# Patient Record
Sex: Male | Born: 1960 | Race: Black or African American | Hispanic: No | Marital: Single | Smoking: Never smoker
Health system: Southern US, Community
[De-identification: ages and names within clinical notes are randomized; demographics above are authoritative.]

## PROBLEM LIST (undated history)

## (undated) DIAGNOSIS — I10 Essential (primary) hypertension: Secondary | ICD-10-CM

## (undated) DIAGNOSIS — I2510 Atherosclerotic heart disease of native coronary artery without angina pectoris: Secondary | ICD-10-CM

## (undated) DIAGNOSIS — S83289A Other tear of lateral meniscus, current injury, unspecified knee, initial encounter: Secondary | ICD-10-CM

## (undated) DIAGNOSIS — M722 Plantar fascial fibromatosis: Secondary | ICD-10-CM

## (undated) DIAGNOSIS — E119 Type 2 diabetes mellitus without complications: Secondary | ICD-10-CM

## (undated) DIAGNOSIS — C801 Malignant (primary) neoplasm, unspecified: Secondary | ICD-10-CM

## (undated) DIAGNOSIS — I509 Heart failure, unspecified: Secondary | ICD-10-CM

## (undated) DIAGNOSIS — E785 Hyperlipidemia, unspecified: Secondary | ICD-10-CM

## (undated) HISTORY — PX: SHOULDER ARTHROSCOPY: SHX128

## (undated) HISTORY — PX: TESTICLE SURGERY: SHX794

## (undated) HISTORY — PX: ANGIOPLASTY: SHX39

## (undated) HISTORY — DX: Essential (primary) hypertension: I10

## (undated) HISTORY — DX: Hyperlipidemia, unspecified: E78.5

## (undated) HISTORY — DX: Heart failure, unspecified: I50.9

---

## 1998-09-16 ENCOUNTER — Emergency Department (HOSPITAL_COMMUNITY): Admission: EM | Admit: 1998-09-16 | Discharge: 1998-09-16 | Payer: Self-pay | Admitting: Emergency Medicine

## 1998-09-30 ENCOUNTER — Encounter: Payer: Self-pay | Admitting: Cardiology

## 1998-09-30 ENCOUNTER — Ambulatory Visit (HOSPITAL_COMMUNITY): Admission: RE | Admit: 1998-09-30 | Discharge: 1998-09-30 | Payer: Self-pay | Admitting: Cardiology

## 1998-10-06 ENCOUNTER — Ambulatory Visit (HOSPITAL_COMMUNITY): Admission: RE | Admit: 1998-10-06 | Discharge: 1998-10-06 | Payer: Self-pay | Admitting: Cardiology

## 1998-12-30 ENCOUNTER — Observation Stay (HOSPITAL_COMMUNITY): Admission: RE | Admit: 1998-12-30 | Discharge: 1998-12-31 | Payer: Self-pay | Admitting: Otolaryngology

## 1999-01-24 ENCOUNTER — Encounter: Payer: Self-pay | Admitting: Emergency Medicine

## 1999-01-24 ENCOUNTER — Emergency Department (HOSPITAL_COMMUNITY): Admission: EM | Admit: 1999-01-24 | Discharge: 1999-01-24 | Payer: Self-pay | Admitting: Emergency Medicine

## 1999-04-23 ENCOUNTER — Emergency Department (HOSPITAL_COMMUNITY): Admission: EM | Admit: 1999-04-23 | Discharge: 1999-04-23 | Payer: Self-pay | Admitting: Emergency Medicine

## 1999-04-23 ENCOUNTER — Encounter: Payer: Self-pay | Admitting: Emergency Medicine

## 2002-03-03 ENCOUNTER — Encounter: Payer: Self-pay | Admitting: Emergency Medicine

## 2002-03-03 ENCOUNTER — Emergency Department (HOSPITAL_COMMUNITY): Admission: EM | Admit: 2002-03-03 | Discharge: 2002-03-03 | Payer: Self-pay | Admitting: Emergency Medicine

## 2002-03-06 ENCOUNTER — Emergency Department (HOSPITAL_COMMUNITY): Admission: EM | Admit: 2002-03-06 | Discharge: 2002-03-06 | Payer: Self-pay | Admitting: Emergency Medicine

## 2002-03-30 ENCOUNTER — Ambulatory Visit (HOSPITAL_COMMUNITY): Admission: RE | Admit: 2002-03-30 | Discharge: 2002-03-30 | Payer: Self-pay | Admitting: Orthopedic Surgery

## 2002-03-30 ENCOUNTER — Encounter: Payer: Self-pay | Admitting: Orthopedic Surgery

## 2002-04-09 ENCOUNTER — Encounter: Payer: Self-pay | Admitting: Orthopedic Surgery

## 2002-04-11 ENCOUNTER — Inpatient Hospital Stay (HOSPITAL_COMMUNITY): Admission: AD | Admit: 2002-04-11 | Discharge: 2002-04-12 | Payer: Self-pay | Admitting: Orthopedic Surgery

## 2002-04-22 ENCOUNTER — Emergency Department (HOSPITAL_COMMUNITY): Admission: EM | Admit: 2002-04-22 | Discharge: 2002-04-22 | Payer: Self-pay | Admitting: Emergency Medicine

## 2002-06-04 ENCOUNTER — Encounter: Admission: RE | Admit: 2002-06-04 | Discharge: 2002-09-02 | Payer: Self-pay | Admitting: Orthopedic Surgery

## 2014-05-25 ENCOUNTER — Encounter (HOSPITAL_COMMUNITY): Payer: Self-pay | Admitting: Emergency Medicine

## 2014-05-25 ENCOUNTER — Emergency Department (INDEPENDENT_AMBULATORY_CARE_PROVIDER_SITE_OTHER)
Admission: EM | Admit: 2014-05-25 | Discharge: 2014-05-25 | Disposition: A | Discharge status: Home / Self Care | Payer: BC Managed Care – PPO | Source: Home / Self Care | Attending: Emergency Medicine | Admitting: Emergency Medicine

## 2014-05-25 DIAGNOSIS — K047 Periapical abscess without sinus: Secondary | ICD-10-CM

## 2014-05-25 DIAGNOSIS — K044 Acute apical periodontitis of pulpal origin: Secondary | ICD-10-CM

## 2014-05-25 DIAGNOSIS — K0889 Other specified disorders of teeth and supporting structures: Secondary | ICD-10-CM

## 2014-05-25 DIAGNOSIS — K089 Disorder of teeth and supporting structures, unspecified: Secondary | ICD-10-CM

## 2014-05-25 MED ORDER — CLINDAMYCIN HCL 300 MG PO CAPS: 300.0000 mg | ORAL_CAPSULE | Freq: Three times a day (TID) | ORAL | Status: DC

## 2014-05-25 MED ORDER — HYDROCODONE-ACETAMINOPHEN 5-325 MG PO TABS: 1.0000 | ORAL_TABLET | Freq: Four times a day (QID) | ORAL | Status: DC | PRN

## 2014-05-25 MED ORDER — IBUPROFEN 800 MG PO TABS: 800.0000 mg | ORAL_TABLET | Freq: Three times a day (TID) | ORAL | Status: DC

## 2014-05-25 NOTE — ED Notes (Signed)
C/o dental pain States right top tooth filling did fall out States tooth is sensitive to hot/cold  No appt with dentist States there is pressure  Motrin and numbing meds used as tx

## 2014-05-25 NOTE — ED Provider Notes (Signed)
CSN: [id number]     Arrival date & time 05/25/14  1001 History   None    Chief Complaint  Patient presents with  . Dental Pain   (Consider location/radiation/quality/duration/timing/severity/associated sxs/prior Treatment) HPI Comments: 53 year old male presents for evaluation of dental pain. A couple months ago he had a filling fall out of his tooth. 5 days ago he started to have pain around this tooth. In the last 5 days, the pain has gotten progressively worse and now radiates up to his ear and along his jaw. The pain is severe, 8-9/10 he has some swelling in his gums and cheek as well. He has not had any purulent drainage into his mouth. He does not have a dentist. No fever. He is able to open his mouth fully  Patient is a 53 y.o. male presenting with tooth pain.  Dental Pain Associated symptoms: facial swelling     History reviewed. No pertinent past medical history. No past surgical history on file. No family history on file. History  Substance Use Topics  . Smoking status: Not on file  . Smokeless tobacco: Not on file  . Alcohol Use: Not on file    Review of Systems  HENT: Positive for dental problem and facial swelling.   All other systems reviewed and are negative.   Allergies  Review of patient's allergies indicates no known allergies.  Home Medications   Prior to Admission medications   Medication Sig Start Date End Date Taking? Authorizing Provider  clindamycin (CLEOCIN) 300 MG capsule Take 1 capsule (300 mg total) by mouth 3 (three) times daily. 05/25/14   Liam Graham, PA-C  HYDROcodone-acetaminophen (NORCO) 5-325 MG per tablet Take 1 tablet by mouth every 6 (six) hours as needed for moderate pain. 05/25/14   Liam Graham, PA-C  ibuprofen (ADVIL,MOTRIN) 800 MG tablet Take 1 tablet (800 mg total) by mouth 3 (three) times daily. 05/25/14   Freeman Caldron Colie Josten, PA-C   BP 137/88  Pulse 87  Temp(Src) 98.5 F (36.9 C) (Oral)  Resp 18  SpO2 96% Physical Exam   Nursing note and vitals reviewed. Constitutional: He is oriented to person, place, and time. He appears well-developed and well-nourished. No distress.  HENT:  Head: Normocephalic.  Mouth/Throat: Uvula is midline, oropharynx is clear and moist and mucous membranes are normal. No trismus in the jaw. Dental caries (right upper jaw, multiple teeth, the gums are erythematous and swollen) present. No dental abscesses.  No sublingual edema  Pulmonary/Chest: Effort normal. No respiratory distress.  Neurological: He is alert and oriented to person, place, and time. Coordination normal.  Skin: Skin is warm and dry. No rash noted. He is not diaphoretic.  Psychiatric: He has a normal mood and affect. Judgment normal.    ED Course  Procedures (including critical care time) Labs Review Labs Reviewed - No data to display  Imaging Review No results found.   MDM   1. Toothache   2. Dental infection    We'll treat with clindamycin, ibuprofen, Norco. Followup with dentist   Meds ordered this encounter  Medications  . clindamycin (CLEOCIN) 300 MG capsule    Sig: Take 1 capsule (300 mg total) by mouth 3 (three) times daily.    Dispense:  42 capsule    Refill:  0    Order Specific Question:  Supervising Provider    Answer:  Jake Michaelis, DAVID C [id number]  . ibuprofen (ADVIL,MOTRIN) 800 MG tablet    Sig: Take 1 tablet (800 mg total)  by mouth 3 (three) times daily.    Dispense:  60 tablet    Refill:  0    Order Specific Question:  Supervising Provider    Answer:  Jake Michaelis, DAVID C [id number]  . HYDROcodone-acetaminophen (NORCO) 5-325 MG per tablet    Sig: Take 1 tablet by mouth every 6 (six) hours as needed for moderate pain.    Dispense:  15 tablet    Refill:  0    Order Specific Question:  Supervising Provider    Answer:  Jake Michaelis, DAVID C [[id number]]       Liam Graham, PA-C 05/25/14 1053

## 2014-05-25 NOTE — ED Provider Notes (Signed)
Medical screening examination/treatment/procedure(s) were performed by non-physician practitioner and as supervising physician I was immediately available for consultation/collaboration.  Philipp Deputy, M.D.  Harden Mo, MD 05/25/14 [phone]

## 2014-05-25 NOTE — Discharge Instructions (Signed)
Periodontal Disease Periodontal disease, or gum disease, is a type of oral disease that affects the surrounding and supporting tissues of the teeth. These include the gums (gingivae), ligaments, and tooth socket (alveolar bone). Periodontal disease can affect one tooth or many teeth. If left untreated, it may lead to tooth loss.  CAUSES The main cause of periodontal disease is dental plaque, which contains harmful bacteria. These bacteria can cause the gums to become inflamed and infected. Further progression of the disease can damage the other supporting tissues.  RISK FACTORS  Diabetes.   Smoking and tobacco use.   Genetics.   Hormonal changes of puberty, menopause, and pregnancy.   Stress.   Clenching or grinding your teeth.   Substance abuse.  Poor nutrition.   Diseases that interfere with the body's immune system.   Certain medicines. SIGNS AND SYMPTOMS  Red or swollen gums.  Bad breath that does not go away.  Gums that have pulled away from the teeth.  Gums that bleed easily.  Permanent teeth that are loose or separating.  Pain when chewing.  Changes in the way your teeth fit together.  Sensitive teeth. DIAGNOSIS  A thorough examination of the periodontal tissues will be done by your dentist. X-rays may be needed. Evaluation of your medical history will be needed to see if there are other factors or underlying conditions that may contribute to the disease. TREATMENT The number and types of treatment will vary depending on the extent of the disease. Treatment may include brushing and flossing only. Further disease progression may necessitate scaling and root planing or even surgery. The main goal is to control the infection. Good oral hygiene at home is necessary for the success of all types of treatment. HOME CARE INSTRUCTIONS   Practice good oral hygiene. This includes flossing and brushing your teeth every day.   See your dentist regularly, at least  2 times per year.   Stop smoking if you smoke.  Eat a well-balanced diet. SEEK IMMEDIATE DENTAL CARE IF:   You have any signs or symptoms of periodontal disease along with:  Swelling of your face, neck, or jaw.  Inability to open your mouth.  Severe pain uncontrolled by pain medicine.  You have a fever or persistent symptoms for more than 2 3 days.  You have a fever and your symptoms suddenly get worse. Document Released: 12/01/2003 Document Revised: 07/31/2013 Document Reviewed: 05/07/2013 [facility] Patient Information 2014 [place].  Dental Pain A tooth ache may be caused by cavities (tooth decay). Cavities expose the nerve of the tooth to air and hot or cold temperatures. It may come from an infection or abscess (also called a boil or furuncle) around your tooth. It is also often caused by dental caries (tooth decay). This causes the pain you are having. DIAGNOSIS  Your caregiver can diagnose this problem by exam. TREATMENT   If caused by an infection, it may be treated with medications which kill germs (antibiotics) and pain medications as prescribed by your caregiver. Take medications as directed.  Only take over-the-counter or prescription medicines for pain, discomfort, or fever as directed by your caregiver.  Whether the tooth ache today is caused by infection or dental disease, you should see your dentist as soon as possible for further care. SEEK MEDICAL CARE IF: The exam and treatment you received today has been provided on an emergency basis only. This is not a substitute for complete medical or dental care. If your problem worsens or new problems (  symptoms) appear, and you are unable to meet with your dentist, call or return to this location. SEEK IMMEDIATE MEDICAL CARE IF:   You have a fever.  You develop redness and swelling of your face, jaw, or neck.  You are unable to open your mouth.  You have severe pain uncontrolled by pain medicine. MAKE SURE  YOU:   Understand these instructions.  Will watch your condition.  Will get help right away if you are not doing well or get worse. Document Released: 11/28/2005 Document Revised: 02/20/2012 Document Reviewed: 07/16/2008 [facility] Patient Information 2014 [place].

## 2016-01-30 ENCOUNTER — Emergency Department (HOSPITAL_COMMUNITY): Payer: Self-pay

## 2016-01-30 ENCOUNTER — Encounter (HOSPITAL_COMMUNITY): Payer: Self-pay

## 2016-01-30 ENCOUNTER — Emergency Department (HOSPITAL_COMMUNITY)
Admission: EM | Admit: 2016-01-30 | Discharge: 2016-01-30 | Disposition: A | Discharge status: Home / Self Care | Payer: Self-pay | Attending: Emergency Medicine | Admitting: Emergency Medicine

## 2016-01-30 DIAGNOSIS — S46222A Laceration of muscle, fascia and tendon of other parts of biceps, left arm, initial encounter: Secondary | ICD-10-CM | POA: Insufficient documentation

## 2016-01-30 DIAGNOSIS — Z792 Long term (current) use of antibiotics: Secondary | ICD-10-CM | POA: Insufficient documentation

## 2016-01-30 DIAGNOSIS — Z9861 Coronary angioplasty status: Secondary | ICD-10-CM | POA: Insufficient documentation

## 2016-01-30 DIAGNOSIS — Z8571 Personal history of Hodgkin lymphoma: Secondary | ICD-10-CM | POA: Insufficient documentation

## 2016-01-30 DIAGNOSIS — Z23 Encounter for immunization: Secondary | ICD-10-CM | POA: Insufficient documentation

## 2016-01-30 DIAGNOSIS — I2510 Atherosclerotic heart disease of native coronary artery without angina pectoris: Secondary | ICD-10-CM | POA: Insufficient documentation

## 2016-01-30 DIAGNOSIS — Y9289 Other specified places as the place of occurrence of the external cause: Secondary | ICD-10-CM | POA: Insufficient documentation

## 2016-01-30 DIAGNOSIS — Z791 Long term (current) use of non-steroidal anti-inflammatories (NSAID): Secondary | ICD-10-CM | POA: Insufficient documentation

## 2016-01-30 DIAGNOSIS — W268XXA Contact with other sharp object(s), not elsewhere classified, initial encounter: Secondary | ICD-10-CM | POA: Insufficient documentation

## 2016-01-30 DIAGNOSIS — Y9389 Activity, other specified: Secondary | ICD-10-CM | POA: Insufficient documentation

## 2016-01-30 DIAGNOSIS — IMO0002 Reserved for concepts with insufficient information to code with codable children: Secondary | ICD-10-CM

## 2016-01-30 DIAGNOSIS — Y998 Other external cause status: Secondary | ICD-10-CM | POA: Insufficient documentation

## 2016-01-30 DIAGNOSIS — Z8739 Personal history of other diseases of the musculoskeletal system and connective tissue: Secondary | ICD-10-CM | POA: Insufficient documentation

## 2016-01-30 DIAGNOSIS — Z859 Personal history of malignant neoplasm, unspecified: Secondary | ICD-10-CM | POA: Insufficient documentation

## 2016-01-30 HISTORY — DX: Plantar fascial fibromatosis: M72.2

## 2016-01-30 HISTORY — DX: Atherosclerotic heart disease of native coronary artery without angina pectoris: I25.10

## 2016-01-30 HISTORY — DX: Malignant (primary) neoplasm, unspecified: C80.1

## 2016-01-30 MED ORDER — TETANUS-DIPHTH-ACELL PERTUSSIS 5-2.5-18.5 LF-MCG/0.5 IM SUSP
0.5000 mL | Freq: Once | INTRAMUSCULAR | Status: AC
  Administered 2016-01-30: 0.5 mL via INTRAMUSCULAR
  Filled 2016-01-30: qty 0.5

## 2016-01-30 MED ORDER — LIDOCAINE-EPINEPHRINE (PF) 2 %-1:200000 IJ SOLN: 10.0000 mL | Freq: Once | INTRAMUSCULAR | Status: DC

## 2016-01-30 MED ORDER — LIDOCAINE-EPINEPHRINE 2 %-1:100000 IJ SOLN
20.0000 mL | Freq: Once | INTRAMUSCULAR | Status: AC
  Administered 2016-01-30: 20 mL
  Filled 2016-01-30: qty 20

## 2016-01-30 NOTE — Discharge Instructions (Signed)
Your sutures will dissolve on their own over time. Return to ED for any new or concerning symptoms such as increased redness, tenderness, swelling or cloudy drainage.

## 2016-01-30 NOTE — ED Notes (Signed)
Declined W/C at D/C and was escorted to lobby by RN. 

## 2016-01-30 NOTE — ED Provider Notes (Signed)
CSN: [id number]     Arrival date & time 01/30/16  1010 History  By signing my name below, I, Andrew Brock, attest that this documentation has been prepared under the direction and in the presence of [employer], PA-C. Electronically Signed: Rayna Brock, ED Scribe. 01/30/2016. 11:42 AM.    Chief Complaint  Patient presents with  . Laceration   The history is provided by the patient. No language interpreter was used.   HPI Comments: Andrew Brock is a 55 y.o. male who presents to the Emergency Department complaining of a moderate laceration with controlled bleeding to the medial aspect of the left bicep which occurred this morning. Pt was sitting in a lawn chair which broke resulting in the laceration but is unsure if the metal or plastic portion of the chair caused the laceration. He confirms a PMHx of Hodgkin's Lymphoma in 2005. His TDAP is not UTD. No fevers, chills, numbness or weakness or decreased range of motion. He denies any other associated symptoms at this time.    Past Medical History  Diagnosis Date  . Cancer ([place])   . Plantar fasciitis   . Coronary artery disease    Past Surgical History  Procedure Laterality Date  . Angioplasty     No family history on file. Social History  Substance Use Topics  . Smoking status: Never Smoker   . Smokeless tobacco: None  . Alcohol Use: No    Review of Systems A complete 10 system review of systems was obtained and all systems are negative except as noted in the HPI and PMH.   Allergies  Review of patient's allergies indicates no known allergies.  Home Medications   Prior to Admission medications   Medication Sig Start Date End Date Taking? Authorizing Provider  clindamycin (CLEOCIN) 300 MG capsule Take 1 capsule (300 mg total) by mouth 3 (three) times daily. 05/25/14   Liam Graham, PA-C  HYDROcodone-acetaminophen (NORCO) 5-325 MG per tablet Take 1 tablet by mouth every 6 (six) hours as needed for moderate pain.  05/25/14   Liam Graham, PA-C  ibuprofen (ADVIL,MOTRIN) 800 MG tablet Take 1 tablet (800 mg total) by mouth 3 (three) times daily. 05/25/14   Freeman Caldron Baker, PA-C   BP 135/92 mmHg  Pulse 62  Temp(Src) 98 F (36.7 C) (Oral)  Resp 18  Wt 138.801 kg  SpO2 98%    Physical Exam  Constitutional: He is oriented to person, place, and time. He appears well-developed and well-nourished.  HENT:  Head: Normocephalic and atraumatic.  Eyes: EOM are normal.  Neck: Normal range of motion.  Cardiovascular: Normal rate.   Pulmonary/Chest: Effort normal. No respiratory distress.  Abdominal: Soft.  Musculoskeletal: Normal range of motion.  Neurological: He is alert and oriented to person, place, and time.  Skin: Skin is warm and dry. Laceration noted.  Medial aspect of LUE in the axillary fold: transverse laceration; approximately 2 inches in length with exposed adipose tissue but no other bony, tendonous or vascular involvement   Psychiatric: He has a normal mood and affect.  Nursing note and vitals reviewed.   ED Course  Procedures  DIAGNOSTIC STUDIES: Oxygen Saturation is 98% on RA, normal by my interpretation.    COORDINATION OF CARE: 10:45 AM Discussed next steps with pt. He verbalized understanding and is agreeable with the plan.  LACERATION REPAIR PROCEDURE NOTE The patient's identification was confirmed and consent was obtained. This procedure was performed by Comer Locket, PA-C at 12:15 PM. Site:  medial aspect of LUE in the axillary fold Sterile procedures observed Anesthetic used (type and amt): 2% lidocaine w/epinephrine; 8 mL Suture type/size: 4-0 Vicryl Length: 2 inches # of Sutures: 6 Technique: simple interrupted  Complexity: complex Antibx ointment applied Tetanus ordered Site anesthetized, irrigated with NS, explored without evidence of foreign body, wound well approximated, site covered with dry, sterile dressing.  Patient tolerated procedure well without  complications. Instructions for care discussed verbally and patient provided with additional written instructions for homecare and f/u.   Labs Review Labs Reviewed - No data to display  Imaging Review Dg Humerus Left  01/30/2016  CLINICAL DATA:  Patient was sitting on plastic lawn chair when it broke and the arm part of the chair caused a deep laceration in the middle part of his upper arm EXAM: LEFT HUMERUS - 2+ VIEW COMPARISON:  None. FINDINGS: No fracture.  No bone lesion. Shoulder and elbow joints are normally aligned. There has been a previous rotator cuff repair. No radiopaque foreign body. IMPRESSION: No fracture or bone lesion. No dislocation. No radiopaque foreign body. Electronically Signed   By: Lajean Manes M.D.   On: 01/30/2016 11:26   I have personally reviewed and evaluated these images as part of my medical decision-making.   EKG Interpretation None      MDM  Tdap booster given.Pressure irrigation performed. Laceration occurred < 8 hours prior to repair which was well tolerated. Pt has no co morbidities to effect normal wound healing. Discussed suture home care w pt and answered questions. Pt to f-u for wound check in 7 days. Pt is hemodynamically stable w no complaints prior to dc.    Final diagnoses:  Laceration    I personally performed the services described in this documentation, which was scribed in my presence. The recorded information has been reviewed and is accurate.    Comer Locket, PA-C 01/30/16 1314  Gareth Morgan, MD 02/01/16 2256

## 2016-01-30 NOTE — ED Notes (Signed)
Pt. Was siting on a lawn chair and it bokre and the plastic arm rest cut the pt. In his r lt. Upper anteror arm laceration noted, bleeding controlled

## 2017-02-07 ENCOUNTER — Emergency Department (HOSPITAL_COMMUNITY)
Admission: EM | Admit: 2017-02-07 | Discharge: 2017-02-07 | Disposition: A | Discharge status: Home / Self Care | Payer: Self-pay | Attending: Emergency Medicine | Admitting: Emergency Medicine

## 2017-02-07 DIAGNOSIS — S39012A Strain of muscle, fascia and tendon of lower back, initial encounter: Secondary | ICD-10-CM

## 2017-02-07 DIAGNOSIS — Y990 Civilian activity done for income or pay: Secondary | ICD-10-CM | POA: Insufficient documentation

## 2017-02-07 DIAGNOSIS — X500XXA Overexertion from strenuous movement or load, initial encounter: Secondary | ICD-10-CM | POA: Insufficient documentation

## 2017-02-07 DIAGNOSIS — Y929 Unspecified place or not applicable: Secondary | ICD-10-CM | POA: Insufficient documentation

## 2017-02-07 DIAGNOSIS — S29012A Strain of muscle and tendon of back wall of thorax, initial encounter: Secondary | ICD-10-CM | POA: Insufficient documentation

## 2017-02-07 DIAGNOSIS — Z859 Personal history of malignant neoplasm, unspecified: Secondary | ICD-10-CM | POA: Insufficient documentation

## 2017-02-07 DIAGNOSIS — I2510 Atherosclerotic heart disease of native coronary artery without angina pectoris: Secondary | ICD-10-CM | POA: Insufficient documentation

## 2017-02-07 DIAGNOSIS — Y9389 Activity, other specified: Secondary | ICD-10-CM | POA: Insufficient documentation

## 2017-02-07 MED ORDER — METHOCARBAMOL 500 MG PO TABS: 1000.0000 mg | ORAL_TABLET | Freq: Four times a day (QID) | ORAL | 0 refills | Status: DC

## 2017-02-07 MED ORDER — KETOROLAC TROMETHAMINE 60 MG/2ML IM SOLN
60.0000 mg | Freq: Once | INTRAMUSCULAR | Status: AC
  Administered 2017-02-07: 60 mg via INTRAMUSCULAR
  Filled 2017-02-07: qty 2

## 2017-02-07 MED ORDER — METHOCARBAMOL 500 MG PO TABS
500.0000 mg | ORAL_TABLET | Freq: Once | ORAL | Status: AC
  Administered 2017-02-07: 500 mg via ORAL
  Filled 2017-02-07: qty 1

## 2017-02-07 NOTE — ED Notes (Signed)
Bed: [id number] Expected date:  Expected time:  Means of arrival:  Comments: EMS- 56yo M, back pain

## 2017-02-07 NOTE — ED Notes (Signed)
Pt ambulatory to bathroom

## 2017-02-07 NOTE — Discharge Instructions (Signed)
Please read and follow all provided instructions.  Your diagnoses today include:  1. Back strain, initial encounter     Tests performed today include:  Vital signs - see below for your results today  Medications prescribed:   Robaxin (methocarbamol) - muscle relaxer medication  DO NOT drive or perform any activities that require you to be awake and alert because this medicine can make you drowsy.   Take any prescribed medications only as directed.  Home care instructions:   Follow any educational materials contained in this packet  Please rest, use ice or heat on your back for the next several days  Do not lift, push, pull anything more than 10 pounds for the next week  Follow-up instructions: Please follow-up with your primary care provider in the next 1 week for further evaluation of your symptoms.   Return instructions:  SEEK IMMEDIATE MEDICAL ATTENTION IF YOU HAVE:  New numbness, tingling, weakness, or problem with the use of your arms or legs  Severe back pain not relieved with medications  Loss control of your bowels or bladder  Increasing pain in any areas of the body (such as chest or abdominal pain)  Shortness of breath, dizziness, or fainting.   Worsening nausea (feeling sick to your stomach), vomiting, fever, or sweats  Any other emergent concerns regarding your health   Additional Information:  Your vital signs today were: BP 137/93 (BP Location: Left Arm)    Pulse 73    Temp 97.8 F (36.6 C) (Oral)    Resp 17    Ht 5\' 11"  (1.803 m)    Wt (!) 139.7 kg    SpO2 94%    BMI 42.96 kg/m  If your blood pressure (BP) was elevated above 135/85 this visit, please have this repeated by your doctor within one month. --------------

## 2017-02-07 NOTE — ED Triage Notes (Signed)
BIB EMS from Work, reporting 10/10 left lower back pain that started while walking. Pt denies recent injury. Pt denies performing strenuous activity during this incident. Pt denies hx of back surgeries. Pt able to stand and pivot from EMS gurney to ED stretcher. Arrives A+OX4, speaking in complete sentences.   EMS Vitals BP 132/94 P 76 RR 20 SPO2 99%

## 2017-02-07 NOTE — ED Provider Notes (Signed)
[facility] DEPT Provider Note   CSN: [id number] Arrival date & time: 02/07/17  0845     History   Chief Complaint Chief Complaint  Patient presents with  . Back Pain    HPI Andrew Brock is a 56 y.o. male.  Patient presents from work with reported severe middle back pain that started acutely while walking. Patient denies history of back surgeries or other problems but states that he had a back strain in his 42s. He coaches boxing at night. He does a lot of lifting at work but nothing over 22 pounds at a time. He also drives a forklift. Pain does not radiate. It is in his left middle back wrapping around to his rib cage. No difficulty breathing or shortness of breath. No cough or fever. Patient denies warning symptoms of back pain including: fecal incontinence, urinary retention or overflow incontinence, night sweats, waking from sleep with back pain, unexplained fevers or weight loss, h/o cancer, IVDU, recent trauma. Transported to the emergency department by EMS. No treatments prior to arrival.      Past Medical History:  Diagnosis Date  . Cancer ([place])   . Coronary artery disease   . Plantar fasciitis     There are no active problems to display for this patient.   Past Surgical History:  Procedure Laterality Date  . ANGIOPLASTY         Home Medications    Prior to Admission medications   Medication Sig Start Date End Date Taking? Authorizing Provider  clindamycin (CLEOCIN) 300 MG capsule Take 1 capsule (300 mg total) by mouth 3 (three) times daily. 05/25/14   Liam Graham, PA-C  HYDROcodone-acetaminophen (NORCO) 5-325 MG per tablet Take 1 tablet by mouth every 6 (six) hours as needed for moderate pain. 05/25/14   Liam Graham, PA-C  ibuprofen (ADVIL,MOTRIN) 800 MG tablet Take 1 tablet (800 mg total) by mouth 3 (three) times daily. 05/25/14   Liam Graham, PA-C    Family History No family history on file.  Social History Social History  Substance  Use Topics  . Smoking status: Never Smoker  . Smokeless tobacco: Not on file  . Alcohol use No     Allergies   Patient has no known allergies.   Review of Systems Review of Systems  Constitutional: Negative for fever and unexpected weight change.  Gastrointestinal: Negative for constipation.       Neg for fecal incontinence  Genitourinary: Negative for difficulty urinating, flank pain and hematuria.       Negative for urinary incontinence or retention  Musculoskeletal: Positive for back pain.  Neurological: Negative for weakness and numbness.       Negative for saddle paresthesias      Physical Exam Updated Vital Signs BP 137/93 (BP Location: Left Arm)   Pulse 73   Temp 97.8 F (36.6 C) (Oral)   Resp 17   Ht 5\' 11"  (1.803 m)   Wt (!) 139.7 kg   SpO2 94%   BMI 42.96 kg/m   Physical Exam  Constitutional: He appears well-developed and well-nourished.  HENT:  Head: Normocephalic and atraumatic.  Eyes: Conjunctivae are normal.  Neck: Normal range of motion.  Abdominal: Soft. There is no tenderness. There is no CVA tenderness.  Musculoskeletal:       Cervical back: He exhibits normal range of motion, no tenderness and no bony tenderness.       Thoracic back: He exhibits decreased range of motion, tenderness, pain and  spasm. He exhibits no bony tenderness.       Lumbar back: He exhibits tenderness. He exhibits normal range of motion and no bony tenderness.       Back:  No step-off noted with palpation of spine.   Neurological: He is alert. He has normal reflexes. No sensory deficit. He exhibits normal muscle tone.  5/5 strength in entire lower extremities bilaterally. No sensation deficit.   Skin: Skin is warm and dry.  Psychiatric: He has a normal mood and affect.  Nursing note and vitals reviewed.    ED Treatments / Results   Procedures Procedures (including critical care time)  Medications Ordered in ED Medications  ketorolac (TORADOL) injection 60 mg (60  mg Intramuscular Given 02/07/17 0912)  methocarbamol (ROBAXIN) tablet 500 mg (500 mg Oral Given 02/07/17 0912)     Initial Impression / Assessment and Plan / ED Course  I have reviewed the triage vital signs and the nursing notes.  Pertinent labs & imaging results that were available during my care of the patient were reviewed by me and considered in my medical decision making (see chart for details).     9:09 AM Patient seen and examined. Work-up initiated. Medications ordered.   Vital signs reviewed and are as follows: Vitals:   02/07/17 0840  BP: 137/93  Pulse: 73  Resp: 17  Temp: 97.8 F (36.6 C)    10:32 AM Patient Doing well after medications  No red flag s/s of low back pain. Patient was counseled on back pain precautions and told to do activity as tolerated but do not lift, push, or pull heavy objects more than 10 pounds for the next week.  Patient counseled to use ice or heat on back for no longer than 15 minutes every hour.   Patient counseled on proper use of muscle relaxant medication. They were told not to drink alcohol, drive any vehicle, or do any dangerous activities while taking this medication. Patient verbalized understanding.  Patient urged to follow-up with PCP if pain does not improve with treatment and rest or if pain becomes recurrent. Urged to return with worsening severe pain, loss of bowel or bladder control, trouble walking.   The patient verbalizes understanding and agrees with the plan.    Final Clinical Impressions(s) / ED Diagnoses   Final diagnoses:  Back strain, initial encounter   Patient with mid-back pain without radicular features. No neurological deficits. Patient is ambulatory. No warning symptoms of back pain including: fecal incontinence, urinary retention or overflow incontinence, night sweats, waking from sleep with back pain, unexplained fevers or weight loss, h/o cancer, IVDU, recent trauma. No concern for cauda equina, epidural  abscess, or other serious cause of back pain. Conservative measures such as rest, ice/heat and pain medicine indicated with PCP follow-up if no improvement with conservative management.    New Prescriptions Discharge Medication List as of 02/07/2017 10:16 AM    START taking these medications   Details  methocarbamol (ROBAXIN) 500 MG tablet Take 2 tablets (1,000 mg total) by mouth 4 (four) times daily., Starting Tue 02/07/2017, Print         Carlisle Cater, PA-C 02/07/17 [place], MD 02/07/17 [phone]

## 2017-02-15 ENCOUNTER — Encounter (HOSPITAL_COMMUNITY): Payer: Self-pay

## 2017-02-15 ENCOUNTER — Emergency Department (HOSPITAL_COMMUNITY)
Admission: EM | Admit: 2017-02-15 | Discharge: 2017-02-15 | Disposition: A | Discharge status: Home / Self Care | Payer: BLUE CROSS/BLUE SHIELD | Attending: Emergency Medicine | Admitting: Emergency Medicine

## 2017-02-15 DIAGNOSIS — I2510 Atherosclerotic heart disease of native coronary artery without angina pectoris: Secondary | ICD-10-CM | POA: Diagnosis not present

## 2017-02-15 DIAGNOSIS — X500XXD Overexertion from strenuous movement or load, subsequent encounter: Secondary | ICD-10-CM | POA: Insufficient documentation

## 2017-02-15 DIAGNOSIS — Z79899 Other long term (current) drug therapy: Secondary | ICD-10-CM | POA: Diagnosis not present

## 2017-02-15 DIAGNOSIS — S39012D Strain of muscle, fascia and tendon of lower back, subsequent encounter: Secondary | ICD-10-CM | POA: Insufficient documentation

## 2017-02-15 DIAGNOSIS — S3992XD Unspecified injury of lower back, subsequent encounter: Secondary | ICD-10-CM | POA: Diagnosis present

## 2017-02-15 DIAGNOSIS — Z859 Personal history of malignant neoplasm, unspecified: Secondary | ICD-10-CM | POA: Insufficient documentation

## 2017-02-15 NOTE — ED Triage Notes (Addendum)
Pt seen on 2/27 for back pain.  Pulled it at work.  Given muscle relaxers.  Pain has continued but minimal.  Job is requiring paperwork to go back to work tomorrow.

## 2017-02-15 NOTE — ED Provider Notes (Signed)
[facility] DEPT Provider Note   CSN: [id number] Arrival date & time: 02/15/17  1120  By signing my name below, I, Ethelle Lyon Long, attest that this documentation has been prepared under the direction and in the presence of Rupinder Livingston B. [place], PA-C. Electronically Signed: Ethelle Lyon Long, Scribe. 02/15/2017. 12:12 PM. . History   Chief Complaint Chief Complaint  Patient presents with  . Back Pain   The history is provided by the patient. No language interpreter was used.    HPI Comments:  AVROM ROBARTS is an obese 56 y.o. male with a PMHx of CA, Plantar Fascitis, and CAD, who presents to the Emergency Department complaining of constant, 1- 2/10, right-sided back pain onset eight days ago s/p pulling it at work. He states the original injury occurred spontaneously while walking around at work with the pain radiating into his right leg. Pt reports the muscle relaxants and pain medication helped and is mostly resolved, only minimal pain currently. He is seen at the ED today requesting examination and paper work so that he may return to work tomorrow. No acute injuries or trauma noted.  He tried ibuprofen at home with mild relief of his pain. Pt denies fever, arthralgias, rash, abdominal pain, nausea, vomiting, constipation, urgency, frequency, hematuria, dysuria, difficulty urinating, incontinence of bladder/bowel, and any other complaints at this time.   Past Medical History:  Diagnosis Date  . Cancer ([place])   . Coronary artery disease   . Plantar fasciitis     There are no active problems to display for this patient.   Past Surgical History:  Procedure Laterality Date  . ANGIOPLASTY      Home Medications    Prior to Admission medications   Medication Sig Start Date End Date Taking? Authorizing Provider  clindamycin (CLEOCIN) 300 MG capsule Take 1 capsule (300 mg total) by mouth 3 (three) times daily. 05/25/14   Liam Graham, PA-C  HYDROcodone-acetaminophen (NORCO) 5-325 MG per  tablet Take 1 tablet by mouth every 6 (six) hours as needed for moderate pain. 05/25/14   Liam Graham, PA-C  ibuprofen (ADVIL,MOTRIN) 800 MG tablet Take 1 tablet (800 mg total) by mouth 3 (three) times daily. 05/25/14   Liam Graham, PA-C  methocarbamol (ROBAXIN) 500 MG tablet Take 2 tablets (1,000 mg total) by mouth 4 (four) times daily. 02/07/17   Carlisle Cater, PA-C    Family History History reviewed. No pertinent family history.  Social History Social History  Substance Use Topics  . Smoking status: Never Smoker  . Smokeless tobacco: Never Used  . Alcohol use No     Allergies   Patient has no known allergies.   Review of Systems Review of Systems  Constitutional: Negative for fever.  Gastrointestinal: Negative for abdominal pain, constipation, diarrhea, nausea and vomiting.  Genitourinary: Negative for difficulty urinating, dysuria, frequency, hematuria and urgency.       Negative incontinence of bladder/bowel  Musculoskeletal: Positive for back pain. Negative for arthralgias.  Skin: Negative for rash.  Neurological: Positive for numbness (mild).     Physical Exam Updated Vital Signs BP 157/99 (BP Location: Right Arm)   Pulse 71   Temp 97.3 F (36.3 C) (Oral)   Resp 18   SpO2 99%   Physical Exam  Constitutional: He appears well-developed and well-nourished. No distress.  HENT:  Head: Normocephalic and atraumatic.  Neck: Neck supple.  Pulmonary/Chest: Effort normal.  Abdominal: Soft. He exhibits no distension. There is no tenderness. There is no rebound  and no guarding.  Musculoskeletal:  No rash noted. No significant tenderness of left thoracic or left lumbar back.  Spine nontender, no crepitus, or stepoffs. Lower extremities:  Strength 5/5, sensation intact, distal pulses intact.    Neurological: He is alert.  Skin: No rash noted. He is not diaphoretic.  Nursing note and vitals reviewed.    ED Treatments / Results  DIAGNOSTIC STUDIES:  Oxygen  Saturation is 99% on RA, normal by my interpretation.    COORDINATION OF CARE:  12:10 PM Discussed treatment plan with pt at bedside including filling out his work note and pt agreed to plan.  Labs (all labs ordered are listed, but only abnormal results are displayed) Labs Reviewed - No data to display  EKG  EKG Interpretation None       Radiology No results found.  Procedures Procedures (including critical care time)  Medications Ordered in ED Medications - No data to display   Initial Impression / Assessment and Plan / ED Course  I have reviewed the triage vital signs and the nursing notes.  Pertinent labs & imaging results that were available during my care of the patient were reviewed by me and considered in my medical decision making (see chart for details).     Afebrile, nontoxic patient with resolving muscular strain in his left thoracic back.  He is back to full activity in his regular life and needs a note reflecting this for work.  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  He feels capable of performing this now.  He has a normal workup, neurovascularly intact, gait is normal.  D/C home.  PCP follow up PRN.  Discussed result, findings, treatment, and follow up  with patient.  Pt given return precautions.  Pt verbalizes understanding and agrees with plan.      Final Clinical Impressions(s) / ED Diagnoses   Final diagnoses:  Back strain, subsequent encounter    New Prescriptions Discharge Medication List as of 02/15/2017 12:18 PM     I personally performed the services described in this documentation, which was scribed in my presence. The recorded information has been reviewed and is accurate.     Clayton Bibles, PA-C 02/15/17 1633    Carmin Muskrat, MD 02/16/17 2111

## 2017-02-15 NOTE — Discharge Instructions (Signed)
Read the information below.  You may return to the Emergency Department at any time for worsening condition or any new symptoms that concern you.     If you develop fevers, worsening back pain, loss of control of bowel or bladder, weakness or numbness in your legs, or are unable to walk, return to the ER for a recheck.

## 2018-07-17 ENCOUNTER — Other Ambulatory Visit: Payer: Self-pay

## 2018-07-17 ENCOUNTER — Encounter (HOSPITAL_BASED_OUTPATIENT_CLINIC_OR_DEPARTMENT_OTHER): Payer: Self-pay | Admitting: *Deleted

## 2018-07-18 NOTE — H&P (Signed)
[facility] [address] [place], [place] [address] [phone]  A Division of [facility]  RE: [place], [place]                                  [id number]         DOB: January 23, 1961 INITIAL EVALUATION 07/13/2018  Reason for visit:  Right knee injury.   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  Since then he has had pain with weightbearing, difficulty flexing his knee, causing a sharp pain medially.  He has not noted any instability.    OBJECTIVE: The patient is a well appearing male, in no apparent distress.  The right lower extremity has a firm end point on Lachman testing.  Effusion and tenderness at his posteromedial knee.    IMAGES: I reviewed his MRI, which does demonstrate either a partial sprain or old sprain to the ACL and MCL, but primarily posteromedial and posterolateral meniscus tears.  Lateral is actually mid body.  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  ACL sprain, but intact.  We discussed our options at this time.  I recommend arthroscopic partial medial and lateral meniscectomy, chondroplasty.  We will assess his ACL.  He has not noted any instability, so even if partially torn I would hold off on reconstructing it until he actually has symptomatic instability or loss of an end point.      Ernesta Amble.  Percell Miller, M.D.  Electronically verified by Ernesta Amble. Percell Miller, M.D. TDM:pmw D 07/13/18 T 07/17/18

## 2018-07-20 ENCOUNTER — Ambulatory Visit (HOSPITAL_BASED_OUTPATIENT_CLINIC_OR_DEPARTMENT_OTHER): Payer: Commercial Managed Care - PPO | Admitting: Anesthesiology

## 2018-07-20 ENCOUNTER — Other Ambulatory Visit: Payer: Self-pay

## 2018-07-20 ENCOUNTER — Ambulatory Visit (HOSPITAL_BASED_OUTPATIENT_CLINIC_OR_DEPARTMENT_OTHER)
Admission: RE | Admit: 2018-07-20 | Discharge: 2018-07-20 | Disposition: A | Discharge status: Home / Self Care | Payer: Commercial Managed Care - PPO | Source: Ambulatory Visit | Attending: Orthopedic Surgery | Admitting: Orthopedic Surgery

## 2018-07-20 ENCOUNTER — Encounter (HOSPITAL_BASED_OUTPATIENT_CLINIC_OR_DEPARTMENT_OTHER)
Admission: RE | Disposition: A | Discharge status: Home / Self Care | Payer: Self-pay | Source: Ambulatory Visit | Attending: Orthopedic Surgery

## 2018-07-20 ENCOUNTER — Encounter (HOSPITAL_BASED_OUTPATIENT_CLINIC_OR_DEPARTMENT_OTHER): Payer: Self-pay | Admitting: Certified Registered"

## 2018-07-20 DIAGNOSIS — S83281A Other tear of lateral meniscus, current injury, right knee, initial encounter: Secondary | ICD-10-CM | POA: Insufficient documentation

## 2018-07-20 DIAGNOSIS — S83241A Other tear of medial meniscus, current injury, right knee, initial encounter: Secondary | ICD-10-CM | POA: Insufficient documentation

## 2018-07-20 DIAGNOSIS — Y9371 Activity, boxing: Secondary | ICD-10-CM | POA: Insufficient documentation

## 2018-07-20 DIAGNOSIS — X501XXA Overexertion from prolonged static or awkward postures, initial encounter: Secondary | ICD-10-CM | POA: Diagnosis not present

## 2018-07-20 DIAGNOSIS — Z6841 Body Mass Index (BMI) 40.0 and over, adult: Secondary | ICD-10-CM | POA: Insufficient documentation

## 2018-07-20 DIAGNOSIS — M94261 Chondromalacia, right knee: Secondary | ICD-10-CM | POA: Diagnosis not present

## 2018-07-20 HISTORY — DX: Other tear of lateral meniscus, current injury, unspecified knee, initial encounter: S83.289A

## 2018-07-20 HISTORY — PX: KNEE ARTHROSCOPY WITH MEDIAL MENISECTOMY: SHX5651

## 2018-07-20 SURGERY — ARTHROSCOPY, KNEE, WITH MEDIAL MENISCECTOMY
Anesthesia: General | Site: Knee | Laterality: Right

## 2018-07-20 MED ORDER — ACETAMINOPHEN 500 MG PO TABS
1000.0000 mg | ORAL_TABLET | Freq: Once | ORAL | Status: AC
  Administered 2018-07-20: 1000 mg via ORAL

## 2018-07-20 MED ORDER — LIDOCAINE 2% (20 MG/ML) 5 ML SYRINGE
INTRAMUSCULAR | Status: DC | PRN
  Administered 2018-07-20: 100 mg via INTRAVENOUS

## 2018-07-20 MED ORDER — HYDROCODONE-ACETAMINOPHEN 5-325 MG PO TABS: 1.0000 | ORAL_TABLET | Freq: Four times a day (QID) | ORAL | 0 refills | Status: DC | PRN

## 2018-07-20 MED ORDER — MIDAZOLAM HCL 2 MG/2ML IJ SOLN
INTRAMUSCULAR | Status: AC
  Filled 2018-07-20: qty 2

## 2018-07-20 MED ORDER — CEFAZOLIN SODIUM-DEXTROSE 2-4 GM/100ML-% IV SOLN: 2.0000 g | INTRAVENOUS | Status: DC

## 2018-07-20 MED ORDER — ASPIRIN 81 MG PO TABS: 81.0000 mg | ORAL_TABLET | Freq: Every day | ORAL | 0 refills | Status: DC

## 2018-07-20 MED ORDER — LACTATED RINGERS IV SOLN
INTRAVENOUS | Status: DC
  Administered 2018-07-20: 11:00:00 via INTRAVENOUS

## 2018-07-20 MED ORDER — GABAPENTIN 300 MG PO CAPS
ORAL_CAPSULE | ORAL | Status: AC
  Filled 2018-07-20: qty 1

## 2018-07-20 MED ORDER — FENTANYL CITRATE (PF) 100 MCG/2ML IJ SOLN
INTRAMUSCULAR | Status: AC
  Filled 2018-07-20: qty 2

## 2018-07-20 MED ORDER — KETOROLAC TROMETHAMINE 30 MG/ML IJ SOLN
INTRAMUSCULAR | Status: DC | PRN
  Administered 2018-07-20: 30 mg via INTRAVENOUS

## 2018-07-20 MED ORDER — ONDANSETRON HCL 4 MG PO TABS: 4.0000 mg | ORAL_TABLET | Freq: Three times a day (TID) | ORAL | 0 refills | Status: DC | PRN

## 2018-07-20 MED ORDER — DEXTROSE 5 % IV SOLN
INTRAVENOUS | Status: DC | PRN
  Administered 2018-07-20: 3 g via INTRAVENOUS

## 2018-07-20 MED ORDER — CEFAZOLIN SODIUM-DEXTROSE 1-4 GM/50ML-% IV SOLN
INTRAVENOUS | Status: AC
  Filled 2018-07-20: qty 50

## 2018-07-20 MED ORDER — SCOPOLAMINE 1 MG/3DAYS TD PT72: 1.0000 | MEDICATED_PATCH | Freq: Once | TRANSDERMAL | Status: DC | PRN

## 2018-07-20 MED ORDER — CEFAZOLIN SODIUM-DEXTROSE 2-4 GM/100ML-% IV SOLN
INTRAVENOUS | Status: AC
  Filled 2018-07-20: qty 100

## 2018-07-20 MED ORDER — DEXAMETHASONE SODIUM PHOSPHATE 4 MG/ML IJ SOLN
INTRAMUSCULAR | Status: DC | PRN
  Administered 2018-07-20: 10 mg via INTRAVENOUS

## 2018-07-20 MED ORDER — METHYLPREDNISOLONE ACETATE 80 MG/ML IJ SUSP
INTRAMUSCULAR | Status: AC
  Filled 2018-07-20: qty 1

## 2018-07-20 MED ORDER — FENTANYL CITRATE (PF) 100 MCG/2ML IJ SOLN
25.0000 ug | INTRAMUSCULAR | Status: DC | PRN
  Administered 2018-07-20 (×2): 50 ug via INTRAVENOUS

## 2018-07-20 MED ORDER — LACTATED RINGERS IV SOLN
INTRAVENOUS | Status: DC
  Administered 2018-07-20: 14:00:00 via INTRAVENOUS

## 2018-07-20 MED ORDER — ONDANSETRON HCL 4 MG/2ML IJ SOLN
INTRAMUSCULAR | Status: DC | PRN
  Administered 2018-07-20: 4 mg via INTRAVENOUS

## 2018-07-20 MED ORDER — CHLORHEXIDINE GLUCONATE 4 % EX LIQD: 60.0000 mL | Freq: Once | CUTANEOUS | Status: DC

## 2018-07-20 MED ORDER — FENTANYL CITRATE (PF) 100 MCG/2ML IJ SOLN
50.0000 ug | INTRAMUSCULAR | Status: DC | PRN
  Administered 2018-07-20: 100 ug via INTRAVENOUS
  Administered 2018-07-20: 50 ug via INTRAVENOUS

## 2018-07-20 MED ORDER — ACETAMINOPHEN 500 MG PO TABS
ORAL_TABLET | ORAL | Status: AC
  Filled 2018-07-20: qty 2

## 2018-07-20 MED ORDER — OXYCODONE HCL 5 MG/5ML PO SOLN: 5.0000 mg | Freq: Once | ORAL | Status: DC | PRN

## 2018-07-20 MED ORDER — OXYCODONE HCL 5 MG PO TABS: 5.0000 mg | ORAL_TABLET | Freq: Once | ORAL | Status: DC | PRN

## 2018-07-20 MED ORDER — MIDAZOLAM HCL 2 MG/2ML IJ SOLN
1.0000 mg | INTRAMUSCULAR | Status: DC | PRN
  Administered 2018-07-20: 2 mg via INTRAVENOUS

## 2018-07-20 MED ORDER — GABAPENTIN 300 MG PO CAPS
300.0000 mg | ORAL_CAPSULE | Freq: Once | ORAL | Status: AC
  Administered 2018-07-20: 300 mg via ORAL

## 2018-07-20 MED ORDER — PROPOFOL 10 MG/ML IV BOLUS
INTRAVENOUS | Status: DC | PRN
  Administered 2018-07-20: 200 mg via INTRAVENOUS

## 2018-07-20 SURGICAL SUPPLY — 41 items
3.8 excalibur ×3 IMPLANT
BANDAGE ACE 6X5 VEL STRL LF (GAUZE/BANDAGES/DRESSINGS) ×3 IMPLANT
BLADE CUDA 5.5 (BLADE) IMPLANT
BLADE CUDA GRT WHITE 3.5 (BLADE) IMPLANT
BLADE CUTTER GATOR 3.5 (BLADE) IMPLANT
BLADE CUTTER MENIS 5.5 (BLADE) IMPLANT
CHLORAPREP W/TINT 26ML (MISCELLANEOUS) ×3 IMPLANT
CUFF TOURNIQUET SINGLE 34IN LL (TOURNIQUET CUFF) ×3 IMPLANT
CUTTER MENISCUS  4.2MM (BLADE)
CUTTER MENISCUS 4.2MM (BLADE) IMPLANT
DISSECTOR  3.8MM X 13CM (MISCELLANEOUS) ×2
DISSECTOR 3.8MM X 13CM (MISCELLANEOUS) ×1 IMPLANT
DRAPE ARTHROSCOPY W/POUCH 90 (DRAPES) ×3 IMPLANT
DRAPE IMP U-DRAPE 54X76 (DRAPES) ×3 IMPLANT
DRAPE U-SHAPE 47X51 STRL (DRAPES) ×3 IMPLANT
DRSG EMULSION OIL 3X3 NADH (GAUZE/BANDAGES/DRESSINGS) ×3 IMPLANT
GAUZE SPONGE 4X4 12PLY STRL (GAUZE/BANDAGES/DRESSINGS) ×3 IMPLANT
GLOVE BIO SURGEON STRL SZ 6.5 (GLOVE) ×2 IMPLANT
GLOVE BIO SURGEON STRL SZ7.5 (GLOVE) ×3 IMPLANT
GLOVE BIO SURGEONS STRL SZ 6.5 (GLOVE) ×1
GLOVE BIOGEL PI IND STRL 7.0 (GLOVE) ×2 IMPLANT
GLOVE BIOGEL PI IND STRL 8 (GLOVE) ×1 IMPLANT
GLOVE BIOGEL PI IND STRL 8.5 (GLOVE) IMPLANT
GLOVE BIOGEL PI INDICATOR 7.0 (GLOVE) ×4
GLOVE BIOGEL PI INDICATOR 8 (GLOVE) ×2
GLOVE BIOGEL PI INDICATOR 8.5 (GLOVE)
GOWN STRL REUS W/ TWL LRG LVL3 (GOWN DISPOSABLE) ×2 IMPLANT
GOWN STRL REUS W/ TWL XL LVL3 (GOWN DISPOSABLE) ×1 IMPLANT
GOWN STRL REUS W/TWL LRG LVL3 (GOWN DISPOSABLE) ×4
GOWN STRL REUS W/TWL XL LVL3 (GOWN DISPOSABLE) ×2
KNEE WRAP E Z 3 GEL PACK (MISCELLANEOUS) ×3 IMPLANT
MANIFOLD NEPTUNE II (INSTRUMENTS) ×3 IMPLANT
PACK ARTHROSCOPY DSU (CUSTOM PROCEDURE TRAY) ×3 IMPLANT
PACK BASIN DAY SURGERY FS (CUSTOM PROCEDURE TRAY) ×3 IMPLANT
PROBE BIPOLAR ATHRO 135MM 90D (MISCELLANEOUS) IMPLANT
SUT ETHILON 3 0 PS 1 (SUTURE) ×3 IMPLANT
TAPE CLOTH 3X10 TAN LF (GAUZE/BANDAGES/DRESSINGS) IMPLANT
TOWEL GREEN STERILE FF (TOWEL DISPOSABLE) ×3 IMPLANT
TOWEL OR NON WOVEN STRL DISP B (DISPOSABLE) ×3 IMPLANT
TUBING ARTHRO INFLOW-ONLY STRL (TUBING) IMPLANT
WATER STERILE IRR 1000ML POUR (IV SOLUTION) ×3 IMPLANT

## 2018-07-20 NOTE — Anesthesia Procedure Notes (Signed)
Procedure Name: LMA Insertion Date/Time: 07/20/2018 12:46 PM Performed by: Maryella Shivers, CRNA Pre-anesthesia Checklist: Patient identified, Emergency Drugs available, Suction available and Patient being monitored Patient Re-evaluated:Patient Re-evaluated prior to induction Oxygen Delivery Method: Circle system utilized Preoxygenation: Pre-oxygenation with 100% oxygen Induction Type: IV induction Ventilation: Mask ventilation without difficulty LMA: LMA inserted LMA Size: 5.0 Number of attempts: 1 Airway Equipment and Method: Bite block Placement Confirmation: positive ETCO2 Tube secured with: Tape Dental Injury: Teeth and Oropharynx as per pre-operative assessment

## 2018-07-20 NOTE — Discharge Instructions (Signed)
WBAT Remove dressings on Monday and ok to shower then. Place bandaids over incisions    Post Anesthesia Home Care Instructions  Activity: Get plenty of rest for the remainder of the day. A responsible individual must stay with you for 24 hours following the procedure.  For the next 24 hours, DO NOT: -Drive a car -Paediatric nurse -Drink alcoholic beverages -Take any medication unless instructed by your physician -Make any legal decisions or sign important papers.  Meals: Start with liquid foods such as gelatin or soup. Progress to regular foods as tolerated. Avoid greasy, spicy, heavy foods. If nausea and/or vomiting occur, drink only clear liquids until the nausea and/or vomiting subsides. Call your physician if vomiting continues.  Special Instructions/Symptoms: Your throat may feel dry or sore from the anesthesia or the breathing tube placed in your throat during surgery. If this causes discomfort, gargle with warm salt water. The discomfort should disappear within 24 hours.  If you had a scopolamine patch placed behind your ear for the management of post- operative nausea and/or vomiting:  1. The medication in the patch is effective for 72 hours, after which it should be removed.  Wrap patch in a tissue and discard in the trash. Wash hands thoroughly with soap and water. 2. You may remove the patch earlier than 72 hours if you experience unpleasant side effects which may include dry mouth, dizziness or visual disturbances. 3. Avoid touching the patch. Wash your hands with soap and water after contact with the patch.

## 2018-07-20 NOTE — Op Note (Signed)
07/20/2018  12:47 PM  PATIENT:  Andrew Brock    PRE-OPERATIVE DIAGNOSIS:  RIGHT KNEE  TEAR OF LATERAL MENISCUS  POST-OPERATIVE DIAGNOSIS:  Same  PROCEDURE:  RIGHT KNEE ARTHROSCOPY  CHONDROPLASTY, MEDIAL AND LATERAL MENISECTOMIES  SURGEON:  MUR[facility], TIMOTHY D, MD  ASSISTANT: none  ANESTHESIA:   General  BLOOD LOSS: min  COMPLICATIONS: None   PREOPERATIVE INDICATIONS:  Andrew Brock is a  57 y.o. male with a diagnosis of RIGHT KNEE  TEAR OF LATERAL MENISCUS who failed conservative measures and elected for surgical management.    The risks benefits and alternatives were discussed with the patient preoperatively including but not limited to the risks of infection, bleeding, nerve injury, cardiopulmonary complications, the need for revision surgery, among others, and the patient was willing to proceed.  OPERATIVE IMPLANTS: none  OPERATIVE FINDINGS: Examination under anesthesia: stable Diagnostic Arthroscopy:  articular cartilage:PF chondromalacia and medial FC  Medial meniscus:post tear Lateral meniscus:poste tear Anterior cruciate ligament/PCL: stable Loose bodies: none    OPERATIVE PROCEDURE:  Patient was identified in the preoperative holding area and site was marked by me male was transported to the operating theater and placed on the table in supine position taking care to pad all bony prominences. After a preincinduction time out anesthesia was induced.  male received ancef for preoperative antibiotics. The Right lower extremity was prepped and draped in normal sterile fashion and a pre-incision timeout was performed.   A small stab incision was made in the anterolateral portal position. The arthroscope was introduced in the joint. A medial portal was then established under direct visualization just above the anterior horn of the medial meniscus. Diagnostic arthroscopy was then carried out with findings as described above.  I performed a patial medial and lateral  meniscectomy with a biter and shaver. I left stable mensiscus.  I performed a chondroplasty of the Patellafemoral surface and MFC using a shaver.   The arthroscopic equipment was removed from the joint and the portals were closed with 3-0 nylon in an interrupted fashion. The knee was infiltrated with depomedrol.  Sterile dressings were then applied including Xeroform 4 x 4's ABDs an ACE bandage.  The patient was then allowed to awaken from general anesthesia, transferred to the stretcher and taken to the recovery room in stable condition.  POSTOPERATIVE PLAN: The patient will be discharged home today and will followup in one week for suture removal and wound check.  VTE prophylaxis: ASA and mobilization

## 2018-07-20 NOTE — Anesthesia Preprocedure Evaluation (Signed)
Anesthesia Evaluation  Patient identified by MRN, date of birth, ID band Patient awake    Reviewed: Allergy & Precautions, NPO status , Patient's Chart, lab work & pertinent test results  History of Anesthesia Complications Negative for: history of anesthetic complications  Airway Mallampati: II  TM Distance: >3 FB Neck ROM: Full    Dental  (+) Teeth Intact, Missing,    Pulmonary neg pulmonary ROS,    breath sounds clear to auscultation       Cardiovascular negative cardio ROS   Rhythm:Regular     Neuro/Psych negative neurological ROS  negative psych ROS   GI/Hepatic negative GI ROS, Neg liver ROS,   Endo/Other  Morbid obesity  Renal/GU negative Renal ROS     Musculoskeletal   Abdominal   Peds  Hematology negative hematology ROS (+)   Anesthesia Other Findings   Reproductive/Obstetrics                             Anesthesia Physical Anesthesia Plan  ASA: II  Anesthesia Plan: General   Post-op Pain Management:    Induction: Intravenous  PONV Risk Score and Plan: 2 and Ondansetron and Dexamethasone  Airway Management Planned: LMA and Oral ETT  Additional Equipment: None  Intra-op Plan:   Post-operative Plan: Extubation in OR  Informed Consent: I have reviewed the patients History and Physical, chart, labs and discussed the procedure including the risks, benefits and alternatives for the proposed anesthesia with the patient or authorized representative who has indicated his/her understanding and acceptance.   Dental advisory given  Plan Discussed with: CRNA and Surgeon  Anesthesia Plan Comments:         Anesthesia Quick Evaluation

## 2018-07-20 NOTE — Anesthesia Postprocedure Evaluation (Signed)
Anesthesia Post Note  Patient: Andrew Brock  Procedure(s) Performed: RIGHT KNEE ARTHROSCOPY  CHONDROPLASTY, PARTIAL LATERAL MENISECTOMY (Right Knee)     Patient location during evaluation: PACU Anesthesia Type: General Level of consciousness: awake and alert Pain management: pain level controlled Vital Signs Assessment: post-procedure vital signs reviewed and stable Respiratory status: spontaneous breathing, nonlabored ventilation, respiratory function stable and patient connected to nasal cannula oxygen Cardiovascular status: blood pressure returned to baseline and stable Postop Assessment: no apparent nausea or vomiting Anesthetic complications: no    Last Vitals:  Vitals:   07/20/18 1440 07/20/18 1515  BP: (!) 141/94   Pulse: 78 80  Resp: 20 20  Temp: 36.5 C   SpO2: 96% 98%    Last Pain:  Vitals:   07/20/18 1515  TempSrc:   PainSc: 4                  Sherrell Farish DAVID

## 2018-07-20 NOTE — Transfer of Care (Signed)
Immediate Anesthesia Transfer of Care Note  Patient: Andrew Brock  Procedure(s) Performed: RIGHT KNEE ARTHROSCOPY  CHONDROPLASTY, MEDIAL AND LATERAL MENISECTOMIES (Right Knee)  Patient Location: PACU  Anesthesia Type:General  Level of Consciousness: sedated  Airway & Oxygen Therapy: Patient Spontanous Breathing and Patient connected to face mask oxygen  Post-op Assessment: Report given to RN and Post -op Vital signs reviewed and stable  Post vital signs: Reviewed and stable  Last Vitals:  Vitals Value Taken Time  BP    Temp    Pulse 62 07/20/2018  1:35 PM  Resp    SpO2 97 % 07/20/2018  1:35 PM  Vitals shown include unvalidated device data.  Last Pain:  Vitals:   07/20/18 1057  TempSrc: Oral  PainSc: 7          Complications: No apparent anesthesia complications

## 2018-07-20 NOTE — Interval H&P Note (Signed)
History and Physical Interval Note:  07/20/2018 12:34 PM  Andrew Brock  has presented today for surgery, with the diagnosis of [employer]  The various methods of treatment have been discussed with the patient and family. After consideration of risks, benefits and other options for treatment, the patient has consented to  Procedure(s): RIGHT KNEE ARTHROSCOPY  CHONDROPLASTY, MEDIAL AND LATERAL MENISECTOMIES (Right) as a surgical intervention .  The patient's history has been reviewed, patient examined, no change in status, stable for surgery.  I have reviewed the patient's chart and labs.  Questions were answered to the patient's satisfaction.     Andrew Brock D

## 2018-07-23 ENCOUNTER — Encounter (HOSPITAL_BASED_OUTPATIENT_CLINIC_OR_DEPARTMENT_OTHER): Payer: Self-pay | Admitting: Orthopedic Surgery

## 2021-02-05 ENCOUNTER — Other Ambulatory Visit: Payer: Self-pay | Admitting: Urology

## 2021-02-05 ENCOUNTER — Other Ambulatory Visit (HOSPITAL_COMMUNITY): Payer: Self-pay | Admitting: Urology

## 2021-02-05 DIAGNOSIS — D4101 Neoplasm of uncertain behavior of right kidney: Secondary | ICD-10-CM

## 2021-02-15 ENCOUNTER — Ambulatory Visit (HOSPITAL_COMMUNITY)
Admission: RE | Admit: 2021-02-15 | Discharge: 2021-02-15 | Disposition: A | Discharge status: Home / Self Care | Payer: No Typology Code available for payment source | Source: Ambulatory Visit | Attending: Urology | Admitting: Urology

## 2021-02-15 ENCOUNTER — Other Ambulatory Visit: Payer: Self-pay

## 2021-02-15 DIAGNOSIS — D4101 Neoplasm of uncertain behavior of right kidney: Secondary | ICD-10-CM

## 2021-02-15 MED ORDER — GADOBUTROL 1 MMOL/ML IV SOLN
10.0000 mL | Freq: Once | INTRAVENOUS | Status: AC | PRN
  Administered 2021-02-15: 10 mL via INTRAVENOUS

## 2021-04-12 ENCOUNTER — Other Ambulatory Visit (HOSPITAL_COMMUNITY): Payer: Self-pay | Admitting: Urology

## 2021-04-12 DIAGNOSIS — N2889 Other specified disorders of kidney and ureter: Secondary | ICD-10-CM

## 2021-04-13 ENCOUNTER — Encounter (HOSPITAL_COMMUNITY): Payer: Self-pay | Admitting: Radiology

## 2021-04-13 NOTE — Progress Notes (Signed)
Andrew Brock Male, 60 y.o., 31-May-1961  MRN:  [id number] Phone:  [phone] Andrew Brock)       PCP:  [facility]:  [facility]            RE: US Renal Mass Biopsy Received: Today Andrew Cleveland, MD  Jillyn Hidden  Ok   CT core RLP renal mass  May need contrast  Not confident [place] would allow adequate guidance   DDH        Previous Messages   ----- Message -----  From: Andrew Brock  Sent: 04/12/2021  3:09 PM EDT  To: Ir Procedure Requests  Subject: US Renal Mass Biopsy               Procedure: US Renal Mass Biopsy   Reason: right renal mass   History: MRI in computer   Provider: Ceasar Brock   Provider Contact: [phone]

## 2021-04-19 ENCOUNTER — Other Ambulatory Visit: Payer: Self-pay | Admitting: Student

## 2021-04-20 ENCOUNTER — Other Ambulatory Visit: Payer: Self-pay

## 2021-04-20 ENCOUNTER — Encounter (HOSPITAL_COMMUNITY): Payer: Self-pay

## 2021-04-20 ENCOUNTER — Ambulatory Visit (HOSPITAL_COMMUNITY)
Admission: RE | Admit: 2021-04-20 | Discharge: 2021-04-20 | Disposition: A | Discharge status: Home / Self Care | Payer: Commercial Managed Care - PPO | Source: Ambulatory Visit | Attending: Urology | Admitting: Urology

## 2021-04-20 DIAGNOSIS — Z8571 Personal history of Hodgkin lymphoma: Secondary | ICD-10-CM | POA: Diagnosis not present

## 2021-04-20 DIAGNOSIS — N2889 Other specified disorders of kidney and ureter: Secondary | ICD-10-CM | POA: Diagnosis not present

## 2021-04-20 DIAGNOSIS — D49511 Neoplasm of unspecified behavior of right kidney: Secondary | ICD-10-CM | POA: Diagnosis present

## 2021-04-20 LAB — CBC
HCT: 44.8 % (ref 39.0–52.0)
Hemoglobin: 15 g/dL (ref 13.0–17.0)
MCH: 30.4 pg (ref 26.0–34.0)
MCHC: 33.5 g/dL (ref 30.0–36.0)
MCV: 90.7 fL (ref 80.0–100.0)
Platelets: 262 10*3/uL (ref 150–400)
RBC: 4.94 MIL/uL (ref 4.22–5.81)
RDW: 13.5 % (ref 11.5–15.5)
WBC: 5 10*3/uL (ref 4.0–10.5)
nRBC: 0 % (ref 0.0–0.2)

## 2021-04-20 LAB — PROTIME-INR
INR: 1 (ref 0.8–1.2)
Prothrombin Time: 13 seconds (ref 11.4–15.2)

## 2021-04-20 LAB — APTT: aPTT: 27 seconds (ref 24–36)

## 2021-04-20 MED ORDER — GELATIN ABSORBABLE 12-7 MM EX MISC
CUTANEOUS | Status: AC
  Filled 2021-04-20: qty 1

## 2021-04-20 MED ORDER — SODIUM CHLORIDE 0.9 % IV SOLN: INTRAVENOUS | Status: DC

## 2021-04-20 MED ORDER — MIDAZOLAM HCL 2 MG/2ML IJ SOLN
INTRAMUSCULAR | Status: AC
  Filled 2021-04-20: qty 4

## 2021-04-20 MED ORDER — LIDOCAINE HCL 1 % IJ SOLN
INTRAMUSCULAR | Status: AC
  Filled 2021-04-20: qty 20

## 2021-04-20 MED ORDER — MIDAZOLAM HCL 2 MG/2ML IJ SOLN
INTRAMUSCULAR | Status: DC | PRN
  Administered 2021-04-20 (×2): 1 mg via INTRAVENOUS

## 2021-04-20 MED ORDER — FENTANYL CITRATE (PF) 100 MCG/2ML IJ SOLN
INTRAMUSCULAR | Status: AC
  Filled 2021-04-20: qty 4

## 2021-04-20 MED ORDER — FENTANYL CITRATE (PF) 100 MCG/2ML IJ SOLN
INTRAMUSCULAR | Status: DC | PRN
  Administered 2021-04-20 (×2): 50 ug via INTRAVENOUS

## 2021-04-20 NOTE — Progress Notes (Addendum)
Discharge instructions reviewed with pt  Voices understanding. attempted to call pt brother message left for him to return call.

## 2021-04-20 NOTE — Procedures (Signed)
Pre procedural Dx: Right renal lesion worrisome for [facility] Post procedural Dx: Same  Technically successful CT guided biopsy of indeterminate right renal lesion.   EBL: Trace Complications: None immediate.   Ronny Bacon, MD Pager #: [phone]

## 2021-04-20 NOTE — Discharge Instructions (Signed)
Percutaneous Kidney Biopsy, Care After This sheet gives you information about how to care for yourself after your procedure. Your health care provider may also give you more specific instructions. If you have problems or questions, contact your health care provider. What can I expect after the procedure? After the procedure, it is common to have:  Pain or soreness near the biopsy site.  Pink or cloudy urine for 24 hours after the procedure. This is normal. Follow these instructions at home: Activity  Return to your normal activities as told by your health care provider. Ask your health care provider what activities are safe for you.  If you were given a sedative during the procedure, it can affect you for several hours. Do not drive or operate machinery until your health care provider says that it is safe.  Do not lift anything that is heavier than 10 lb (4.5 kg), or the limit that you are told, until your health care provider says that it is safe.  Avoid activities that take a lot of effort until your health care provider approves. Most people will have to wait 2 weeks before returning to activities such as exercise or sex. General instructions  Take over-the-counter and prescription medicines only as told by your health care provider.  Follow instructions from your health care provider about eating or drinking restrictions.  Check your biopsy site every day for signs of infection. Check for: ? More redness, swelling, or pain. ? Fluid or blood. ? Warmth. ? Pus or a bad smell.  Keep all follow-up visits as told by your health care provider. This is important.   Contact a health care provider if:  You have more redness, swelling, or pain around your biopsy site.  You have fluid or blood coming from your biopsy site.  Your biopsy site feels warm to the touch.  You have pus or a bad smell coming from your biopsy site.  You have blood in your urine more than 24 hours after your  procedure. Get help right away if:  Your urine is dark red or brown.  You have a fever.  You are not able to urinate.  You feel burning when you urinate.  You feel dizzy or light-headed.  You have severe pain in your abdomen or side. Summary  After the procedure, it is common to have pain or soreness at the biopsy site and pink or cloudy urine for the first 24 hours.  Check your biopsy site each day for signs of infection, such as more redness, swelling, or pain; fluid, blood, pus or a bad smell coming from the biopsy site; or the biopsy site feeling warm to the touch.  Return to your normal activities as told by your health care provider. This information is not intended to replace advice given to you by your health care provider. Make sure you discuss any questions you have with your health care provider. Document Revised: 02/21/2020 Document Reviewed: 02/21/2020 Elsevier Patient Education  2021 [employer]  
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
## 2021-04-20 NOTE — Progress Notes (Signed)
Reviewed d/c instructions via telephone with pt brother Chriss Czar, Voices understanding.

## 2021-04-22 LAB — SURGICAL PATHOLOGY

## 2021-06-19 ENCOUNTER — Encounter (HOSPITAL_COMMUNITY): Payer: Self-pay | Admitting: Emergency Medicine

## 2021-06-19 ENCOUNTER — Other Ambulatory Visit: Payer: Self-pay

## 2021-06-19 ENCOUNTER — Emergency Department (HOSPITAL_COMMUNITY)
Admission: EM | Admit: 2021-06-19 | Discharge: 2021-06-19 | Disposition: A | Discharge status: Home / Self Care | Payer: No Typology Code available for payment source | Attending: Emergency Medicine | Admitting: Emergency Medicine

## 2021-06-19 ENCOUNTER — Emergency Department (HOSPITAL_COMMUNITY): Payer: No Typology Code available for payment source

## 2021-06-19 DIAGNOSIS — M79604 Pain in right leg: Secondary | ICD-10-CM | POA: Insufficient documentation

## 2021-06-19 DIAGNOSIS — Z8571 Personal history of Hodgkin lymphoma: Secondary | ICD-10-CM | POA: Diagnosis not present

## 2021-06-19 DIAGNOSIS — M545 Low back pain, unspecified: Secondary | ICD-10-CM

## 2021-06-19 MED ORDER — NAPROXEN 500 MG PO TABS: 500.0000 mg | ORAL_TABLET | Freq: Two times a day (BID) | ORAL | 0 refills | Status: DC

## 2021-06-19 MED ORDER — HYDROCODONE-ACETAMINOPHEN 5-325 MG PO TABS: 1.0000 | ORAL_TABLET | ORAL | 0 refills | Status: DC | PRN

## 2021-06-19 MED ORDER — CYCLOBENZAPRINE HCL 10 MG PO TABS: 10.0000 mg | ORAL_TABLET | Freq: Two times a day (BID) | ORAL | 0 refills | Status: DC | PRN

## 2021-06-19 MED ORDER — ACETAMINOPHEN 325 MG PO TABS
650.0000 mg | ORAL_TABLET | Freq: Once | ORAL | Status: AC | PRN
  Administered 2021-06-19: 650 mg via ORAL

## 2021-06-19 NOTE — Discharge Instructions (Addendum)
You were evaluated in the Emergency Department and after careful evaluation, we did not find any emergent condition requiring admission or further testing in the hospital.  Your exam/testing today is overall reassuring.  Your pain may be due to a herniated disc in your back.  This is likely heal on its own.  You may have lingering back pain for a few weeks.  Use the Naprosyn anti-inflammatory medication twice daily for pain.  For more significant pain, you can use the Flexeril muscle relaxer or the Norco pain medicine.  Please return to the Emergency Department if you experience any worsening of your condition.   Thank you for allowing [place] to be a part of your care.

## 2021-06-19 NOTE — ED Notes (Signed)
Pt ambulated with a steady gait out of Hospital ER.

## 2021-06-19 NOTE — ED Triage Notes (Signed)
Pt states getting out of car yesterday afternoon and having onset of right hip pain. Pain worsening when getting up. Pain radiates to right back. Sensation intact.
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
## 2022-09-23 IMAGING — MR MR ABDOMEN WO/W CM
19 series · 48 of 48 positions shown · IV contrast (10 GADAVIST)
Comparison: Upside CT abdomen pelvis January 01, 2021

CLINICAL DATA: Further characterization of renal lesion seen on
prior imaging.

EXAM:
MRI ABDOMEN WITHOUT AND WITH CONTRAST
TECHNIQUE: Multiplanar multisequence MR imaging of the abdomen was performed
both before and after the administration of intravenous contrast.
CONTRAST:  10mL GADAVIST GADOBUTROL 1 MMOL/ML IV SOLN

[Series 2: T2 · coronal · 6.0mm · 1.56mm/px · 2 of 41 slices shown (1 of 2)]
[im 1/41]
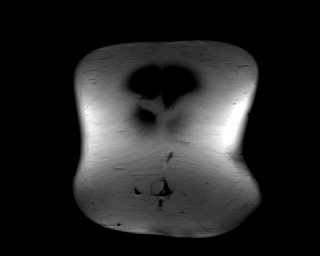
[im 41/41]
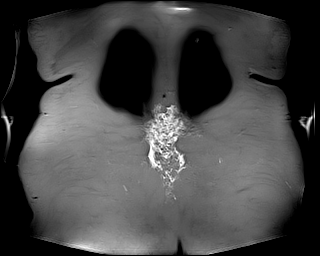

[Series 4: T2 fat-sat · axial · 6.0mm · 1.31mm/px · z∈[-67,+185]mm · 2 of 36 slices shown]
[im 1/36]
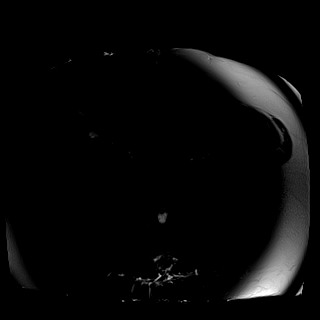
[im 36/36]
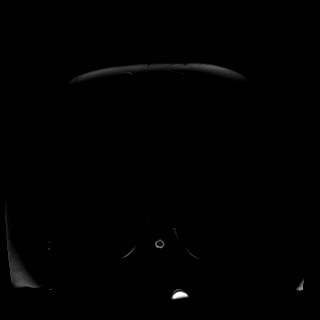

[Series 5: DWI · axial · 6.0mm · 1.57mm/px · z∈[-58,+194]mm · 3 of 72 slices shown (1 of 2)]
[im 1/72]
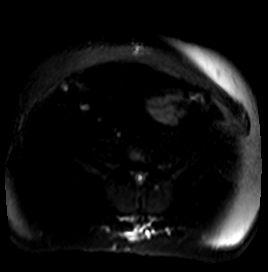
[im 36/72]
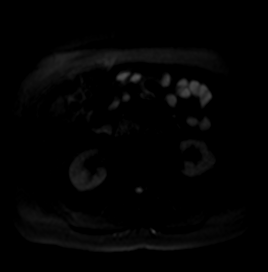
[im 72/72]
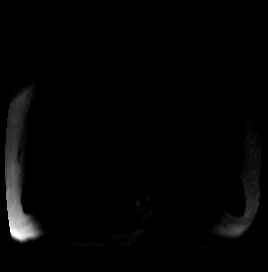

[Series 6: DWI · axial · 6.0mm · 1.57mm/px · 1 of 36 slices shown (2 of 2)]
[im 1/36]
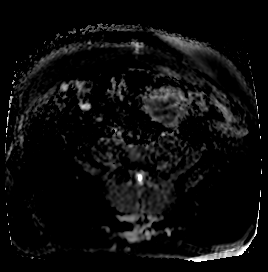

[Series 7: bSSFP · axial · 5.0mm · 0.84mm/px · z∈[-95,+202]mm · 2 of 55 slices shown]
[im 1/55]
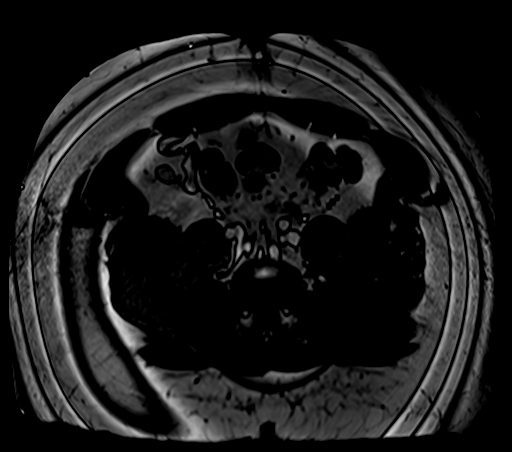
[im 55/55]
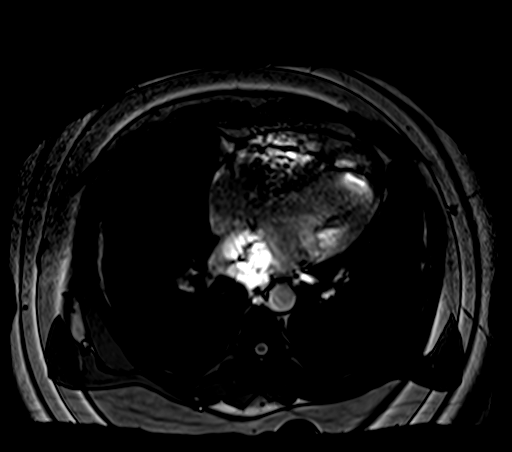

[Series 8: T1 · axial · 3.0mm · 1.31mm/px · z∈[-65,+220]mm · 3 of 96 slices shown (1 of 2)]
[im 1/96]
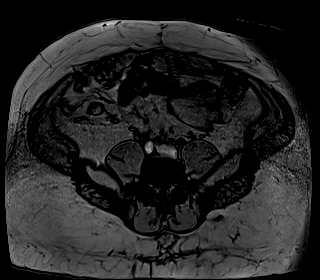
[im 48/96]
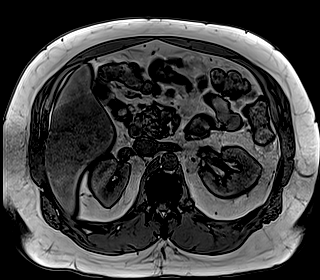
[im 96/96]
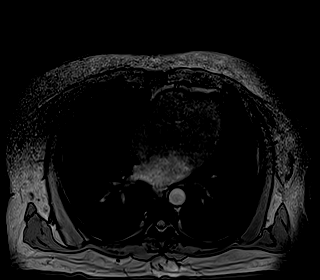

[Series 9: T1 · axial · 3.0mm · 1.31mm/px · z∈[-65,+220]mm · 3 of 96 slices shown (2 of 2)]
[im 1/96]
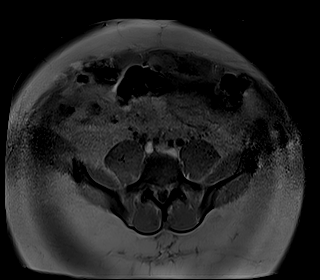
[im 48/96]
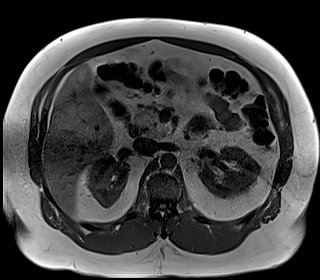
[im 96/96]
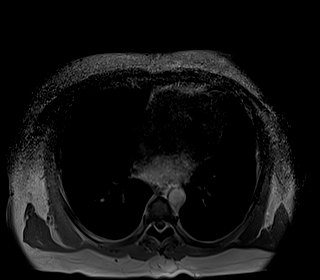

[Series 11: T1 dynamic · axial · 3.0mm · 1.31mm/px · z∈[-54,+207]mm · 3 of 88 slices shown (1 of 11)]
[im 1/88]
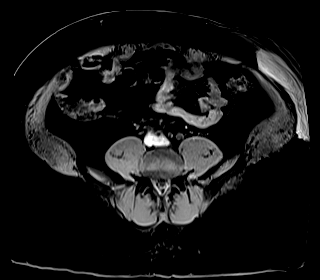
[im 44/88]
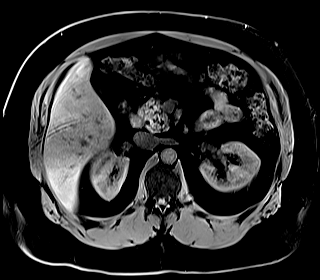
[im 88/88]
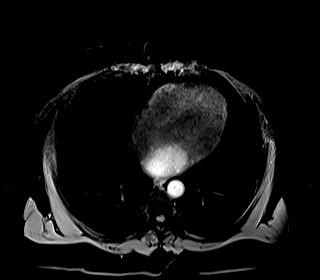

[Series 15: T1 dynamic · axial · 3.0mm · 1.31mm/px · z∈[-54,+207]mm · 3 of 88 slices shown (2 of 11)]
[im 1/88]
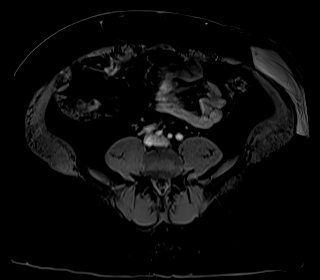
[im 44/88]
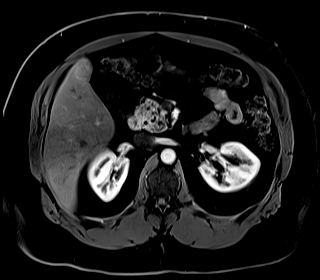
[im 88/88]
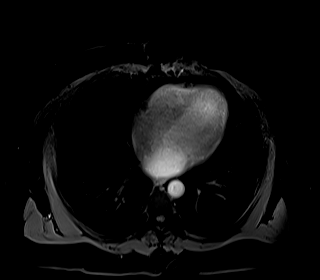

[Series 16: T1 dynamic · axial · 3.0mm · 1.31mm/px · z∈[-54,+207]mm · 3 of 88 slices shown (3 of 11)]
[im 1/88]
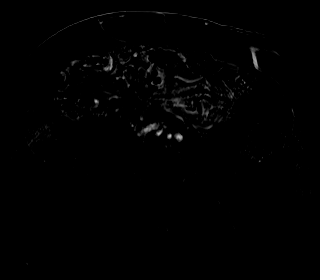
[im 44/88]
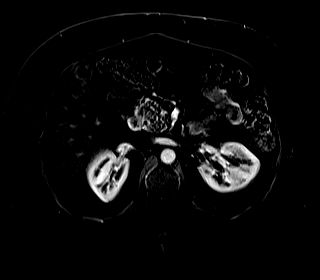
[im 88/88]
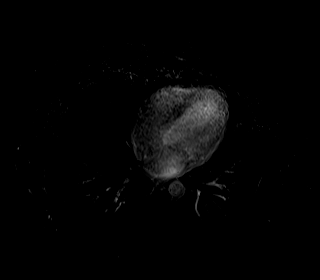

[Series 19: T1 dynamic · axial · 3.0mm · 1.31mm/px · z∈[-54,+207]mm · 3 of 88 slices shown (4 of 11)]
[im 1/88]
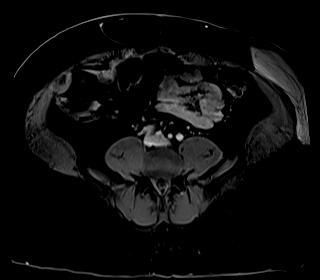
[im 44/88]
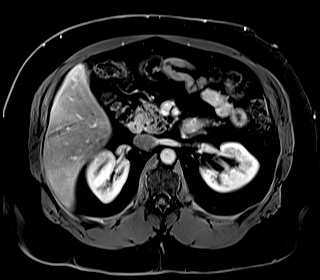
[im 88/88]
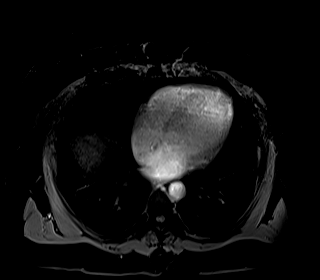

[Series 20: T1 dynamic · axial · 3.0mm · 1.31mm/px · z∈[-54,+207]mm · 3 of 88 slices shown (5 of 11)]
[im 1/88]
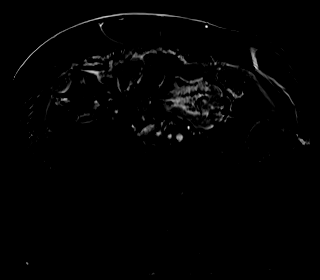
[im 44/88]
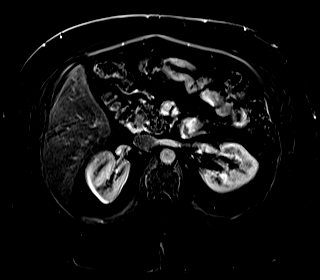
[im 88/88]
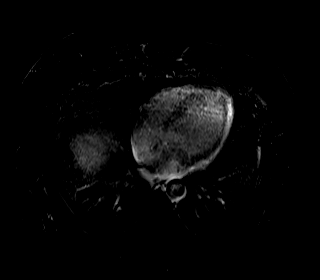

[Series 23: T1 dynamic · axial · 3.0mm · 1.31mm/px · z∈[-54,+207]mm · 3 of 88 slices shown (6 of 11)]
[im 1/88]
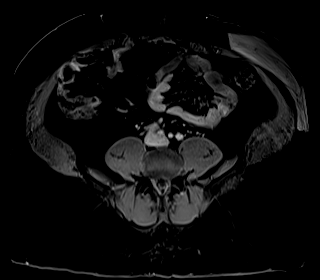
[im 44/88]
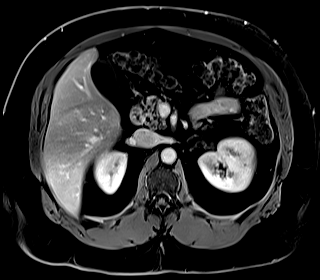
[im 88/88]
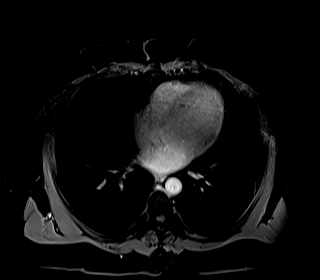

[Series 24: T1 dynamic · axial · 3.0mm · 1.31mm/px · z∈[-54,+207]mm · 3 of 88 slices shown (7 of 11)]
[im 1/88]
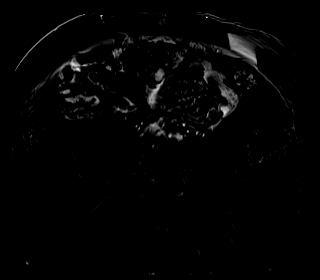
[im 44/88]
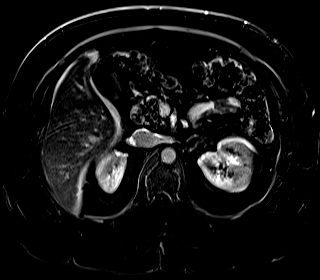
[im 88/88]
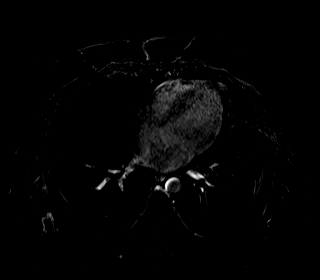

[Series 26: T1 dynamic · coronal · 5.0mm · 1.41mm/px · 2 of 64 slices shown (8 of 11)]
[im 1/64]
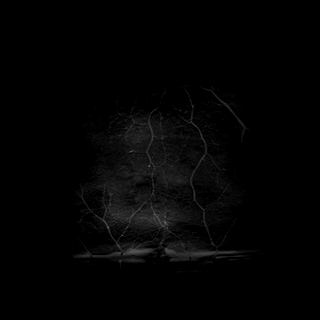
[im 64/64]
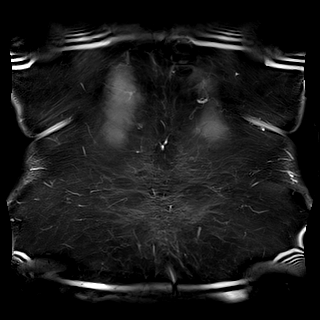

[Series 27: T2 · axial · 6.0mm · 1.64mm/px · 1 of 35 slices shown (2 of 2)]
[im 1/35]
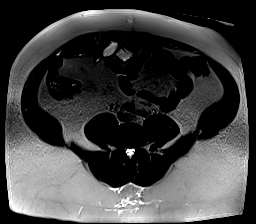

[Series 30: T1 dynamic · axial · 3.0mm · 1.31mm/px · z∈[-54,+207]mm · 3 of 88 slices shown (9 of 11)]
[im 1/88]
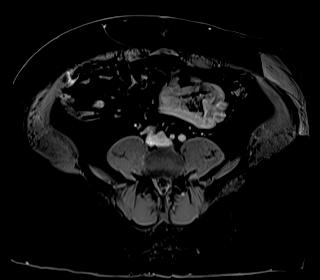
[im 44/88]
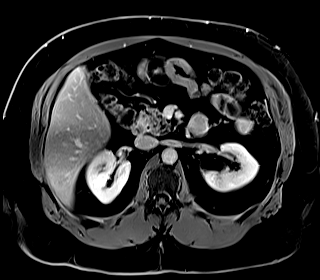
[im 88/88]
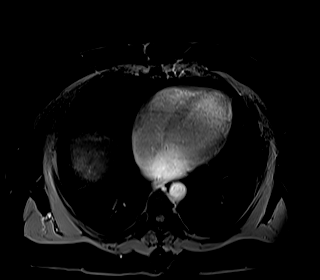

[Series 31: T1 dynamic · axial · 3.0mm · 1.31mm/px · z∈[-54,+207]mm · 3 of 88 slices shown (10 of 11)]
[im 1/88]
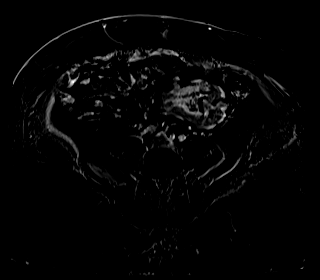
[im 44/88]
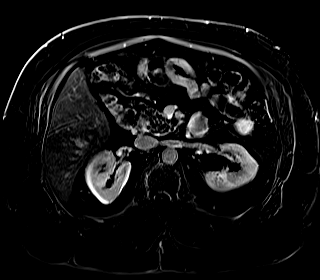
[im 88/88]
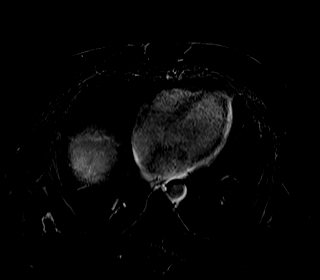

[Series 33: T1 dynamic · coronal · 5.0mm · 1.41mm/px · 2 of 64 slices shown (11 of 11)]
[im 1/64]
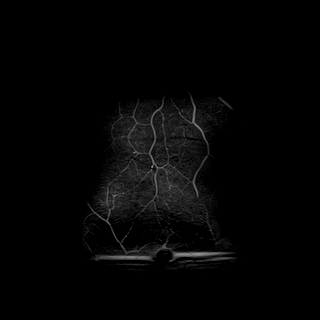
[im 64/64]
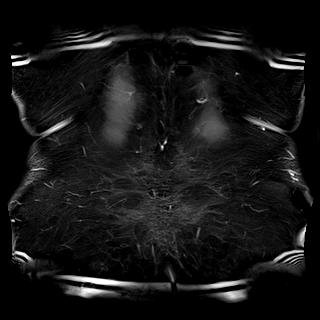

[48 of 48 positions shown; findings below may reference images not displayed]

FINDINGS: Lower chest: No acute abnormality.

Hepatobiliary: No suspicious hepatic lesion. Gallbladder is
unremarkable. No biliary ductal dilation.

Pancreas:  Unremarkable

Spleen:  Unremarkable

Adrenals/Urinary Tract: Bilateral adrenal glands are unremarkable.
No hydronephrosis.

There is a solid enhancing 2.8 x 2.6 cm intracortical RIGHT
interpolar renal lesion on image 59/16, which is stable in size
dating back to March 08, 2019.

Cystic 1 cm LEFT lower pole renal lesion which demonstrates some
internal foci of intrinsic T1 shortening without postcontrast
enhancement, consistent with a renal cyst. RIGHT upper pole renal
cyst.

Stomach/Bowel: Visualized portions within the abdomen are
unremarkable.

Vascular/Lymphatic: No pathologically enlarged lymph nodes
identified. No abdominal aortic aneurysm demonstrated.

Other:  No abdominopelvic ascites.

Musculoskeletal: No suspicious bone lesions identified.
IMPRESSION: 1. Solid enhancing 2 point cm RIGHT interpolar renal lesion, which
is stable in size dating back to March 08, 2019. This is consistent
with a solid enhancing renal neoplasm, likely renal cell carcinoma.
2. No evidence of metastatic disease in the abdomen.
3. Bilateral renal cysts.

## 2023-04-28 ENCOUNTER — Other Ambulatory Visit (HOSPITAL_COMMUNITY): Payer: Self-pay | Admitting: Urology

## 2023-04-28 DIAGNOSIS — D49511 Neoplasm of unspecified behavior of right kidney: Secondary | ICD-10-CM

## 2023-05-17 ENCOUNTER — Ambulatory Visit (HOSPITAL_COMMUNITY)
Admission: RE | Admit: 2023-05-17 | Discharge: 2023-05-17 | Disposition: A | Discharge status: Home / Self Care | Payer: No Typology Code available for payment source | Source: Ambulatory Visit | Attending: Urology | Admitting: Urology

## 2023-05-17 DIAGNOSIS — D49511 Neoplasm of unspecified behavior of right kidney: Secondary | ICD-10-CM

## 2023-05-17 MED ORDER — GADOBUTROL 1 MMOL/ML IV SOLN
10.0000 mL | Freq: Once | INTRAVENOUS | Status: AC | PRN
  Administered 2023-05-17: 10 mL via INTRAVENOUS

## 2023-11-10 ENCOUNTER — Emergency Department (HOSPITAL_COMMUNITY): Payer: No Typology Code available for payment source

## 2023-11-10 ENCOUNTER — Emergency Department (HOSPITAL_COMMUNITY)
Admission: EM | Admit: 2023-11-10 | Discharge: 2023-11-10 | Disposition: A | Discharge status: Home / Self Care | Payer: Worker's Compensation | Attending: Emergency Medicine | Admitting: Emergency Medicine

## 2023-11-10 ENCOUNTER — Encounter (HOSPITAL_COMMUNITY): Payer: Self-pay

## 2023-11-10 ENCOUNTER — Other Ambulatory Visit: Payer: Self-pay

## 2023-11-10 DIAGNOSIS — Z7982 Long term (current) use of aspirin: Secondary | ICD-10-CM | POA: Insufficient documentation

## 2023-11-10 DIAGNOSIS — S20229A Contusion of unspecified back wall of thorax, initial encounter: Secondary | ICD-10-CM | POA: Insufficient documentation

## 2023-11-10 DIAGNOSIS — S8991XA Unspecified injury of right lower leg, initial encounter: Secondary | ICD-10-CM | POA: Diagnosis present

## 2023-11-10 DIAGNOSIS — S8011XA Contusion of right lower leg, initial encounter: Secondary | ICD-10-CM | POA: Insufficient documentation

## 2023-11-10 MED ORDER — CYCLOBENZAPRINE HCL 10 MG PO TABS: 10.0000 mg | ORAL_TABLET | Freq: Two times a day (BID) | ORAL | 0 refills | Status: DC | PRN

## 2023-11-10 MED ORDER — HYDROCODONE-ACETAMINOPHEN 5-325 MG PO TABS
1.0000 | ORAL_TABLET | Freq: Once | ORAL | Status: AC
  Administered 2023-11-10: 1 via ORAL
  Filled 2023-11-10: qty 1
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
## 2023-11-10 NOTE — Discharge Instructions (Addendum)
You have been seen today for your complaint of back pain, right leg pain. Your imaging was reassuring and showed no acute abnormality. Your discharge medications include Flexeril. This is a muscle relaxer. It may cause drowsiness. Do not drive, operate heavy machinery or make important decisions when taking this medication. Only take it at night until you know how it affects you. Only take it as needed and take other medications such as ibuprofen or tylenol prior to trying this medication. Follow up with: Your primary care provider in 7 days as scheduled Please seek immediate medical care if you develop any of the following symptoms: You have numbness or a prickling and tingling sensation in your feet or legs. You are unable to walk because of pain. At this time there does not appear to be the presence of an emergent medical condition, however there is always the potential for conditions to change. Please read and follow the below instructions.  Do not take your medicine if  develop an itchy rash, swelling in your mouth or lips, or difficulty breathing; call 911 and seek immediate emergency medical attention if this occurs.  You may review your lab tests and imaging results in their entirety on your MyChart account.  Please discuss all results of fully with your primary care provider and other specialist at your follow-up visit.  Note: Portions of this text may have been transcribed using voice recognition software. Every effort was made to ensure accuracy; however, inadvertent computerized transcription errors may still be present.

## 2024-04-15 ENCOUNTER — Other Ambulatory Visit (HOSPITAL_COMMUNITY): Payer: Self-pay | Admitting: Urology

## 2024-04-15 DIAGNOSIS — D49511 Neoplasm of unspecified behavior of right kidney: Secondary | ICD-10-CM

## 2024-06-21 ENCOUNTER — Ambulatory Visit (HOSPITAL_COMMUNITY)
Admission: RE | Admit: 2024-06-21 | Discharge: 2024-06-21 | Disposition: A | Discharge status: Home / Self Care | Source: Ambulatory Visit | Attending: Urology

## 2024-06-21 DIAGNOSIS — D49511 Neoplasm of unspecified behavior of right kidney: Secondary | ICD-10-CM | POA: Diagnosis present

## 2024-06-21 MED ORDER — GADOBUTROL 1 MMOL/ML IV SOLN
10.0000 mL | Freq: Once | INTRAVENOUS | Status: AC | PRN
  Administered 2024-06-21: 10 mL via INTRAVENOUS

## 2024-06-28 ENCOUNTER — Encounter (HOSPITAL_COMMUNITY): Payer: Self-pay | Admitting: Registered Nurse

## 2024-06-28 ENCOUNTER — Emergency Department (HOSPITAL_COMMUNITY): Admitting: Registered Nurse

## 2024-06-28 ENCOUNTER — Inpatient Hospital Stay (HOSPITAL_COMMUNITY)

## 2024-06-28 ENCOUNTER — Inpatient Hospital Stay (HOSPITAL_COMMUNITY)
Admission: EM | Disposition: A | Discharge status: Home Health | Payer: Self-pay | Source: Home / Self Care | Attending: Cardiology

## 2024-06-28 ENCOUNTER — Inpatient Hospital Stay (HOSPITAL_COMMUNITY)
Admission: EM | Admit: 2024-06-28 | Discharge: 2024-07-08 | DRG: 228 | Disposition: A | Discharge status: Home Health | Attending: Cardiology | Admitting: Cardiology

## 2024-06-28 ENCOUNTER — Emergency Department (HOSPITAL_COMMUNITY)

## 2024-06-28 ENCOUNTER — Encounter (HOSPITAL_COMMUNITY): Payer: Self-pay | Admitting: Cardiology

## 2024-06-28 ENCOUNTER — Ambulatory Visit (HOSPITAL_COMMUNITY): Admit: 2024-06-28 | Admitting: Cardiovascular Disease

## 2024-06-28 DIAGNOSIS — I4892 Unspecified atrial flutter: Secondary | ICD-10-CM | POA: Diagnosis not present

## 2024-06-28 DIAGNOSIS — I110 Hypertensive heart disease with heart failure: Secondary | ICD-10-CM | POA: Diagnosis present

## 2024-06-28 DIAGNOSIS — E1165 Type 2 diabetes mellitus with hyperglycemia: Secondary | ICD-10-CM | POA: Diagnosis present

## 2024-06-28 DIAGNOSIS — I3139 Other pericardial effusion (noninflammatory): Secondary | ICD-10-CM | POA: Diagnosis not present

## 2024-06-28 DIAGNOSIS — I5082 Biventricular heart failure: Secondary | ICD-10-CM | POA: Diagnosis not present

## 2024-06-28 DIAGNOSIS — Z7982 Long term (current) use of aspirin: Secondary | ICD-10-CM

## 2024-06-28 DIAGNOSIS — I312 Hemopericardium, not elsewhere classified: Secondary | ICD-10-CM | POA: Diagnosis not present

## 2024-06-28 DIAGNOSIS — M545 Low back pain, unspecified: Secondary | ICD-10-CM | POA: Diagnosis present

## 2024-06-28 DIAGNOSIS — I161 Hypertensive emergency: Secondary | ICD-10-CM | POA: Diagnosis not present

## 2024-06-28 DIAGNOSIS — R34 Anuria and oliguria: Secondary | ICD-10-CM | POA: Diagnosis not present

## 2024-06-28 DIAGNOSIS — D72829 Elevated white blood cell count, unspecified: Secondary | ICD-10-CM | POA: Diagnosis not present

## 2024-06-28 DIAGNOSIS — J9602 Acute respiratory failure with hypercapnia: Secondary | ICD-10-CM | POA: Diagnosis not present

## 2024-06-28 DIAGNOSIS — Z9861 Coronary angioplasty status: Secondary | ICD-10-CM

## 2024-06-28 DIAGNOSIS — I219 Acute myocardial infarction, unspecified: Secondary | ICD-10-CM | POA: Diagnosis not present

## 2024-06-28 DIAGNOSIS — N179 Acute kidney failure, unspecified: Secondary | ICD-10-CM | POA: Diagnosis not present

## 2024-06-28 DIAGNOSIS — Y658 Other specified misadventures during surgical and medical care: Secondary | ICD-10-CM | POA: Diagnosis not present

## 2024-06-28 DIAGNOSIS — I493 Ventricular premature depolarization: Secondary | ICD-10-CM | POA: Diagnosis present

## 2024-06-28 DIAGNOSIS — Z8571 Personal history of Hodgkin lymphoma: Secondary | ICD-10-CM

## 2024-06-28 DIAGNOSIS — Z602 Problems related to living alone: Secondary | ICD-10-CM | POA: Diagnosis present

## 2024-06-28 DIAGNOSIS — F05 Delirium due to known physiological condition: Secondary | ICD-10-CM | POA: Diagnosis not present

## 2024-06-28 DIAGNOSIS — Z6841 Body Mass Index (BMI) 40.0 and over, adult: Secondary | ICD-10-CM

## 2024-06-28 DIAGNOSIS — E8729 Other acidosis: Secondary | ICD-10-CM | POA: Diagnosis present

## 2024-06-28 DIAGNOSIS — R079 Chest pain, unspecified: Secondary | ICD-10-CM | POA: Diagnosis present

## 2024-06-28 DIAGNOSIS — R570 Cardiogenic shock: Secondary | ICD-10-CM | POA: Diagnosis present

## 2024-06-28 DIAGNOSIS — I10 Essential (primary) hypertension: Secondary | ICD-10-CM | POA: Diagnosis not present

## 2024-06-28 DIAGNOSIS — D649 Anemia, unspecified: Secondary | ICD-10-CM | POA: Diagnosis not present

## 2024-06-28 DIAGNOSIS — I2510 Atherosclerotic heart disease of native coronary artery without angina pectoris: Secondary | ICD-10-CM | POA: Diagnosis present

## 2024-06-28 DIAGNOSIS — Z7901 Long term (current) use of anticoagulants: Secondary | ICD-10-CM

## 2024-06-28 DIAGNOSIS — I9751 Accidental puncture and laceration of a circulatory system organ or structure during a circulatory system procedure: Secondary | ICD-10-CM | POA: Diagnosis not present

## 2024-06-28 DIAGNOSIS — Z8572 Personal history of non-Hodgkin lymphomas: Secondary | ICD-10-CM

## 2024-06-28 DIAGNOSIS — K7200 Acute and subacute hepatic failure without coma: Secondary | ICD-10-CM | POA: Diagnosis not present

## 2024-06-28 DIAGNOSIS — J690 Pneumonitis due to inhalation of food and vomit: Secondary | ICD-10-CM | POA: Diagnosis not present

## 2024-06-28 DIAGNOSIS — I5023 Acute on chronic systolic (congestive) heart failure: Secondary | ICD-10-CM | POA: Diagnosis not present

## 2024-06-28 DIAGNOSIS — E119 Type 2 diabetes mellitus without complications: Secondary | ICD-10-CM

## 2024-06-28 DIAGNOSIS — E1122 Type 2 diabetes mellitus with diabetic chronic kidney disease: Secondary | ICD-10-CM | POA: Diagnosis present

## 2024-06-28 DIAGNOSIS — I314 Cardiac tamponade: Secondary | ICD-10-CM | POA: Diagnosis not present

## 2024-06-28 DIAGNOSIS — Y713 Surgical instruments, materials and cardiovascular devices (including sutures) associated with adverse incidents: Secondary | ICD-10-CM | POA: Diagnosis not present

## 2024-06-28 DIAGNOSIS — Z79899 Other long term (current) drug therapy: Secondary | ICD-10-CM

## 2024-06-28 DIAGNOSIS — I429 Cardiomyopathy, unspecified: Secondary | ICD-10-CM | POA: Diagnosis present

## 2024-06-28 DIAGNOSIS — R000 Tachycardia, unspecified: Secondary | ICD-10-CM | POA: Diagnosis not present

## 2024-06-28 DIAGNOSIS — N170 Acute kidney failure with tubular necrosis: Secondary | ICD-10-CM | POA: Diagnosis not present

## 2024-06-28 DIAGNOSIS — Z7984 Long term (current) use of oral hypoglycemic drugs: Secondary | ICD-10-CM

## 2024-06-28 DIAGNOSIS — E785 Hyperlipidemia, unspecified: Secondary | ICD-10-CM | POA: Diagnosis present

## 2024-06-28 DIAGNOSIS — G9341 Metabolic encephalopathy: Secondary | ICD-10-CM | POA: Diagnosis not present

## 2024-06-28 DIAGNOSIS — J9601 Acute respiratory failure with hypoxia: Secondary | ICD-10-CM | POA: Diagnosis not present

## 2024-06-28 DIAGNOSIS — J810 Acute pulmonary edema: Secondary | ICD-10-CM | POA: Diagnosis not present

## 2024-06-28 DIAGNOSIS — I2102 ST elevation (STEMI) myocardial infarction involving left anterior descending coronary artery: Principal | ICD-10-CM | POA: Diagnosis present

## 2024-06-28 DIAGNOSIS — I480 Paroxysmal atrial fibrillation: Secondary | ICD-10-CM | POA: Diagnosis present

## 2024-06-28 DIAGNOSIS — G8929 Other chronic pain: Secondary | ICD-10-CM | POA: Diagnosis present

## 2024-06-28 DIAGNOSIS — I213 ST elevation (STEMI) myocardial infarction of unspecified site: Principal | ICD-10-CM

## 2024-06-28 DIAGNOSIS — R040 Epistaxis: Secondary | ICD-10-CM | POA: Diagnosis not present

## 2024-06-28 HISTORY — DX: Type 2 diabetes mellitus without complications: E11.9

## 2024-06-28 LAB — POCT I-STAT 7, (LYTES, BLD GAS, ICA,H+H)
Acid-base deficit: 3 mmol/L — ABNORMAL HIGH (ref 0.0–2.0)
Acid-base deficit: 3 mmol/L — ABNORMAL HIGH (ref 0.0–2.0)
Acid-base deficit: 4 mmol/L — ABNORMAL HIGH (ref 0.0–2.0)
Acid-base deficit: 3 mmol/L — ABNORMAL HIGH (ref 0.0–2.0)
Acid-base deficit: 3 mmol/L — ABNORMAL HIGH (ref 0.0–2.0)
Bicarbonate: 21.7 mmol/L (ref 20.0–28.0)
Bicarbonate: 24.4 mmol/L (ref 20.0–28.0)
Bicarbonate: 26.8 mmol/L (ref 20.0–28.0)
Bicarbonate: 23.6 mmol/L (ref 20.0–28.0)
Bicarbonate: 22.8 mmol/L (ref 20.0–28.0)
Calcium, Ion: 1.2 mmol/L (ref 1.15–1.40)
Calcium, Ion: 1.12 mmol/L — ABNORMAL LOW (ref 1.15–1.40)
Calcium, Ion: 1.2 mmol/L (ref 1.15–1.40)
Calcium, Ion: 1.19 mmol/L (ref 1.15–1.40)
Calcium, Ion: 1.19 mmol/L (ref 1.15–1.40)
HCT: 46 % (ref 39.0–52.0)
HCT: 44 % (ref 39.0–52.0)
HCT: 44 % (ref 39.0–52.0)
HCT: 40 % (ref 39.0–52.0)
HCT: 41 % (ref 39.0–52.0)
Hemoglobin: 15.6 g/dL (ref 13.0–17.0)
Hemoglobin: 15 g/dL (ref 13.0–17.0)
Hemoglobin: 15 g/dL (ref 13.0–17.0)
Hemoglobin: 13.6 g/dL (ref 13.0–17.0)
Hemoglobin: 13.9 g/dL (ref 13.0–17.0)
O2 Saturation: 91 %
O2 Saturation: 98 %
O2 Saturation: 99 %
O2 Saturation: 98 %
O2 Saturation: 96 %
Patient temperature: 36
Patient temperature: 35.6
Potassium: 4.4 mmol/L (ref 3.5–5.1)
Potassium: 4.7 mmol/L (ref 3.5–5.1)
Potassium: 5 mmol/L (ref 3.5–5.1)
Potassium: 5.4 mmol/L — ABNORMAL HIGH (ref 3.5–5.1)
Potassium: 5.1 mmol/L (ref 3.5–5.1)
Sodium: 136 mmol/L (ref 135–145)
Sodium: 138 mmol/L (ref 135–145)
Sodium: 142 mmol/L (ref 135–145)
Sodium: 140 mmol/L (ref 135–145)
Sodium: 140 mmol/L (ref 135–145)
TCO2: 23 mmol/L (ref 22–32)
TCO2: 26 mmol/L (ref 22–32)
TCO2: 29 mmol/L (ref 22–32)
TCO2: 25 mmol/L (ref 22–32)
TCO2: 24 mmol/L (ref 22–32)
pCO2 arterial: 38.4 mmHg (ref 32–48)
pCO2 arterial: 59 mmHg — ABNORMAL HIGH (ref 32–48)
pCO2 arterial: 70.2 mmHg (ref 32–48)
pCO2 arterial: 47.2 mmHg (ref 32–48)
pCO2 arterial: 39.4 mmHg (ref 32–48)
pH, Arterial: 7.359 (ref 7.35–7.45)
pH, Arterial: 7.19 — CL (ref 7.35–7.45)
pH, Arterial: 7.225 — ABNORMAL LOW (ref 7.35–7.45)
pH, Arterial: 7.302 — ABNORMAL LOW (ref 7.35–7.45)
pH, Arterial: 7.364 (ref 7.35–7.45)
pO2, Arterial: 64 mmHg — ABNORMAL LOW (ref 83–108)
pO2, Arterial: 121 mmHg — ABNORMAL HIGH (ref 83–108)
pO2, Arterial: 184 mmHg — ABNORMAL HIGH (ref 83–108)
pO2, Arterial: 118 mmHg — ABNORMAL HIGH (ref 83–108)
pO2, Arterial: 80 mmHg — ABNORMAL LOW (ref 83–108)

## 2024-06-28 LAB — POCT I-STAT EG7
Acid-base deficit: 3 mmol/L — ABNORMAL HIGH (ref 0.0–2.0)
Acid-base deficit: 3 mmol/L — ABNORMAL HIGH (ref 0.0–2.0)
Bicarbonate: 29.4 mmol/L — ABNORMAL HIGH (ref 20.0–28.0)
Bicarbonate: 28.9 mmol/L — ABNORMAL HIGH (ref 20.0–28.0)
Calcium, Ion: 1.27 mmol/L (ref 1.15–1.40)
Calcium, Ion: 1.25 mmol/L (ref 1.15–1.40)
HCT: 47 % (ref 39.0–52.0)
HCT: 47 % (ref 39.0–52.0)
Hemoglobin: 16 g/dL (ref 13.0–17.0)
Hemoglobin: 16 g/dL (ref 13.0–17.0)
O2 Saturation: 58 %
O2 Saturation: 56 %
Potassium: 4.9 mmol/L (ref 3.5–5.1)
Potassium: 4.8 mmol/L (ref 3.5–5.1)
Sodium: 142 mmol/L (ref 135–145)
Sodium: 142 mmol/L (ref 135–145)
TCO2: 32 mmol/L (ref 22–32)
TCO2: 31 mmol/L (ref 22–32)
pCO2, Ven: 85.9 mmHg (ref 44–60)
pCO2, Ven: 85.2 mmHg (ref 44–60)
pH, Ven: 7.142 — CL (ref 7.25–7.43)
pH, Ven: 7.139 — CL (ref 7.25–7.43)
pO2, Ven: 41 mmHg (ref 32–45)
pO2, Ven: 40 mmHg (ref 32–45)

## 2024-06-28 LAB — GLUCOSE, CAPILLARY: Glucose-Capillary: 199 mg/dL — ABNORMAL HIGH (ref 70–99)

## 2024-06-28 LAB — CBC WITH DIFFERENTIAL/PLATELET
Abs Immature Granulocytes: 0.03 K/uL (ref 0.00–0.07)
Abs Immature Granulocytes: 0.05 K/uL (ref 0.00–0.07)
Basophils Absolute: 0 K/uL (ref 0.0–0.1)
Basophils Absolute: 0 K/uL (ref 0.0–0.1)
Basophils Relative: 0 %
Basophils Relative: 0 %
Eosinophils Absolute: 0.1 K/uL (ref 0.0–0.5)
Eosinophils Absolute: 0 K/uL (ref 0.0–0.5)
Eosinophils Relative: 1 %
Eosinophils Relative: 0 %
HCT: 39.7 % (ref 39.0–52.0)
HCT: 41.1 % (ref 39.0–52.0)
Hemoglobin: 13.1 g/dL (ref 13.0–17.0)
Hemoglobin: 12.9 g/dL — ABNORMAL LOW (ref 13.0–17.0)
Immature Granulocytes: 1 %
Immature Granulocytes: 1 %
Lymphocytes Relative: 18 %
Lymphocytes Relative: 9 %
Lymphs Abs: 1.1 K/uL (ref 0.7–4.0)
Lymphs Abs: 0.5 K/uL — ABNORMAL LOW (ref 0.7–4.0)
MCH: 30 pg (ref 26.0–34.0)
MCH: 29.3 pg (ref 26.0–34.0)
MCHC: 33 g/dL (ref 30.0–36.0)
MCHC: 31.4 g/dL (ref 30.0–36.0)
MCV: 91.1 fL (ref 80.0–100.0)
MCV: 93.2 fL (ref 80.0–100.0)
Monocytes Absolute: 0.5 K/uL (ref 0.1–1.0)
Monocytes Absolute: 0.3 K/uL (ref 0.1–1.0)
Monocytes Relative: 9 %
Monocytes Relative: 4 %
Neutro Abs: 4.2 K/uL (ref 1.7–7.7)
Neutro Abs: 5.3 K/uL (ref 1.7–7.7)
Neutrophils Relative %: 71 %
Neutrophils Relative %: 86 %
Platelets: 249 K/uL (ref 150–400)
Platelets: 260 K/uL (ref 150–400)
RBC: 4.36 MIL/uL (ref 4.22–5.81)
RBC: 4.41 MIL/uL (ref 4.22–5.81)
RDW: 14.7 % (ref 11.5–15.5)
RDW: 14.6 % (ref 11.5–15.5)
WBC: 5.9 K/uL (ref 4.0–10.5)
WBC: 6.1 K/uL (ref 4.0–10.5)
nRBC: 0 % (ref 0.0–0.2)
nRBC: 0 % (ref 0.0–0.2)

## 2024-06-28 LAB — POCT I-STAT, CHEM 8
BUN: 16 mg/dL (ref 6–20)
Calcium, Ion: 1.18 mmol/L (ref 1.15–1.40)
Chloride: 109 mmol/L (ref 98–111)
Creatinine, Ser: 1.2 mg/dL (ref 0.61–1.24)
Glucose, Bld: 177 mg/dL — ABNORMAL HIGH (ref 70–99)
HCT: 40 % (ref 39.0–52.0)
Hemoglobin: 13.6 g/dL (ref 13.0–17.0)
Potassium: 3.9 mmol/L (ref 3.5–5.1)
Sodium: 144 mmol/L (ref 135–145)
TCO2: 22 mmol/L (ref 22–32)

## 2024-06-28 LAB — LIPID PANEL
Cholesterol: 109 mg/dL (ref 0–200)
HDL: 44 mg/dL (ref >40)
LDL Cholesterol: 46 mg/dL (ref 0–99)
Total CHOL/HDL Ratio: 2.5 ratio
Triglycerides: 96 mg/dL (ref <150)
VLDL: 19 mg/dL (ref 0–40)

## 2024-06-28 LAB — T4, FREE: Free T4: 1 ng/dL (ref 0.61–1.12)

## 2024-06-28 LAB — COMPREHENSIVE METABOLIC PANEL WITH GFR
ALT: 60 U/L — ABNORMAL HIGH (ref 0–44)
ALT: 58 U/L — ABNORMAL HIGH (ref 0–44)
AST: 45 U/L — ABNORMAL HIGH (ref 15–41)
AST: 72 U/L — ABNORMAL HIGH (ref 15–41)
Albumin: 3.2 g/dL — ABNORMAL LOW (ref 3.5–5.0)
Albumin: 3.1 g/dL — ABNORMAL LOW (ref 3.5–5.0)
Alkaline Phosphatase: 99 U/L (ref 38–126)
Alkaline Phosphatase: 98 U/L (ref 38–126)
Anion gap: 11 (ref 5–15)
Anion gap: 10 (ref 5–15)
BUN: 15 mg/dL (ref 6–20)
BUN: 17 mg/dL (ref 6–20)
CO2: 20 mmol/L — ABNORMAL LOW (ref 22–32)
CO2: 21 mmol/L — ABNORMAL LOW (ref 22–32)
Calcium: 8.4 mg/dL — ABNORMAL LOW (ref 8.9–10.3)
Calcium: 8.1 mg/dL — ABNORMAL LOW (ref 8.9–10.3)
Chloride: 111 mmol/L (ref 98–111)
Chloride: 109 mmol/L (ref 98–111)
Creatinine, Ser: 1.16 mg/dL (ref 0.61–1.24)
Creatinine, Ser: 1.4 mg/dL — ABNORMAL HIGH (ref 0.61–1.24)
GFR, Estimated: >60 mL/min (ref >60)
GFR, Estimated: 58 mL/min — ABNORMAL LOW (ref >60)
Glucose, Bld: 175 mg/dL — ABNORMAL HIGH (ref 70–99)
Glucose, Bld: 212 mg/dL — ABNORMAL HIGH (ref 70–99)
Potassium: 3.9 mmol/L (ref 3.5–5.1)
Potassium: 5.3 mmol/L — ABNORMAL HIGH (ref 3.5–5.1)
Sodium: 142 mmol/L (ref 135–145)
Sodium: 140 mmol/L (ref 135–145)
Total Bilirubin: 0.7 mg/dL (ref 0.0–1.2)
Total Bilirubin: 1.2 mg/dL (ref 0.0–1.2)
Total Protein: 5.9 g/dL — ABNORMAL LOW (ref 6.5–8.1)
Total Protein: 5.9 g/dL — ABNORMAL LOW (ref 6.5–8.1)

## 2024-06-28 LAB — CG4 I-STAT (LACTIC ACID)
Lactic Acid, Venous: 0.9 mmol/L (ref 0.5–1.9)
Lactic Acid, Venous: 1.1 mmol/L (ref 0.5–1.9)
Lactic Acid, Venous: 1.1 mmol/L (ref 0.5–1.9)
Lactic Acid, Venous: 1.2 mmol/L (ref 0.5–1.9)
Lactic Acid, Venous: 1.7 mmol/L (ref 0.5–1.9)

## 2024-06-28 LAB — APTT
aPTT: 29 s (ref 24–36)
aPTT: 181 s (ref 24–36)

## 2024-06-28 LAB — PROTIME-INR
INR: 1 (ref 0.8–1.2)
INR: 1.2 (ref 0.8–1.2)
Prothrombin Time: 14.1 s (ref 11.4–15.2)
Prothrombin Time: 16.1 s — ABNORMAL HIGH (ref 11.4–15.2)

## 2024-06-28 LAB — ECHOCARDIOGRAM LIMITED
Est EF: <20
Height: 71 in
Weight: 5136 [oz_av]

## 2024-06-28 LAB — TROPONIN I (HIGH SENSITIVITY)
Troponin I (High Sensitivity): 1387 ng/L (ref <18)
Troponin I (High Sensitivity): 15660 ng/L (ref <18)

## 2024-06-28 LAB — HEMOGLOBIN A1C
Hgb A1c MFr Bld: 7.8 % — ABNORMAL HIGH (ref 4.8–5.6)
Mean Plasma Glucose: 177.16 mg/dL

## 2024-06-28 LAB — POCT ACTIVATED CLOTTING TIME
Activated Clotting Time: 273 s
Activated Clotting Time: 291 s

## 2024-06-28 LAB — FIBRINOGEN: Fibrinogen: 376 mg/dL (ref 210–475)

## 2024-06-28 LAB — MAGNESIUM: Magnesium: 1.8 mg/dL (ref 1.7–2.4)

## 2024-06-28 SURGERY — CORONARY/GRAFT ACUTE MI REVASCULARIZATION
Anesthesia: Moderate Sedation

## 2024-06-28 MED ORDER — POLYETHYLENE GLYCOL 3350 17 G PO PACK
17.0000 g | PACK | Freq: Every day | ORAL | Status: DC
  Administered 2024-06-29 – 2024-06-30 (×2): 17 g
  Filled 2024-06-28 (×2): qty 1

## 2024-06-28 MED ORDER — ETOMIDATE 2 MG/ML IV SOLN
INTRAVENOUS | Status: DC | PRN
  Administered 2024-06-28: 4 mg via INTRAVENOUS
  Administered 2024-06-28: 16 mg via INTRAVENOUS

## 2024-06-28 MED ORDER — CALCIUM GLUCONATE-NACL 1-0.675 GM/50ML-% IV SOLN
1.0000 g | Freq: Once | INTRAVENOUS | Status: AC
  Administered 2024-06-28: 1000 mg via INTRAVENOUS
  Filled 2024-06-28: qty 50

## 2024-06-28 MED ORDER — FUROSEMIDE 10 MG/ML IJ SOLN
INTRAMUSCULAR | Status: AC
  Filled 2024-06-28: qty 8

## 2024-06-28 MED ORDER — VASOPRESSIN 20 UNITS/100 ML INFUSION FOR SHOCK
0.0000 [IU]/min | INTRAVENOUS | Status: DC
  Administered 2024-06-28 – 2024-06-30 (×5): 0.03 [IU]/min via INTRAVENOUS
  Filled 2024-06-28 (×3): qty 100

## 2024-06-28 MED ORDER — HEPARIN SODIUM (PORCINE) 1000 UNIT/ML IJ SOLN
INTRAMUSCULAR | Status: DC | PRN
  Administered 2024-06-28: 16000 [IU] via INTRAVENOUS

## 2024-06-28 MED ORDER — TICAGRELOR 90 MG PO TABS
ORAL_TABLET | ORAL | Status: AC
  Filled 2024-06-28: qty 2

## 2024-06-28 MED ORDER — SODIUM CHLORIDE 0.9% FLUSH: 3.0000 mL | INTRAVENOUS | Status: DC | PRN

## 2024-06-28 MED ORDER — IOHEXOL 350 MG/ML SOLN
INTRAVENOUS | Status: DC | PRN
  Administered 2024-06-28: 200 mL

## 2024-06-28 MED ORDER — MIDAZOLAM-SODIUM CHLORIDE 100-0.9 MG/100ML-% IV SOLN
INTRAVENOUS | Status: AC | PRN
  Administered 2024-06-28: 5 mg/h via INTRAVENOUS

## 2024-06-28 MED ORDER — HEPARIN (PORCINE) IN NACL 1000-0.9 UT/500ML-% IV SOLN
INTRAVENOUS | Status: DC | PRN
  Administered 2024-06-28 (×2): 500 mL

## 2024-06-28 MED ORDER — FENTANYL CITRATE (PF) 100 MCG/2ML IJ SOLN
INTRAMUSCULAR | Status: DC | PRN
  Administered 2024-06-28 (×2): 50 ug via INTRAVENOUS
  Administered 2024-06-28: 100 ug via INTRAVENOUS

## 2024-06-28 MED ORDER — FENTANYL BOLUS VIA INFUSION
INTRAVENOUS | Status: DC | PRN
  Administered 2024-06-28 (×2): 50 ug via INTRAVENOUS

## 2024-06-28 MED ORDER — MIDAZOLAM HCL 2 MG/2ML IJ SOLN
INTRAMUSCULAR | Status: DC | PRN
  Administered 2024-06-28: 1 mg via INTRAVENOUS
  Administered 2024-06-28: 2 mg via INTRAVENOUS

## 2024-06-28 MED ORDER — SUCCINYLCHOLINE CHLORIDE 200 MG/10ML IV SOSY
PREFILLED_SYRINGE | INTRAVENOUS | Status: DC | PRN
  Administered 2024-06-28: 200 mg via INTRAVENOUS

## 2024-06-28 MED ORDER — VERAPAMIL HCL 2.5 MG/ML IV SOLN
INTRAVENOUS | Status: DC | PRN
  Administered 2024-06-28: 10 mL via INTRA_ARTERIAL

## 2024-06-28 MED ORDER — MIDAZOLAM BOLUS VIA INFUSION: 0.0000 mg | INTRAVENOUS | Status: DC | PRN

## 2024-06-28 MED ORDER — TIROFIBAN HCL IN NACL 5-0.9 MG/100ML-% IV SOLN
INTRAVENOUS | Status: AC
Start: 2024-06-28 — End: 2024-06-28
  Filled 2024-06-28: qty 100

## 2024-06-28 MED ORDER — HYDRALAZINE HCL 20 MG/ML IJ SOLN: 10.0000 mg | INTRAMUSCULAR | Status: DC | PRN

## 2024-06-28 MED ORDER — ORAL CARE MOUTH RINSE: 15.0000 mL | OROMUCOSAL | Status: DC | PRN

## 2024-06-28 MED ORDER — ACETAMINOPHEN 325 MG PO TABS
650.0000 mg | ORAL_TABLET | ORAL | Status: DC | PRN
Start: 2024-06-28 — End: 2024-06-29

## 2024-06-28 MED ORDER — ASPIRIN 81 MG PO CHEW
81.0000 mg | CHEWABLE_TABLET | Freq: Every day | ORAL | Status: DC
  Administered 2024-06-29: 81 mg via ORAL
  Filled 2024-06-28: qty 1

## 2024-06-28 MED ORDER — LABETALOL HCL 5 MG/ML IV SOLN: 10.0000 mg | INTRAVENOUS | Status: DC | PRN

## 2024-06-28 MED ORDER — FAMOTIDINE IN NACL 20-0.9 MG/50ML-% IV SOLN
20.0000 mg | Freq: Two times a day (BID) | INTRAVENOUS | Status: DC
  Administered 2024-06-28 – 2024-07-02 (×8): 20 mg via INTRAVENOUS
  Filled 2024-06-28 (×8): qty 50

## 2024-06-28 MED ORDER — TIROFIBAN HCL IN NACL 5-0.9 MG/100ML-% IV SOLN
INTRAVENOUS | Status: DC | PRN
  Administered 2024-06-28: .15 ug/kg/min via INTRAVENOUS

## 2024-06-28 MED ORDER — NOREPINEPHRINE 16 MG/250ML-% IV SOLN
0.0000 ug/min | INTRAVENOUS | Status: DC
  Administered 2024-06-28: 10 ug/min via INTRAVENOUS
  Administered 2024-06-29: 9 ug/min via INTRAVENOUS
  Administered 2024-06-30: 1 ug/min via INTRAVENOUS
  Filled 2024-06-28 (×2): qty 250

## 2024-06-28 MED ORDER — MIDAZOLAM HCL 2 MG/2ML IJ SOLN
INTRAMUSCULAR | Status: AC
  Filled 2024-06-28: qty 2

## 2024-06-28 MED ORDER — LIDOCAINE HCL (PF) 1 % IJ SOLN
INTRAMUSCULAR | Status: DC | PRN
  Administered 2024-06-28: 2 mL

## 2024-06-28 MED ORDER — FENTANYL 2500MCG IN NS 250ML (10MCG/ML) PREMIX INFUSION
INTRAVENOUS | Status: AC
  Filled 2024-06-28: qty 250

## 2024-06-28 MED ORDER — VERAPAMIL HCL 2.5 MG/ML IV SOLN
INTRAVENOUS | Status: AC
Start: 2024-06-28 — End: 2024-06-28
  Filled 2024-06-28: qty 2

## 2024-06-28 MED ORDER — ATORVASTATIN CALCIUM 80 MG PO TABS
80.0000 mg | ORAL_TABLET | Freq: Every day | ORAL | Status: DC
  Administered 2024-06-29: 80 mg via ORAL
  Filled 2024-06-28: qty 1

## 2024-06-28 MED ORDER — NOREPINEPHRINE 4 MG/250ML-% IV SOLN: 0.0000 ug/min | INTRAVENOUS | Status: DC

## 2024-06-28 MED ORDER — FENTANYL 2500MCG IN NS 250ML (10MCG/ML) PREMIX INFUSION
INTRAVENOUS | Status: AC | PRN
  Administered 2024-06-28: 100 ug/h via INTRAVENOUS

## 2024-06-28 MED ORDER — NOREPINEPHRINE 4 MG/250ML-% IV SOLN
INTRAVENOUS | Status: AC
  Filled 2024-06-28: qty 250

## 2024-06-28 MED ORDER — TIROFIBAN (AGGRASTAT) BOLUS VIA INFUSION
INTRAVENOUS | Status: DC | PRN
  Administered 2024-06-28: 3640 ug via INTRAVENOUS

## 2024-06-28 MED ORDER — PROTAMINE SULFATE 10 MG/ML IV SOLN
INTRAVENOUS | Status: AC
  Filled 2024-06-28: qty 5

## 2024-06-28 MED ORDER — FENTANYL CITRATE (PF) 100 MCG/2ML IJ SOLN
INTRAMUSCULAR | Status: AC
  Filled 2024-06-28: qty 2

## 2024-06-28 MED ORDER — ONDANSETRON HCL 4 MG/2ML IJ SOLN: 4.0000 mg | Freq: Four times a day (QID) | INTRAMUSCULAR | Status: DC | PRN

## 2024-06-28 MED ORDER — DOCUSATE SODIUM 100 MG PO CAPS
100.0000 mg | ORAL_CAPSULE | Freq: Two times a day (BID) | ORAL | Status: DC | PRN
Start: 2024-06-28 — End: 2024-06-29

## 2024-06-28 MED ORDER — FENTANYL CITRATE (PF) 100 MCG/2ML IJ SOLN
INTRAMUSCULAR | Status: AC
Start: 2024-06-28 — End: 2024-06-28
  Filled 2024-06-28: qty 2

## 2024-06-28 MED ORDER — SODIUM ZIRCONIUM CYCLOSILICATE 10 G PO PACK
10.0000 g | PACK | Freq: Once | ORAL | Status: AC
  Administered 2024-06-28: 10 g via ORAL
  Filled 2024-06-28: qty 1

## 2024-06-28 MED ORDER — MIDAZOLAM HCL 2 MG/2ML IJ SOLN: 1.0000 mg | INTRAMUSCULAR | Status: DC | PRN

## 2024-06-28 MED ORDER — FENTANYL 2500MCG IN NS 250ML (10MCG/ML) PREMIX INFUSION
0.0000 ug/h | INTRAVENOUS | Status: DC
  Administered 2024-06-28 – 2024-07-01 (×8): 200 ug/h via INTRAVENOUS
  Filled 2024-06-28 (×7): qty 250

## 2024-06-28 MED ORDER — FUROSEMIDE 10 MG/ML IJ SOLN
INTRAMUSCULAR | Status: DC | PRN
  Administered 2024-06-28 (×2): 80 mg via INTRAVENOUS

## 2024-06-28 MED ORDER — SODIUM CHLORIDE 0.9 % IV SOLN
INTRAVENOUS | Status: AC | PRN
  Administered 2024-06-28: 10 mL/h via INTRAVENOUS

## 2024-06-28 MED ORDER — SODIUM CHLORIDE 0.9 % IV SOLN: 250.0000 mL | INTRAVENOUS | Status: AC | PRN

## 2024-06-28 MED ORDER — NITROPRUSSIDE SODIUM-NACL 20-0.9 MG/100ML-% IV SOLN
0.0000 ug/kg/min | INTRAVENOUS | Status: DC
  Filled 2024-06-28: qty 100

## 2024-06-28 MED ORDER — ORAL CARE MOUTH RINSE
15.0000 mL | OROMUCOSAL | Status: DC
  Administered 2024-06-28 – 2024-07-02 (×44): 15 mL via OROMUCOSAL

## 2024-06-28 MED ORDER — DOBUTAMINE-DEXTROSE 4-5 MG/ML-% IV SOLN
2.5000 ug/kg/min | INTRAVENOUS | Status: DC
  Administered 2024-06-28 – 2024-07-04 (×6): 5 ug/kg/min via INTRAVENOUS
  Administered 2024-07-05: 2.5 ug/kg/min via INTRAVENOUS
  Filled 2024-06-28 (×8): qty 250

## 2024-06-28 MED ORDER — NITROPRUSSIDE SODIUM-NACL 20-0.9 MG/100ML-% IV SOLN
INTRAVENOUS | Status: DC | PRN
  Administered 2024-06-28: .5 ug/kg/min via INTRAVENOUS

## 2024-06-28 MED ORDER — INSULIN ASPART 100 UNIT/ML IJ SOLN
0.0000 [IU] | INTRAMUSCULAR | Status: DC
  Administered 2024-06-28: 3 [IU] via SUBCUTANEOUS
  Administered 2024-06-29 (×2): 5 [IU] via SUBCUTANEOUS

## 2024-06-28 MED ORDER — MIDAZOLAM-SODIUM CHLORIDE 100-0.9 MG/100ML-% IV SOLN
INTRAVENOUS | Status: AC
  Filled 2024-06-28: qty 100

## 2024-06-28 MED ORDER — MIDAZOLAM BOLUS VIA INFUSION
INTRAVENOUS | Status: DC | PRN
  Administered 2024-06-28: 2 mg via INTRAVENOUS
  Administered 2024-06-28: 5 mg via INTRAVENOUS
  Administered 2024-06-28: 2 mg via INTRAVENOUS

## 2024-06-28 MED ORDER — PROPOFOL 1000 MG/100ML IV EMUL
0.0000 ug/kg/min | INTRAVENOUS | Status: DC
  Administered 2024-06-28: 20 ug/kg/min via INTRAVENOUS
  Administered 2024-06-29 (×2): 40 ug/kg/min via INTRAVENOUS
  Administered 2024-06-29 (×2): 30 ug/kg/min via INTRAVENOUS
  Administered 2024-06-29: 40 ug/kg/min via INTRAVENOUS
  Administered 2024-06-29: 20 ug/kg/min via INTRAVENOUS
  Administered 2024-06-29: 35 ug/kg/min via INTRAVENOUS
  Administered 2024-06-30 (×2): 15 ug/kg/min via INTRAVENOUS
  Administered 2024-06-30: 20 ug/kg/min via INTRAVENOUS
  Administered 2024-07-01: 25 ug/kg/min via INTRAVENOUS
  Administered 2024-07-01 – 2024-07-02 (×3): 15 ug/kg/min via INTRAVENOUS
  Filled 2024-06-28 (×2): qty 100
  Filled 2024-06-28: qty 300
  Filled 2024-06-28 (×10): qty 100

## 2024-06-28 MED ORDER — FENTANYL BOLUS VIA INFUSION
25.0000 ug | INTRAVENOUS | Status: DC | PRN
  Administered 2024-07-01: 50 ug via INTRAVENOUS
  Administered 2024-07-01: 25 ug via INTRAVENOUS
  Administered 2024-07-01: 50 ug via INTRAVENOUS
  Administered 2024-07-01 – 2024-07-02 (×2): 25 ug via INTRAVENOUS
  Administered 2024-07-02: 50 ug via INTRAVENOUS

## 2024-06-28 MED ORDER — MIDAZOLAM-SODIUM CHLORIDE 100-0.9 MG/100ML-% IV SOLN
0.0000 mg/h | INTRAVENOUS | Status: DC
  Administered 2024-06-28: 10 mg/h via INTRAVENOUS
  Administered 2024-06-29: 5 mg/h via INTRAVENOUS
  Filled 2024-06-28: qty 100

## 2024-06-28 MED ORDER — SODIUM CHLORIDE 0.9% FLUSH
3.0000 mL | Freq: Two times a day (BID) | INTRAVENOUS | Status: DC
  Administered 2024-06-28 – 2024-07-04 (×11): 3 mL via INTRAVENOUS
  Administered 2024-07-04: 10 mL via INTRAVENOUS
  Administered 2024-07-05 – 2024-07-08 (×4): 3 mL via INTRAVENOUS

## 2024-06-28 MED ORDER — TICAGRELOR 90 MG PO TABS
ORAL_TABLET | ORAL | Status: DC | PRN
  Administered 2024-06-28: 180 mg via ORAL

## 2024-06-28 SURGICAL SUPPLY — 19 items
BALLOON EMERGE MR 2.5X12 (BALLOONS) IMPLANT
BALLOON SAPPHIRE 2.0X15 (BALLOONS) IMPLANT
CATH 5FR JL3.5 JR4 ANG PIG MP (CATHETERS) IMPLANT
CATH EXTRAC PRONTO 5.5F 138CM (CATHETERS) IMPLANT
CATH SWAN GANZ VIP 7.5F (CATHETERS) IMPLANT
CATH VISTA GUIDE 6FR XBLD 3.5 (CATHETERS) IMPLANT
DEVICE RAD COMP TR BAND LRG (VASCULAR PRODUCTS) IMPLANT
GLIDESHEATH SLEND SS 6F .021 (SHEATH) IMPLANT
KIT HEMO VALVE WATCHDOG (MISCELLANEOUS) IMPLANT
KIT MICROPUNCTURE NIT STIFF (SHEATH) IMPLANT
PACK CARDIAC CATHETERIZATION (CUSTOM PROCEDURE TRAY) ×1 IMPLANT
SET ATX-X65L (MISCELLANEOUS) IMPLANT
SHEATH PINNACLE 8F 10CM (SHEATH) IMPLANT
SHEATH PROBE COVER 6X72 (BAG) IMPLANT
TRANSDUCER W/STOPCOCK (MISCELLANEOUS) IMPLANT
TUBING CIL FLEX 10 FLL-RA (TUBING) IMPLANT
WIRE ASAHI PROWATER 180CM (WIRE) IMPLANT
WIRE EMERALD 3MM-J .025X260CM (WIRE) IMPLANT
WIRE EMERALD 3MM-J .035X260CM (WIRE) IMPLANT

## 2024-06-28 NOTE — Progress Notes (Signed)
   06/28/24 2200  Spiritual Encounters  Type of Visit Attempt (pt unavailable)  Care provided to: Pt not available  Conversation partners present during encounter Nurse  Reason for visit  (Follow up)  OnCall Visit Yes
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
## 2024-06-28 NOTE — Progress Notes (Signed)
  Echocardiogram 2D Echocardiogram has been performed.  Andrew Brock 06/28/2024, 7:07 PM

## 2024-06-28 NOTE — Progress Notes (Signed)
   06/28/24 1725  Spiritual Encounters  Type of Visit Initial  Care provided to: Patient  Conversation partners present during encounter Nurse;Physician;Other (comment) (EMS)  Referral source Code page  Reason for visit Code  OnCall Visit Yes  Interventions  Spiritual Care Interventions Made Established relationship of care and support;Compassionate presence  Intervention Outcomes  Outcomes Connection to spiritual care    Chaplain will follow up and remains available.

## 2024-06-28 NOTE — ED Notes (Signed)
 Transported to cath lab on zoll with RN and cath lab team

## 2024-06-28 NOTE — Anesthesia Procedure Notes (Signed)
 Procedure Name: Intubation Date/Time: 06/28/2024 7:09 PM  Performed by: Teven Mittman C., CRNAPre-anesthesia Checklist: Patient identified, Emergency Drugs available, Suction available, Patient being monitored and Timeout performed Patient Re-evaluated:Patient Re-evaluated prior to induction Oxygen Delivery Method: Ambu bag Preoxygenation: Pre-oxygenation with 100% oxygen Induction Type: IV induction, Rapid sequence and Cricoid Pressure applied Ventilation: Mask ventilation without difficulty Laryngoscope Size: Glidescope and 3 Grade View: Grade I Tube type: Oral Tube size: 7.5 mm Number of attempts: 1 Airway Equipment and Method: Rigid stylet and Video-laryngoscopy Placement Confirmation: ETT inserted through vocal cords under direct vision, positive ETCO2, CO2 detector and breath sounds checked- equal and bilateral Secured at: 23 cm Tube secured with: Tape Dental Injury: Teeth and Oropharynx as per pre-operative assessment

## 2024-06-28 NOTE — ED Provider Notes (Signed)
   EMERGENCY DEPARTMENT AT [facility] Provider Note   CSN: [id number] Arrival date & time: 06/28/24  1715     Patient presents with: Chest Pain   Andrew Brock is a 63 y.o. male.   63 year old male here today with chest pain, activated as a STEMI by EMS.  Past history of diabetes, tells me that he had a heart cath in the 90s, he is unsure if he had a stent placed.  Patient was at work, was in a stressful situation, began to experience pain and pressure in the middle of his chest, began to sweat.   Chest Pain      Prior to Admission medications   Medication Sig Start Date End Date Taking? Authorizing Provider  aspirin  81 MG tablet Take 1 tablet (81 mg total) by mouth daily. 07/20/18   Murphy, Timothy D, MD  atorvastatin (LIPITOR) 10 MG tablet Take 10 mg by mouth daily.    [provider]  cyclobenzaprine  (FLEXERIL ) 10 MG tablet Take 1 tablet (10 mg total) by mouth 2 (two) times daily as needed for muscle spasms. 11/10/23   Schutt, Marsa HERO, PA-C  HYDROcodone -acetaminophen  (NORCO/VICODIN) 5-325 MG tablet Take 1 tablet by mouth every 4 (four) hours as needed. 06/19/21   Bero, Michael M, MD  metFORMIN (GLUCOPHAGE) 500 MG tablet Take by mouth 2 (two) times daily with a meal.    [provider]  naproxen  (NAPROSYN ) 500 MG tablet Take 1 tablet (500 mg total) by mouth 2 (two) times daily. 06/19/21   Bero, Michael M, MD  ondansetron  (ZOFRAN ) 4 MG tablet Take 1 tablet (4 mg total) by mouth every 8 (eight) hours as needed for nausea or vomiting. 07/20/18   Beverley Evalene JONETTA, MD    Allergies: Patient has no known allergies.    Review of Systems  Cardiovascular:  Positive for chest pain.    Updated Vital Signs Ht 5' 11 (1.803 m)   Wt (!) 145.6 kg   BMI 44.77 kg/m   Physical Exam Vitals reviewed.  Constitutional:      Appearance: He is ill-appearing.  Pulmonary:     Effort: No tachypnea or respiratory distress.     Breath sounds: No decreased  breath sounds.  Neurological:     Mental Status: He is alert.     (all labs ordered are listed, but only abnormal results are displayed) Labs Reviewed - No data to display  EKG: None  Radiology: No results found.   Procedures   Medications Ordered in the ED - No data to display                                  Medical Decision Making 63 year old here today STEMI activation.  Plan # patient came to the emergency room, was evaluated by myself.  Blood pressure normal, patient had repeat EKG performed.  Does have some dynamic ST changes.  Blood pressure normal, patient placed on pads.  Cath Lab was ready for patient, he rolled to Cath Lab.          Final diagnoses:  ST elevation myocardial infarction (STEMI), unspecified artery [facility])    ED Discharge Orders     None          Mannie Fairy DASEN, DO 06/28/24 1731

## 2024-06-28 NOTE — Progress Notes (Signed)
 Changed to FiO2 60%, PEEP 8. [place] 33, plat 26, driving 18

## 2024-06-28 NOTE — ED Triage Notes (Signed)
 Pt called in as a STEMI.   Substernal CP that started around 4pm. Non radiating. Diaphoretic on scene. Took nitroglycerin with no relief.  EKG changes with EMS
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
## 2024-06-28 NOTE — Progress Notes (Addendum)
 eLink Physician-Brief Progress Note Patient Name: Andrew Brock DOB: 15-Oct-1961 MRN: [id number]   Date of Service  06/28/2024  HPI/Events of Note  62/M with history of NHL, presenting to the ED with chest pain. He was subsequently found to have STEMI, was brought to the cath lab for intervention. He was found to have an occluded distal LAD. As vessel was being reopened, he developed a small perforation in the distal vessel. Pt was then reported to be increasingly agitated,  hypertensive, in respiratory distress and was subsequently sedated and intubated. He was transferred to the floor on dobutamine infusion. Bedside echo showed small effusion, no tamponade.   eICU Interventions  STEMI, s/p PCI - Procedure complicated by small distal perforation of LAD - Currently on vasopressor support - On dobutamine 5mcg/kg/min, norepinephrine 10mcg/min.  - Will continue to monitor VS.  - Serial echo - Will follow further cardiology plans/recommendations  Acute hypoxemic, hypercapneic respiratory failure - Likely due to pulmonary edema, along with sedation and possible underlying OSA/OHS(?) - Will maintain on vent support for now.  - Maintain TV 4-106ml/kg PBW (300-61ml), target plateau pressures <30 - Downtitrate FiO2 and PEEP to maintain SpO2 >92% - Will follow serial ABG, will make further vent setting changes as warranted. - Will continue sedation with midazolam , fentanyl , propofol .  - Plan for daily sedation interruption, SBT       Andrew Brock 06/28/2024, 10:04 PM   11:37 PM Notified of K 5.1, with intermittent PVCs. Placed order for calcium gluconate, one time order for lokelma. Pressor requirements slowly going up. Will add vasopressin at this time.
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
## 2024-06-29 ENCOUNTER — Other Ambulatory Visit (HOSPITAL_COMMUNITY)

## 2024-06-29 ENCOUNTER — Encounter (HOSPITAL_COMMUNITY): Payer: Self-pay | Admitting: Cardiovascular Disease

## 2024-06-29 ENCOUNTER — Inpatient Hospital Stay (HOSPITAL_COMMUNITY)

## 2024-06-29 DIAGNOSIS — I5023 Acute on chronic systolic (congestive) heart failure: Secondary | ICD-10-CM

## 2024-06-29 DIAGNOSIS — I3139 Other pericardial effusion (noninflammatory): Secondary | ICD-10-CM | POA: Diagnosis not present

## 2024-06-29 DIAGNOSIS — J9601 Acute respiratory failure with hypoxia: Secondary | ICD-10-CM

## 2024-06-29 DIAGNOSIS — J810 Acute pulmonary edema: Secondary | ICD-10-CM

## 2024-06-29 DIAGNOSIS — R570 Cardiogenic shock: Secondary | ICD-10-CM

## 2024-06-29 DIAGNOSIS — I2102 ST elevation (STEMI) myocardial infarction involving left anterior descending coronary artery: Secondary | ICD-10-CM | POA: Diagnosis not present

## 2024-06-29 DIAGNOSIS — J9602 Acute respiratory failure with hypercapnia: Secondary | ICD-10-CM | POA: Diagnosis not present

## 2024-06-29 LAB — COMPREHENSIVE METABOLIC PANEL WITH GFR
ALT: 59 U/L — ABNORMAL HIGH (ref 0–44)
AST: 112 U/L — ABNORMAL HIGH (ref 15–41)
Albumin: 3.1 g/dL — ABNORMAL LOW (ref 3.5–5.0)
Alkaline Phosphatase: 88 U/L (ref 38–126)
Anion gap: 11 (ref 5–15)
BUN: 18 mg/dL (ref 6–20)
CO2: 22 mmol/L (ref 22–32)
Calcium: 8.8 mg/dL — ABNORMAL LOW (ref 8.9–10.3)
Chloride: 106 mmol/L (ref 98–111)
Creatinine, Ser: 1.35 mg/dL — ABNORMAL HIGH (ref 0.61–1.24)
GFR, Estimated: >60 mL/min (ref >60)
Glucose, Bld: 242 mg/dL — ABNORMAL HIGH (ref 70–99)
Potassium: 4.3 mmol/L (ref 3.5–5.1)
Sodium: 139 mmol/L (ref 135–145)
Total Bilirubin: 1.2 mg/dL (ref 0.0–1.2)
Total Protein: 5.9 g/dL — ABNORMAL LOW (ref 6.5–8.1)

## 2024-06-29 LAB — BASIC METABOLIC PANEL WITH GFR
Anion gap: 13 (ref 5–15)
BUN: 22 mg/dL — ABNORMAL HIGH (ref 6–20)
CO2: 21 mmol/L — ABNORMAL LOW (ref 22–32)
Calcium: 8.8 mg/dL — ABNORMAL LOW (ref 8.9–10.3)
Chloride: 105 mmol/L (ref 98–111)
Creatinine, Ser: 1.76 mg/dL — ABNORMAL HIGH (ref 0.61–1.24)
GFR, Estimated: 44 mL/min — ABNORMAL LOW (ref >60)
Glucose, Bld: 284 mg/dL — ABNORMAL HIGH (ref 70–99)
Potassium: 4.5 mmol/L (ref 3.5–5.1)
Sodium: 139 mmol/L (ref 135–145)

## 2024-06-29 LAB — ECHOCARDIOGRAM COMPLETE
AR max vel: 3.05 cm2
AV Area VTI: 3.14 cm2
AV Area mean vel: 2.91 cm2
AV Mean grad: 3 mmHg
AV Peak grad: 4.2 mmHg
Ao pk vel: 1.02 m/s
Area-P 1/2: 5.34 cm2
Est EF: <20
Height: 71 in
S' Lateral: 5.1 cm
Weight: 5136 [oz_av]

## 2024-06-29 LAB — CBC WITH DIFFERENTIAL/PLATELET
Abs Immature Granulocytes: 0.05 K/uL (ref 0.00–0.07)
Basophils Absolute: 0 K/uL (ref 0.0–0.1)
Basophils Relative: 0 %
Eosinophils Absolute: 0 K/uL (ref 0.0–0.5)
Eosinophils Relative: 0 %
HCT: 38 % — ABNORMAL LOW (ref 39.0–52.0)
Hemoglobin: 12.5 g/dL — ABNORMAL LOW (ref 13.0–17.0)
Immature Granulocytes: 1 %
Lymphocytes Relative: 10 %
Lymphs Abs: 1 K/uL (ref 0.7–4.0)
MCH: 30.3 pg (ref 26.0–34.0)
MCHC: 32.9 g/dL (ref 30.0–36.0)
MCV: 92.2 fL (ref 80.0–100.0)
Monocytes Absolute: 1.2 K/uL — ABNORMAL HIGH (ref 0.1–1.0)
Monocytes Relative: 12 %
Neutro Abs: 7.6 K/uL (ref 1.7–7.7)
Neutrophils Relative %: 77 %
Platelets: 263 K/uL (ref 150–400)
RBC: 4.12 MIL/uL — ABNORMAL LOW (ref 4.22–5.81)
RDW: 14.6 % (ref 11.5–15.5)
WBC: 9.9 K/uL (ref 4.0–10.5)
nRBC: 0 % (ref 0.0–0.2)

## 2024-06-29 LAB — POCT I-STAT 7, (LYTES, BLD GAS, ICA,H+H)
Acid-base deficit: 1 mmol/L (ref 0.0–2.0)
Bicarbonate: 23.3 mmol/L (ref 20.0–28.0)
Calcium, Ion: 1.17 mmol/L (ref 1.15–1.40)
HCT: 37 % — ABNORMAL LOW (ref 39.0–52.0)
Hemoglobin: 12.6 g/dL — ABNORMAL LOW (ref 13.0–17.0)
O2 Saturation: 98 %
Patient temperature: 37.5
Potassium: 4.3 mmol/L (ref 3.5–5.1)
Sodium: 140 mmol/L (ref 135–145)
TCO2: 24 mmol/L (ref 22–32)
pCO2 arterial: 37.6 mmHg (ref 32–48)
pH, Arterial: 7.403 (ref 7.35–7.45)
pO2, Arterial: 112 mmHg — ABNORMAL HIGH (ref 83–108)

## 2024-06-29 LAB — CG4 I-STAT (LACTIC ACID)
Lactic Acid, Venous: 1 mmol/L (ref 0.5–1.9)
Lactic Acid, Venous: 1.6 mmol/L (ref 0.5–1.9)
Lactic Acid, Venous: 1.5 mmol/L (ref 0.5–1.9)

## 2024-06-29 LAB — GLUCOSE, CAPILLARY
Glucose-Capillary: 229 mg/dL — ABNORMAL HIGH (ref 70–99)
Glucose-Capillary: 219 mg/dL — ABNORMAL HIGH (ref 70–99)
Glucose-Capillary: 224 mg/dL — ABNORMAL HIGH (ref 70–99)
Glucose-Capillary: 252 mg/dL — ABNORMAL HIGH (ref 70–99)
Glucose-Capillary: 226 mg/dL — ABNORMAL HIGH (ref 70–99)
Glucose-Capillary: 355 mg/dL — ABNORMAL HIGH (ref 70–99)

## 2024-06-29 LAB — TRIGLYCERIDES: Triglycerides: 214 mg/dL — ABNORMAL HIGH (ref <150)

## 2024-06-29 LAB — HIV ANTIBODY (ROUTINE TESTING W REFLEX): HIV Screen 4th Generation wRfx: NONREACTIVE

## 2024-06-29 LAB — TSH: TSH: 1.346 u[IU]/mL (ref 0.350–4.500)

## 2024-06-29 LAB — MRSA NEXT GEN BY PCR, NASAL: MRSA by PCR Next Gen: NOT DETECTED

## 2024-06-29 LAB — PHOSPHORUS: Phosphorus: 4.8 mg/dL — ABNORMAL HIGH (ref 2.5–4.6)

## 2024-06-29 MED ORDER — ACETAMINOPHEN 325 MG PO TABS: 650.0000 mg | ORAL_TABLET | ORAL | Status: DC | PRN

## 2024-06-29 MED ORDER — ASPIRIN 81 MG PO CHEW
81.0000 mg | CHEWABLE_TABLET | Freq: Every day | ORAL | Status: DC
  Administered 2024-06-30 – 2024-07-02 (×3): 81 mg
  Filled 2024-06-29 (×3): qty 1

## 2024-06-29 MED ORDER — PERFLUTREN LIPID MICROSPHERE
1.0000 mL | INTRAVENOUS | Status: AC | PRN
  Administered 2024-06-29: 2 mL via INTRAVENOUS

## 2024-06-29 MED ORDER — ATORVASTATIN CALCIUM 80 MG PO TABS
80.0000 mg | ORAL_TABLET | Freq: Every day | ORAL | Status: DC
  Administered 2024-06-30 – 2024-07-02 (×3): 80 mg
  Filled 2024-06-29 (×3): qty 1

## 2024-06-29 MED ORDER — CHLORHEXIDINE GLUCONATE CLOTH 2 % EX PADS
6.0000 | MEDICATED_PAD | Freq: Every day | CUTANEOUS | Status: DC
  Administered 2024-06-29 – 2024-07-08 (×10): 6 via TOPICAL

## 2024-06-29 MED ORDER — INSULIN ASPART 100 UNIT/ML IJ SOLN
0.0000 [IU] | INTRAMUSCULAR | Status: DC
  Administered 2024-06-29 (×2): 7 [IU] via SUBCUTANEOUS
  Administered 2024-06-29: 20 [IU] via SUBCUTANEOUS
  Administered 2024-06-29 – 2024-06-30 (×2): 11 [IU] via SUBCUTANEOUS
  Administered 2024-06-30: 4 [IU] via SUBCUTANEOUS
  Administered 2024-06-30: 7 [IU] via SUBCUTANEOUS
  Administered 2024-06-30: 4 [IU] via SUBCUTANEOUS
  Administered 2024-06-30: 7 [IU] via SUBCUTANEOUS
  Administered 2024-06-30: 11 [IU] via SUBCUTANEOUS
  Administered 2024-07-01: 4 [IU] via SUBCUTANEOUS
  Administered 2024-07-01: 11 [IU] via SUBCUTANEOUS
  Administered 2024-07-01: 4 [IU] via SUBCUTANEOUS
  Administered 2024-07-01: 7 [IU] via SUBCUTANEOUS
  Administered 2024-07-01: 4 [IU] via SUBCUTANEOUS
  Administered 2024-07-01: 11 [IU] via SUBCUTANEOUS
  Administered 2024-07-02 (×3): 4 [IU] via SUBCUTANEOUS

## 2024-06-29 MED ORDER — MIDAZOLAM-SODIUM CHLORIDE 100-0.9 MG/100ML-% IV SOLN: 0.0000 mg/h | INTRAVENOUS | Status: DC

## 2024-06-29 MED ORDER — FUROSEMIDE 10 MG/ML IJ SOLN
80.0000 mg | Freq: Once | INTRAMUSCULAR | Status: AC
  Administered 2024-06-29: 80 mg via INTRAVENOUS
  Filled 2024-06-29: qty 8

## 2024-06-29 NOTE — Progress Notes (Signed)
*  PRELIMINARY RESULTS* Echocardiogram 2D Echocardiogram has been performed. Dr. Zenaida present.  Andrew Brock 06/29/2024, 10:10 AM

## 2024-06-29 NOTE — Consult Note (Addendum)
 NAME:  Andrew Brock, MRN:  [id number], DOB:  1961/11/15, LOS: 1 ADMISSION DATE:  06/28/2024, CONSULTATION DATE:  06/28/2024 REFERRING MD: Zenaida, MD, CHIEF COMPLAINT: resp failure   History of Present Illness:  A 63 yr old male patient with DM-2 (HbA1c 7.8%), morbid obesity, NHL (2005), and dyslipidemia, who presented with severe CP around 7PM. EKG shows anterolateral ST elevations so the patient was taken to the cath lab. During the case, his cat showed an occluded LAD distally. During the attempt to open the vessel he developed a small perforation in the distal vessel. Echo was obtained, no pericardial effusion, and a 2.0 balloon was inflated. Following this, he became progressively hypertensive, began trying to sit up, and was intubated for respiratory distress. RHC showed RA 25, PA 70/40, and PCWP 35, though cardiac output preserved by thermodilution. ABG showed significant resp acidosis, ventilator settings were changed improve minute ventilation. Given Nipride  with improvement in PA pressures. Given Lasix  and UOP is 650 cc since then. In the ICU, he has epistaxis, and nares were packed with gauze. On Dobutamine  5 mcg/kg/min, Levophed  10 mcg/min, Propofol  20 mcg/kg/min, Versed  10 mg/hr, and Fentanyl  200 mcg/hr.  On PRVC 24/600/+5/100%. Was difficult to sedate in cath, so given Versed  high dose. No hx of smoking.   Pertinent  Medical History  DM-2, morbid obesity, NHL (2005), dyslipidemia, chronic LBP  Significant Hospital Events: Including procedures, antibiotic start and stop dates in addition to other pertinent events   7/18: admitted to CCU after LHC/RHC, intubated.   Interim History / Subjective:  Patient remained intubated and sedated Afebrile Making good amount of urine with Lasix   Objective    Blood pressure (!) 125/113, pulse (!) 113, temperature 99.5 F (37.5 C), resp. rate (!) 24, height 5' 11 (1.803 m), weight (!) 145.6 kg, SpO2 97%. PAP: (33-46)/(27-32) 35/30 CVP:  [14  mmHg-27 mmHg] 16 mmHg PCWP:  [23 mmHg] 23 mmHg CO:  [5.5 L/min-6.6 L/min] 6.6 L/min CI:  [2.1 L/min/m2-2.6 L/min/m2] 2.6 L/min/m2  Vent Mode: PRVC FiO2 (%):  [60 %-80 %] 70 % Set Rate:  [24 bmp] 24 bmp Vt Set:  [600 mL] 600 mL PEEP:  [8 cmH20] 8 cmH20 Plateau Pressure:  [24 cmH20-26 cmH20] 24 cmH20   Intake/Output Summary (Last 24 hours) at 06/29/2024 9062 Last data filed at 06/29/2024 0900 Gross per 24 hour  Intake 1048.37 ml  Output 1925 ml  Net -876.63 ml   Filed Weights   06/28/24 1718  Weight: (!) 145.6 kg    Examination: General: Crtitically ill-appearing middle-aged morbidly obese male, orally intubated HEENT: [place]/AT, eyes anicteric.  ETT and cortrak in place Neuro: Sedated, not following commands.  Eyes are closed.  Pupils 3 mm bilateral reactive to light Chest: Bilateral basal crackles, no wheezes or rhonchi Heart: Tachycardic, regular rhythm, no murmurs or gallops Abdomen: Soft, nondistended, bowel sounds present Extremities: 3+ pitting edema noted in bilateral lower extremities  Labs and images reviewed  Patient Lines/Drains/Airways Status     Active Line/Drains/Airways     Name Placement date Placement time Site Days   Arterial Line 06/28/24 Left Radial 06/28/24  1955  Radial  1   Peripheral IV 06/28/24 20 G Left Antecubital 06/28/24  1719  Antecubital  1   Sheath 06/28/24 Right Femoral;Venous 06/28/24  1920  Femoral;Venous  1   Urethral Catheter hadley rn 16 Fr. 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 Complicated LHC with LAD perforation, small pericardial effusion Continue aspirin  and statin Advanced heart failure team is following Continue dobutamine  currently at 5 mics Continue vasopressor support with Levophed  and  vasopressin  Bedside echocardiogram showed small pericardial effusion with no tamponade physiology Continue telemetry monitoring Continue Lasix , monitor intake and output  Acute respiratory failure with hypoxia and hypercapnia due to acute pulmonary edema Acute respiratory acidosis, resolved Continue lung protective ventilation VAP prevention bundle in place PAD protocol switch Versed  to propofol  Continue fentanyl  infusion with RASS goal -2/-3 Vent setting was adjusted to clear hypercapnia FiO2 was titrated down to 70%, PEEP remain at 10  Acute metabolic encephalopathy due to hypoxia and hypercapnia Minimize sedation with RASS goal 0/-1  Poorly controlled DM-2 (HbA1c 7.8%), complicated with vasculopathy Continue sliding scale insulin  and CBG goal 140-180  Morbid obesity Diet and exercise counseling as appropriate  Epistaxis, resolved Nasal packing removed  Best Practice (right click and Reselect all SmartList Selections daily)   Diet/type: NPO w/ meds via tube DVT prophylaxis systemic heparin  Pressure ulcer(s): N/A GI prophylaxis: H2B Lines: Central line and Arterial Line Foley:  Yes, and it is still needed Code Status:  full code Last date of multidisciplinary goals of care discussion [Per primary team]  Labs   CBC: Recent Labs  Lab 06/28/24 1746 06/28/24 1748 06/28/24 2100 06/28/24 2104 06/28/24 2223 06/29/24 0512 06/29/24 0827  WBC 5.9  --  6.1  --   --  9.9  --   NEUTROABS 4.2  --  5.3  --   --  7.6  --   HGB 13.1   < > 12.9* 13.6 13.9 12.5* 12.6*  HCT 39.7   < > 41.1 40.0 41.0 38.0* 37.0*  MCV 91.1  --  93.2  --   --  92.2  --   PLT 249  --  260  --   --  263  --    < > = values in this interval not displayed.    Basic Metabolic Panel: Recent Labs  Lab 06/28/24 1746 06/28/24 1748 06/28/24 1855 06/28/24 2100 06/28/24 2104 06/28/24 2223 06/29/24 0512 06/29/24 0827  NA 142 144   < > 140 140 140 139 140  K 3.9 3.9   < > 5.3* 5.4* 5.1 4.3 4.3  CL  111 109  --  109  --   --  106  --   CO2 20*  --   --  21*  --   --  22  --   GLUCOSE 175* 177*  --  212*  --   --  242*  --   BUN 15 16  --  17  --   --  18  --   CREATININE 1.16 1.20  --  1.40*  --   --  1.35*  --   CALCIUM  8.4*  --   --  8.1*  --   --  8.8*  --   MG  --   --   --  1.8  --   --   --   --   PHOS  --   --   --   --   --   --  4.8*  --    < > = values in this interval not displayed.  GFR: Estimated Creatinine Clearance: 85.1 mL/min (A) (by C-G formula based on SCr of 1.35 mg/dL (H)). Recent Labs  Lab 06/28/24 1746 06/28/24 1755 06/28/24 1941 06/28/24 2003 06/28/24 2018 06/28/24 2100 06/29/24 0512 06/29/24 0833  WBC 5.9  --   --   --   --  6.1 9.9  --   LATICACIDVEN  --    < > 1.2 1.1 1.7  --   --  1.6   < > = values in this interval not displayed.    Liver Function Tests: Recent Labs  Lab 06/28/24 1746 06/28/24 2100 06/29/24 0512  AST 45* 72* 112*  ALT 60* 58* 59*  ALKPHOS 99 98 88  BILITOT 0.7 1.2 1.2  PROT 5.9* 5.9* 5.9*  ALBUMIN  3.2* 3.1* 3.1*   No results for input(s): LIPASE, AMYLASE in the last 168 hours. No results for input(s): AMMONIA in the last 168 hours.  ABG    Component Value Date/Time   PHART 7.403 06/29/2024 0827   PCO2ART 37.6 06/29/2024 0827   PO2ART 112 (H) 06/29/2024 0827   HCO3 23.3 06/29/2024 0827   TCO2 24 06/29/2024 0827   ACIDBASEDEF 1.0 06/29/2024 0827   O2SAT 98 06/29/2024 0827     Coagulation Profile: Recent Labs  Lab 06/28/24 1746 06/28/24 2100  INR 1.0 1.2    Cardiac Enzymes: No results for input(s): CKTOTAL, CKMB, CKMBINDEX, TROPONINI in the last 168 hours.  HbA1C: Hgb A1c MFr Bld  Date/Time Value Ref Range Status  06/28/2024 05:46 PM 7.8 (H) 4.8 - 5.6 % Final    Comment:    (NOTE) Diagnosis of Diabetes The following HbA1c ranges recommended by the American Diabetes Association (ADA) may be used as an aid in the diagnosis of diabetes mellitus.  Hemoglobin              Suggested A1C NGSP%              Diagnosis  <5.7                   Non Diabetic  5.7-6.4                Pre-Diabetic  >6.4                   Diabetic  <7.0                   Glycemic control for                       adults with diabetes.      CBG: Recent Labs  Lab 06/28/24 2101 06/29/24 0509 06/29/24 0730  GLUCAP 199* 229* 219*    The patient is critically ill due to acute biventricular HFrEF with cardiogenic shock/acute respiratory failure with hypoxia and hypercapnia.  Critical care was necessary to treat or prevent imminent or life-threatening deterioration.  Critical care was time spent personally by me on the following activities: development of treatment plan with patient and/or surrogate as well as nursing, discussions with consultants, evaluation of patient's response to treatment, examination of patient, obtaining history from patient or surrogate, ordering and performing treatments and interventions, ordering and review of laboratory studies, ordering and review of radiographic studies, pulse oximetry, re-evaluation of patient's condition and participation in multidisciplinary rounds.   During this encounter critical care time was devoted to patient care services described in this note for 44 minutes.     Valinda Novas, MD [facility] See Amion for pager If no response to pager, please call [phone] until 7pm After 7pm, Please call E-link [phone]

## 2024-06-29 NOTE — Progress Notes (Signed)
 Advanced Heart Failure Rounding Note  Cardiologist: None  Chief Complaint: Cardiogenic shock Subjective:    Overnight had brief improvement in pressor requirements, now on 7 of NE, vaso 0.04, and DBA at 5. Tachycardia has worsened overnight as well.   Norva numbers personally done and reviewed, CVP 19, PA 34/27, PCWP 23, cardiac output/index 6.3/2.45. SVR 669 dynes*sec/cm-5.  Echocardiogram with worsening of pericardial effusion, potentially some early invagination of the RA, but no echocardiographic evidence of tamponade and too small to tap.  Discussed care with brother and sister in law at bedside. He lives alone, brother died about 4 years ago. Has multiple children and estranged wife in [place]. No recent change in his functional status per them, though they have not kept in close touch.    Objective:   Weight Range: (!) 145.6 kg Body mass index is 44.77 kg/m.   Vital Signs:   Temp:  [96.1 F (35.6 C)-99.5 F (37.5 C)] 99.5 F (37.5 C) (07/19 0831) Pulse Rate:  [69-122] 113 (07/19 0831) Resp:  [18-42] 24 (07/19 0831) BP: (125-184)/(97-150) 125/113 (07/18 1953) SpO2:  [90 %-100 %] 97 % (07/19 0831) Arterial Line BP: (82-111)/(54-71) 102/65 (07/19 0700) FiO2 (%):  [60 %-80 %] 70 % (07/19 0831) Weight:  [145.6 kg] 145.6 kg (07/18 1718) Last BM Date :  (pta)  Weight change: Filed Weights   06/28/24 1718  Weight: (!) 145.6 kg    Intake/Output:   Intake/Output Summary (Last 24 hours) at 06/29/2024 0833 Last data filed at 06/29/2024 0700 Gross per 24 hour  Intake 1048.37 ml  Output 1350 ml  Net -301.63 ml      Physical Exam    GENERAL:Intubated, obese PULM:  Ventilated breath sounds CARDIAC:  JVP: moderately elevated         Tachycardic rate, regular rhythm, 1+ edema, Warm and well perfused extremities. ABDOMEN: Soft, non-tender, non-distended. NEUROLOGIC: Sedated    Telemetry   Sinus tachycardia in the 100s  Medications:     Scheduled  Medications:  aspirin   81 mg Oral Daily   atorvastatin   80 mg Oral Daily   Chlorhexidine  Gluconate Cloth  6 each Topical Daily   insulin  aspart  0-15 Units Subcutaneous Q4H   nitroPRUSSide  0-1 mcg/kg/min Intravenous STAT   mouth rinse  15 mL Mouth Rinse Q2H   polyethylene glycol  17 g Per Tube Daily   sodium chloride  flush  3 mL Intravenous Q12H    Infusions:  sodium chloride      DOBUTamine  5 mcg/kg/min (06/29/24 0700)   famotidine  (PEPCID ) IV Stopped (06/28/24 2305)   fentaNYL  infusion INTRAVENOUS 200 mcg/hr (06/29/24 0700)   midazolam  5 mg/hr (06/29/24 0700)   norepinephrine  (LEVOPHED ) Adult infusion 8 mcg/min (06/29/24 0700)   propofol  (DIPRIVAN ) infusion 40 mcg/kg/min (06/29/24 0700)   vasopressin  0.03 Units/min (06/29/24 0700)    PRN Medications: sodium chloride , acetaminophen , fentaNYL , midazolam , midazolam , ondansetron  (ZOFRAN ) IV, mouth rinse, sodium chloride  flush    Patient Profile   Patient is a 63 y.o. male with a PMH of non-Hodgkin's lymphoma who presents with anterior STEMI complicated by distal LAD perforation. Now here with cardiogenic shock.  Assessment/Plan   Cardiogenic shock: Biventricular, suspect long standing given elevated PA pressures, cardiomyopathy out of proportion to ischemic disease. Lactic acid and indexes normal currently. - PAPi extremely low at 0.33 - Making dilute urine, pressor requirement coming down - If he were to need support, suspect it would have to be biventricular support, likely BiVAD - Does not appear to  have significant social support at home and given obesity is not a heart transplant candidate, marginal LVAD at best given RV failure - Will attempt to temporize medically currently, especially given normal end organ function - Pericardial effusion may be contributing, addressed as below - Aggressive IV diuresis, 80mg  IV lasix  BID  CAD with anterior STEMI: Distal LAD disease, potentially embolic with clear cut off sign. Case  complicated by distal perforation - Continue aspirin , holding P2Y12 given worsening effusion but will consider starting after he stabilizes - Consider anticoagulation post, no clear thrombus on contrast echo imaging  Pericardial effusion: Traumatic with recent perforation.  - Worsening on echo this morning, but suspect long standing cardiomyopathy somewhat protective - Too small to drain currently - Repeat tomorrow - Reach out for worsening tachycardia, pressor requirement  Acute hypoxic respiratory failure:  - Due to pulmonary edema, hypercarbia improved - Continue IV diuresis, appreciate CCM involvement - Propofol  and fentanyl  for sedation  AKI: Due to shock and cardiorenal syndrome. - Diuresis  CRITICAL CARE Performed by: Morene JINNY Brownie   Total critical care time: 60 minutes  Critical care time was exclusive of separately billable procedures and treating other patients.  Critical care was necessary to treat or prevent imminent or life-threatening deterioration.  Critical care was time spent personally by me on the following activities: development of treatment plan with patient and/or surrogate as well as nursing, discussions with consultants, evaluation of patient's response to treatment, examination of patient, obtaining history from patient or surrogate, ordering and performing treatments and interventions, ordering and review of laboratory studies, ordering and review of radiographic studies, pulse oximetry and re-evaluation of patient's condition.  Length of Stay: 1  Morene JINNY Brownie, MD  06/29/2024, 8:33 AM  Advanced Heart Failure Team Pager [phone] (M-F; 7a - 5p)  Please contact [facility] for night-coverage after hours (5p -7a ) and weekends on [email or web address]

## 2024-06-29 NOTE — Progress Notes (Signed)
   Rounding Note    Patient Name: AYDRIEN FROMAN Date of Encounter: 06/29/2024  [facility] Cardiologist: None   Subjective   NAEO. Remains intubated and sedated. RN at bedside.  Vital Signs    Vitals:   06/29/24 0615 06/29/24 0630 06/29/24 0645 06/29/24 0700  BP:      Pulse: (!) 114 (!) 116 (!) 116 (!) 115  Resp: (!) 24 (!) 24 (!) 24 (!) 24  Temp: 99.5 F (37.5 C) 99.5 F (37.5 C) 99.5 F (37.5 C) 99.5 F (37.5 C)  SpO2: 98% 98% 98% 98%  Weight:      Height:        Intake/Output Summary (Last 24 hours) at 06/29/2024 0748 Last data filed at 06/29/2024 0700 Gross per 24 hour  Intake 1048.37 ml  Output 1350 ml  Net -301.63 ml      06/28/2024    5:18 PM 11/10/2023   12:06 PM 06/19/2021    2:02 AM  Last 3 Weights  Weight (lbs) 321 lb 289 lb 280 lb  Weight (kg) 145.605 kg 131.09 kg 127.007 kg      Telemetry    ST - Personally Reviewed  ECG    Personally Reviewed  Physical Exam   GEN: No acute distress.  Intubated. Sedated. Cardiac: tachycardic. Regular rhythm. Respiratory: intubated   Assessment & Plan    #Anterior STEMI #CAD #Cardiogenic shock #Pericardial effusion Patient remains critically ill. Requiring levophed , vasopressin , dobutamine . Repeat echo pending to assess Pericardial effusion (changed to STAT)  Continue aspirin , atorvastatin      CRITICAL CARE Performed by: Nadalee Neiswender T Nalia Honeycutt   Total critical care time: 30 minutes  Critical care time was exclusive of separately billable procedures and treating other patients.  Critical care was necessary to treat or prevent imminent or life-threatening deterioration.  Critical care was time spent personally by me on the following activities: development of treatment plan with patient and/or surrogate as well as nursing, discussions with consultants, evaluation of patient's response to treatment, examination of patient, obtaining history from patient or surrogate, ordering and  performing treatments and interventions, ordering and review of laboratory studies, ordering and review of radiographic studies, pulse oximetry and re-evaluation of patient's condition.     Ole T. Cindie, MD, [facility], [facility] Cardiac Electrophysiology

## 2024-06-29 NOTE — Progress Notes (Incomplete)
{  Select Note:[id number]} 

## 2024-06-29 NOTE — Plan of Care (Signed)
  Problem: Clinical Measurements: Goal: Ability to maintain clinical measurements within normal limits will improve Outcome: Progressing Goal: Will remain free from infection Outcome: Progressing Goal: Respiratory complications will improve Outcome: Progressing Goal: Cardiovascular complication will be avoided Outcome: Progressing   Problem: Education: Goal: Knowledge of General Education information will improve Description: Including pain rating scale, medication(s)/side effects and non-pharmacologic comfort measures Outcome: Not Progressing   Problem: Health Behavior/Discharge Planning: Goal: Ability to manage health-related needs will improve Outcome: Not Progressing

## 2024-06-30 ENCOUNTER — Inpatient Hospital Stay (HOSPITAL_COMMUNITY)

## 2024-06-30 DIAGNOSIS — N179 Acute kidney failure, unspecified: Secondary | ICD-10-CM

## 2024-06-30 DIAGNOSIS — I3139 Other pericardial effusion (noninflammatory): Secondary | ICD-10-CM

## 2024-06-30 DIAGNOSIS — I5023 Acute on chronic systolic (congestive) heart failure: Secondary | ICD-10-CM | POA: Diagnosis not present

## 2024-06-30 DIAGNOSIS — J9602 Acute respiratory failure with hypercapnia: Secondary | ICD-10-CM | POA: Diagnosis not present

## 2024-06-30 DIAGNOSIS — I2102 ST elevation (STEMI) myocardial infarction involving left anterior descending coronary artery: Secondary | ICD-10-CM | POA: Diagnosis not present

## 2024-06-30 DIAGNOSIS — K7200 Acute and subacute hepatic failure without coma: Secondary | ICD-10-CM

## 2024-06-30 DIAGNOSIS — J9601 Acute respiratory failure with hypoxia: Secondary | ICD-10-CM | POA: Diagnosis not present

## 2024-06-30 LAB — BASIC METABOLIC PANEL WITH GFR
Anion gap: 10 (ref 5–15)
BUN: 33 mg/dL — ABNORMAL HIGH (ref 6–20)
CO2: 22 mmol/L (ref 22–32)
Calcium: 9 mg/dL (ref 8.9–10.3)
Chloride: 107 mmol/L (ref 98–111)
Creatinine, Ser: 1.89 mg/dL — ABNORMAL HIGH (ref 0.61–1.24)
GFR, Estimated: 40 mL/min — ABNORMAL LOW (ref >60)
Glucose, Bld: 186 mg/dL — ABNORMAL HIGH (ref 70–99)
Potassium: 4.3 mmol/L (ref 3.5–5.1)
Sodium: 139 mmol/L (ref 135–145)

## 2024-06-30 LAB — URINALYSIS, ROUTINE W REFLEX MICROSCOPIC
Bacteria, UA: NONE SEEN
Bilirubin Urine: NEGATIVE
Glucose, UA: NEGATIVE mg/dL
Ketones, ur: NEGATIVE mg/dL
Leukocytes,Ua: NEGATIVE
Nitrite: NEGATIVE
Protein, ur: 30 mg/dL — AB
RBC / HPF: >50 RBC/hpf (ref 0–5)
Specific Gravity, Urine: 1.032 — ABNORMAL HIGH (ref 1.005–1.030)
pH: 5 (ref 5.0–8.0)

## 2024-06-30 LAB — COMPREHENSIVE METABOLIC PANEL WITH GFR
ALT: 48 U/L — ABNORMAL HIGH (ref 0–44)
AST: 66 U/L — ABNORMAL HIGH (ref 15–41)
Albumin: 2.8 g/dL — ABNORMAL LOW (ref 3.5–5.0)
Alkaline Phosphatase: 76 U/L (ref 38–126)
Anion gap: 12 (ref 5–15)
BUN: 25 mg/dL — ABNORMAL HIGH (ref 6–20)
CO2: 21 mmol/L — ABNORMAL LOW (ref 22–32)
Calcium: 8.4 mg/dL — ABNORMAL LOW (ref 8.9–10.3)
Chloride: 103 mmol/L (ref 98–111)
Creatinine, Ser: 1.98 mg/dL — ABNORMAL HIGH (ref 0.61–1.24)
GFR, Estimated: 38 mL/min — ABNORMAL LOW (ref >60)
Glucose, Bld: 371 mg/dL — ABNORMAL HIGH (ref 70–99)
Potassium: 4.4 mmol/L (ref 3.5–5.1)
Sodium: 136 mmol/L (ref 135–145)
Total Bilirubin: 1.3 mg/dL — ABNORMAL HIGH (ref 0.0–1.2)
Total Protein: 5.8 g/dL — ABNORMAL LOW (ref 6.5–8.1)

## 2024-06-30 LAB — ECHOCARDIOGRAM LIMITED
Height: 71 in
Weight: 5336.9 [oz_av]

## 2024-06-30 LAB — CALCIUM, IONIZED: Calcium, Ionized, Serum: 4.8 mg/dL (ref 4.5–5.6)

## 2024-06-30 LAB — GLUCOSE, CAPILLARY
Glucose-Capillary: 251 mg/dL — ABNORMAL HIGH (ref 70–99)
Glucose-Capillary: 256 mg/dL — ABNORMAL HIGH (ref 70–99)
Glucose-Capillary: 223 mg/dL — ABNORMAL HIGH (ref 70–99)
Glucose-Capillary: 181 mg/dL — ABNORMAL HIGH (ref 70–99)
Glucose-Capillary: 180 mg/dL — ABNORMAL HIGH (ref 70–99)
Glucose-Capillary: 214 mg/dL — ABNORMAL HIGH (ref 70–99)
Glucose-Capillary: 217 mg/dL — ABNORMAL HIGH (ref 70–99)

## 2024-06-30 LAB — CBC
HCT: 36.7 % — ABNORMAL LOW (ref 39.0–52.0)
Hemoglobin: 11.9 g/dL — ABNORMAL LOW (ref 13.0–17.0)
MCH: 30 pg (ref 26.0–34.0)
MCHC: 32.4 g/dL (ref 30.0–36.0)
MCV: 92.4 fL (ref 80.0–100.0)
Platelets: 186 K/uL (ref 150–400)
RBC: 3.97 MIL/uL — ABNORMAL LOW (ref 4.22–5.81)
RDW: 14.8 % (ref 11.5–15.5)
WBC: 18.4 K/uL — ABNORMAL HIGH (ref 4.0–10.5)
nRBC: 0 % (ref 0.0–0.2)

## 2024-06-30 LAB — TRIGLYCERIDES: Triglycerides: 119 mg/dL (ref <150)

## 2024-06-30 LAB — PHOSPHORUS: Phosphorus: 4.2 mg/dL (ref 2.5–4.6)

## 2024-06-30 LAB — MAGNESIUM: Magnesium: 1.8 mg/dL (ref 1.7–2.4)

## 2024-06-30 LAB — PROCALCITONIN: Procalcitonin: 3.64 ng/mL

## 2024-06-30 MED ORDER — SENNA 8.6 MG PO TABS
2.0000 | ORAL_TABLET | Freq: Every day | ORAL | Status: DC
  Administered 2024-06-30 – 2024-07-01 (×2): 17.2 mg
  Filled 2024-06-30 (×2): qty 2

## 2024-06-30 MED ORDER — COLCHICINE 0.6 MG PO TABS
0.6000 mg | ORAL_TABLET | Freq: Every day | ORAL | Status: DC
  Administered 2024-06-30 – 2024-07-02 (×3): 0.6 mg
  Filled 2024-06-30 (×3): qty 1

## 2024-06-30 MED ORDER — PROSOURCE TF20 ENFIT COMPATIBL EN LIQD
60.0000 mL | Freq: Every day | ENTERAL | Status: DC
  Administered 2024-06-30 – 2024-07-02 (×3): 60 mL
  Filled 2024-06-30 (×3): qty 60

## 2024-06-30 MED ORDER — POLYETHYLENE GLYCOL 3350 17 G PO PACK
17.0000 g | PACK | Freq: Two times a day (BID) | ORAL | Status: DC
  Administered 2024-06-30 – 2024-07-02 (×4): 17 g
  Filled 2024-06-30 (×4): qty 1

## 2024-06-30 MED ORDER — AMIODARONE LOAD VIA INFUSION
150.0000 mg | Freq: Once | INTRAVENOUS | Status: AC
  Administered 2024-06-30: 150 mg via INTRAVENOUS
  Filled 2024-06-30: qty 83.34

## 2024-06-30 MED ORDER — ALBUMIN HUMAN 25 % IV SOLN
12.5000 g | Freq: Once | INTRAVENOUS | Status: AC
  Administered 2024-06-30: 12.5 g via INTRAVENOUS
  Filled 2024-06-30: qty 50

## 2024-06-30 MED ORDER — VITAL HP 1.0 CAL PO LIQD
1000.0000 mL | ORAL | Status: DC
  Administered 2024-06-30 – 2024-07-01 (×3): 1000 mL

## 2024-06-30 MED ORDER — INSULIN GLARGINE-YFGN 100 UNIT/ML ~~LOC~~ SOLN
15.0000 [IU] | Freq: Two times a day (BID) | SUBCUTANEOUS | Status: DC
  Administered 2024-06-30 – 2024-07-01 (×3): 15 [IU] via SUBCUTANEOUS
  Filled 2024-06-30 (×4): qty 0.15

## 2024-06-30 MED ORDER — AMIODARONE HCL IN DEXTROSE 360-4.14 MG/200ML-% IV SOLN
30.0000 mg/h | INTRAVENOUS | Status: DC
  Administered 2024-07-01 – 2024-07-07 (×14): 30 mg/h via INTRAVENOUS
  Filled 2024-06-30 (×14): qty 200

## 2024-06-30 MED ORDER — AMIODARONE HCL IN DEXTROSE 360-4.14 MG/200ML-% IV SOLN
60.0000 mg/h | INTRAVENOUS | Status: AC
  Administered 2024-06-30 – 2024-07-01 (×2): 60 mg/h via INTRAVENOUS
  Filled 2024-06-30 (×2): qty 200

## 2024-06-30 NOTE — Progress Notes (Signed)
 NAME:  Andrew Brock, MRN:  [id number], DOB:  1961/12/01, LOS: 2 ADMISSION DATE:  06/28/2024, CONSULTATION DATE:  06/28/2024 REFERRING MD: Zenaida, MD, CHIEF COMPLAINT: resp failure   History of Present Illness:  A 63 yr old male patient with DM-2 (HbA1c 7.8%), morbid obesity, NHL (2005), and dyslipidemia, who presented with severe CP around 7PM. EKG shows anterolateral ST elevations so the patient was taken to the cath lab. During the case, his cat showed an occluded LAD distally. During the attempt to open the vessel he developed a small perforation in the distal vessel. Echo was obtained, no pericardial effusion, and a 2.0 balloon was inflated. Following this, he became progressively hypertensive, began trying to sit up, and was intubated for respiratory distress. RHC showed RA 25, PA 70/40, and PCWP 35, though cardiac output preserved by thermodilution. ABG showed significant resp acidosis, ventilator settings were changed improve minute ventilation. Given Nipride  with improvement in PA pressures. Given Lasix  and UOP is 650 cc since then. In the ICU, he has epistaxis, and nares were packed with gauze. On Dobutamine  5 mcg/kg/min, Levophed  10 mcg/min, Propofol  20 mcg/kg/min, Versed  10 mg/hr, and Fentanyl  200 mcg/hr.  On PRVC 24/600/+5/100%. Was difficult to sedate in cath, so given Versed  high dose. No hx of smoking.   Pertinent  Medical History  DM-2, morbid obesity, NHL (2005), dyslipidemia, chronic LBP  Significant Hospital Events: Including procedures, antibiotic start and stop dates in addition to other pertinent events   7/18: admitted to ICU after LHC/RHC, intubated 7/19 remained intubated and sedated, urine output is decreasing with rising serum creatinine  Interim History / Subjective:  FiO2 was titrated down to 40% and PEEP to 8 Remained afebrile Urine output decreased overnight, received albumin  without much improvement, serum creatinine is trending up  Objective    Blood  pressure (!) 125/113, pulse 78, temperature 98.4 F (36.9 C), resp. rate (!) 24, height 5' 11 (1.803 m), weight (!) 151.3 kg, SpO2 97%. PAP: (19-36)/(14-33) 28/21 CVP:  [9 mmHg-23 mmHg] 15 mmHg CO:  [3.8 L/min-5.3 L/min] 4.8 L/min CI:  [1.46 L/min/m2-2.04 L/min/m2] 1.87 L/min/m2  Vent Mode: PRVC FiO2 (%):  [50 %-60 %] 50 % Set Rate:  [24 bmp] 24 bmp Vt Set:  [600 mL] 600 mL PEEP:  [10 cmH20] 10 cmH20 Plateau Pressure:  [25 cmH20-27 cmH20] 26 cmH20   Intake/Output Summary (Last 24 hours) at 06/30/2024 0942 Last data filed at 06/30/2024 0800 Gross per 24 hour  Intake 1757.15 ml  Output 1295 ml  Net 462.15 ml   Filed Weights   06/28/24 1718 06/30/24 0434  Weight: (!) 145.6 kg (!) 151.3 kg    Examination: General: Crtitically ill-appearing middle-aged morbidly obese male, orally intubated HEENT: [place]/AT, eyes anicteric.  ETT and OGT in place Neuro: Opens eyes with vocal stimuli, following simple commands Chest: Reduced air entry all over no wheezes or rhonchi Heart: Regular rate and rhythm, no murmurs or gallops Abdomen: Soft, nondistended, bowel sounds present Extremities: 2+ pitting edema noted in bilateral lower extremities  Labs and images reviewed  Patient Lines/Drains/Airways Status     Active Line/Drains/Airways     Name Placement date Placement time Site Days   Arterial Line 06/28/24 Left Radial 06/28/24  1955  Radial  2   Peripheral IV 06/28/24 20 G Left Antecubital 06/28/24  1719  Antecubital  2   Sheath 06/28/24 Right Femoral;Venous 06/28/24  1920  Femoral;Venous  2   Urethral Catheter hadley rn 16 Fr. 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 with cardiogenic shock LHC complicated with  LAD perforation status post balloon tamponade, small pericardial effusion Continue aspirin  and statin Advanced heart failure team is following Remain on dobutamine  Vasopressor requirement is improving, currently on Levophed  1 mic and vasopressin  0.01 Will repeat echocardiogram to rule out worsening pericardial effusion though vasopressor requirement is improving, tamponade physiology less likely Continue Lasix   Acute respiratory failure with hypoxia and hypercapnia due to acute pulmonary edema Acute respiratory acidosis, resolved Continue lung protective ventilation VAP prevention bundle in place Hypercapnia has cleared Bed protocol with propofol  and fentanyl  infusion with RASS goal -1 FiO2 and PEEP titrated down to 40% and 8 respectively  Acute metabolic encephalopathy due to hypoxia and hypercapnia Minimize sedation with RASS goal 0/-1  Poorly controlled DM-2 (HbA1c 7.8%), complicated with vasculopathy Blood sugars are not well-controlled Started on Lantus  15 units twice daily Continue sliding scale insulin  and CBG goal 140-180  Acute kidney injury due to ischemic ATN from shock Serum creatinine continue to rise, currently at 1.98 Avoid nephrotoxic agent Monitor intake and output  Shock liver LFTs are improving Avoid hepatotoxic agent  Morbid obesity Diet and exercise counseling as appropriate  Cloudy urine Will get UA to rule out UTI  Best Practice (right click and Reselect all SmartList Selections daily)   Diet/type: Start tube feeds DVT prophylaxis SCD Pressure ulcer(s): N/A GI prophylaxis: H2B Lines: Central line and Arterial Line Foley:  Yes, and it is still needed Code Status:  full code Last date of multidisciplinary goals of care discussion [Per primary team]  Labs   CBC: Recent Labs  Lab 06/28/24 1746 06/28/24 1748 06/28/24 2100 06/28/24 2104 06/28/24 2223 06/29/24 0512 06/29/24 0827 06/30/24 0403  WBC 5.9  --  6.1  --   --  9.9  --  18.4*   NEUTROABS 4.2  --  5.3  --   --  7.6  --   --   HGB 13.1   < > 12.9* 13.6 13.9 12.5* 12.6* 11.9*  HCT 39.7   < > 41.1 40.0 41.0 38.0* 37.0* 36.7*  MCV 91.1  --  93.2  --   --  92.2  --  92.4  PLT 249  --  260  --   --  263  --  186   < > = values in this interval not displayed.    Basic Metabolic Panel: Recent Labs  Lab 06/28/24 1746 06/28/24 1748 06/28/24 1855 06/28/24 2100 06/28/24 2104 06/28/24 2223 06/29/24 0512 06/29/24 0827 06/29/24 1702 06/30/24 0116  NA 142 144   < > 140   < > 140 139 140 139 136  K 3.9 3.9   < > 5.3*   < > 5.1 4.3 4.3 4.5 4.4  CL 111 109  --  109  --   --  106  --  105 103  CO2 20*  --   --  21*  --   --  22  --  21* 21*  GLUCOSE 175* 177*  --  212*  --   --  242*  --  284* 371*  BUN 15 16  --  17  --   --  18  --  22* 25*  CREATININE 1.16 1.20  --  1.40*  --   --  1.35*  --  1.76* 1.98*  CALCIUM  8.4*  --   --  8.1*  --   --  8.8*  --  8.8* 8.4*  MG  --   --   --  1.8  --   --   --   --   --   --   PHOS  --   --   --   --   --   --  4.8*  --   --   --    < > = values in this interval not displayed.   GFR: Estimated Creatinine Clearance: 59.3 mL/min (A) (by C-G formula based on SCr of 1.98 mg/dL (H)). Recent Labs  Lab 06/28/24 1746 06/28/24 1755 06/28/24 2018 06/28/24 2100 06/28/24 2105 06/29/24 0512 06/29/24 0833 06/29/24 1718 06/30/24 0403  WBC 5.9  --   --  6.1  --  9.9  --   --  18.4*  LATICACIDVEN  --    < > 1.7  --  1.0  --  1.6 1.5  --    < > = values in this interval not displayed.    Liver Function Tests: Recent Labs  Lab 06/28/24 1746 06/28/24 2100 06/29/24 0512 06/30/24 0116  AST 45* 72* 112* 66*  ALT 60* 58* 59* 48*  ALKPHOS 99 98 88 76  BILITOT 0.7 1.2 1.2 1.3*  PROT 5.9* 5.9* 5.9* 5.8*  ALBUMIN  3.2* 3.1* 3.1* 2.8*   No results for input(s): LIPASE, AMYLASE in the last 168 hours. No results for input(s): AMMONIA in the last 168 hours.  ABG    Component Value Date/Time   PHART 7.403 06/29/2024 0827    PCO2ART 37.6 06/29/2024 0827   PO2ART 112 (H) 06/29/2024 0827   HCO3 23.3 06/29/2024 0827   TCO2 24 06/29/2024 0827   ACIDBASEDEF 1.0 06/29/2024 0827   O2SAT 98 06/29/2024 0827     Coagulation Profile: Recent Labs  Lab 06/28/24 1746 06/28/24 2100  INR 1.0 1.2    Cardiac Enzymes: No results for input(s): CKTOTAL, CKMB, CKMBINDEX, TROPONINI in the last 168 hours.  HbA1C: Hgb A1c MFr Bld  Date/Time Value Ref Range Status  06/28/2024 05:46 PM 7.8 (H) 4.8 - 5.6 % Final    Comment:    (NOTE) Diagnosis of Diabetes The following HbA1c ranges recommended by the American Diabetes Association (ADA) may be used as an aid in the diagnosis of diabetes mellitus.  Hemoglobin             Suggested A1C NGSP%              Diagnosis  <5.7                   Non Diabetic  5.7-6.4                Pre-Diabetic  >6.4                   Diabetic  <7.0                   Glycemic control for                       adults with diabetes.      CBG: Recent Labs  Lab 06/29/24 1519 06/29/24 1957 06/29/24 2316 06/30/24 0407 06/30/24 0718  GLUCAP 252* 226*  355* 251* 256*    The patient is critically ill due to acute biventricular HFrEF with cardiogenic shock/acute respiratory failure with hypoxia and hypercapnia.  Critical care was necessary to treat or prevent imminent or life-threatening deterioration.  Critical care was time spent personally by me on the following activities: development of treatment plan with patient and/or surrogate as well as nursing, discussions with consultants, evaluation of patient's response to treatment, examination of patient, obtaining history from patient or surrogate, ordering and performing treatments and interventions, ordering and review of laboratory studies, ordering and review of radiographic studies, pulse oximetry, re-evaluation of patient's condition and participation in multidisciplinary rounds.   During this encounter critical care time was  devoted to patient care services described in this note for 43 minutes.     Valinda Novas, MD [facility] See Amion for pager If no response to pager, please call [phone] until 7pm After 7pm, Please call E-link [phone]

## 2024-06-30 NOTE — Progress Notes (Signed)
 Advanced Heart Failure Rounding Note  Cardiologist: None  Chief Complaint: Cardiogenic shock Subjective:    Decrease in urine output overnight as well as an increase in creatinine.  Pressor requirement significantly improved, now off NE.   Echocardiogram reviewed, certainly has an increase in pericardial effusion, but no evidence of tamponade. Given improvement in pressor requirement, heart rate, and relatively small effusion will continue to follow serially at this time.   Objective:   Weight Range: (!) 151.3 kg Body mass index is 46.52 kg/m.   Vital Signs:   Temp:  [98.4 F (36.9 C)-100.4 F (38 C)] 99 F (37.2 C) (07/20 1415) Pulse Rate:  [46-109] 93 (07/20 1415) Resp:  [20-24] 24 (07/20 1415) SpO2:  [93 %-100 %] 94 % (07/20 1415) Arterial Line BP: (86-117)/(57-71) 96/59 (07/20 1415) FiO2 (%):  [40 %-60 %] 40 % (07/20 1151) Weight:  [151.3 kg] 151.3 kg (07/20 0434) Last BM Date :  (PTA)  Weight change: Filed Weights   06/28/24 1718 06/30/24 0434  Weight: (!) 145.6 kg (!) 151.3 kg    Intake/Output:   Intake/Output Summary (Last 24 hours) at 06/30/2024 1450 Last data filed at 06/30/2024 1400 Gross per 24 hour  Intake 1748.65 ml  Output 862 ml  Net 886.65 ml      Physical Exam    GENERAL:Intubated, obese PULM:  Ventilated breath sounds CARDIAC:  JVP: mildly elevated         Regular rate, regular rhythm, trace edema, Warm and well perfused extremities. ABDOMEN: Soft, non-tender, non-distended. NEUROLOGIC: Follows commands    Telemetry   Sinus tachycardia in the 100s  Medications:     Scheduled Medications:  aspirin   81 mg Per Tube Daily   atorvastatin   80 mg Per Tube Daily   Chlorhexidine  Gluconate Cloth  6 each Topical Daily   feeding supplement (PROSource TF20)  60 mL Per Tube Daily   feeding supplement (VITAL HIGH PROTEIN)  1,000 mL Per Tube Q24H   insulin  aspart  0-20 Units Subcutaneous Q4H   insulin  glargine-yfgn  15 Units Subcutaneous  BID   mouth rinse  15 mL Mouth Rinse Q2H   polyethylene glycol  17 g Per Tube BID   senna  2 tablet Per Tube QHS   sodium chloride  flush  3 mL Intravenous Q12H    Infusions:  DOBUTamine  5 mcg/kg/min (06/30/24 1400)   famotidine  (PEPCID ) IV Stopped (06/30/24 0933)   fentaNYL  infusion INTRAVENOUS 200 mcg/hr (06/30/24 1400)   norepinephrine  (LEVOPHED ) Adult infusion Stopped (06/30/24 1039)   propofol  (DIPRIVAN ) infusion 15 mcg/kg/min (06/30/24 1427)   vasopressin  Stopped (06/30/24 0949)    PRN Medications: acetaminophen , fentaNYL , midazolam , ondansetron  (ZOFRAN ) IV, mouth rinse, sodium chloride  flush    Patient Profile   Patient is a 63 y.o. male with a PMH of non-Hodgkin's lymphoma who presents with anterior STEMI complicated by distal LAD perforation. Now here with cardiogenic shock.  Assessment/Plan   Cardiogenic shock: Biventricular, suspect long standing given elevated PA pressures, cardiomyopathy out of proportion to ischemic disease. Lactic acid normal, cardiac index moderately depressed on DBA support - PAPi still low but improved from yesterday - CVP 15, PA 29/20, improvement from yesterday - Urine output trailing off, will hold aggressive diuresis given concern for cardiac tamponade - Continue DBA 5 - If he were to need support, suspect it would have to be biventricular support, likely BiVAD - Does not appear to have significant social support at home and given obesity is not a heart transplant candidate, marginal  LVAD at best given RV failure - Will attempt to temporize medically currently, especially given normal end organ function  CAD with anterior STEMI: Distal LAD disease, potentially embolic with clear cut off sign. Case complicated by distal perforation. - Continue aspirin , holding P2Y12 given worsening effusion but will consider starting after he stabilizes - Consider anticoagulation once he is stable from an effusion standpoint, suspect this is  embolic  Pericardial effusion: Traumatic with recent perforation.  - Mild worsening on echo, moderate this morning, some slight TV variation but no other echocardiographic evidence of tamponade - Will follow with limited echo tomorrow given improvement in clinical status  Acute hypoxic respiratory failure:  - Due to pulmonary edema, hypercarbia improved - appreciate CCM involvement - Propofol  and fentanyl  for sedation - Consider extubation if no procedures planned for tomorrow  AKI: Due to shock and cardiorenal syndrome. Oliguric. - Holding diuresis given effusion, BID lytes  CRITICAL CARE Performed by: Morene JINNY Brownie   Total critical care time: 45 minutes  Critical care time was exclusive of separately billable procedures and treating other patients.  Critical care was necessary to treat or prevent imminent or life-threatening deterioration.  Critical care was time spent personally by me on the following activities: development of treatment plan with patient and/or surrogate as well as nursing, discussions with consultants, evaluation of patient's response to treatment, examination of patient, obtaining history from patient or surrogate, ordering and performing treatments and interventions, ordering and review of laboratory studies, ordering and review of radiographic studies, pulse oximetry and re-evaluation of patient's condition.  Length of Stay: 2  Morene JINNY Brownie, MD  06/30/2024, 2:50 PM  Advanced Heart Failure Team Pager [phone] (M-F; 7a - 5p)  Please contact [facility] for night-coverage after hours (5p -7a ) and weekends on [email or web address]

## 2024-06-30 NOTE — Progress Notes (Signed)
 eLink Physician-Brief Progress Note Patient Name: Andrew Brock DOB: 11/26/1961 MRN: [id number]   Date of Service  06/30/2024  HPI/Events of Note  RN called with concern of lower UO than yesterday, a total of 265cc since 7pm with last hour having 25cc UO  Patient did receive 80 IV lasix  yesterday with great UO with a little jump in creatinine from 1.35 to 1.76  On levo, vaso, dobutamine  and sedation CVP 15, C.O 5.23, C.I 2.03 and PAP 30/25 BMI 45 Fluid negative 300 cc  eICU Interventions  Decreased UO with mildly low CI Ordered a trial of albumin  25% 12.5 g     Intervention Category Intermediate Interventions: Oliguria - evaluation and management  [name] [name] 06/30/2024, 1:50 AM

## 2024-06-30 NOTE — Significant Event (Signed)
 Contacted by nurse covering Andrew Brock and was informed that patient went into atrial fibrillation with rapid ventricular rate.  I was able to confirm on ECG.  Patient was hemodynamically stable at the time despite being on inotropic support for cardiogenic shock.  Patient was given an amiodarone  bolus and started on amiodarone  drip.  Given recent coronary perforation with coronary angiography and evolving pericardial effusion, decided not to initiate anticoagulation at this time.  Anticoagulation can be considered by the day time team.  He is receiving aspirin  at this time.

## 2024-07-01 ENCOUNTER — Encounter (HOSPITAL_COMMUNITY): Payer: Self-pay | Admitting: Cardiology

## 2024-07-01 ENCOUNTER — Inpatient Hospital Stay (HOSPITAL_COMMUNITY)

## 2024-07-01 ENCOUNTER — Encounter (HOSPITAL_COMMUNITY)
Admission: EM | Disposition: A | Discharge status: Home Health | Payer: Self-pay | Source: Home / Self Care | Attending: Cardiology

## 2024-07-01 DIAGNOSIS — I314 Cardiac tamponade: Secondary | ICD-10-CM | POA: Diagnosis not present

## 2024-07-01 DIAGNOSIS — I5023 Acute on chronic systolic (congestive) heart failure: Secondary | ICD-10-CM | POA: Diagnosis not present

## 2024-07-01 DIAGNOSIS — J9602 Acute respiratory failure with hypercapnia: Secondary | ICD-10-CM | POA: Diagnosis not present

## 2024-07-01 DIAGNOSIS — I3139 Other pericardial effusion (noninflammatory): Secondary | ICD-10-CM

## 2024-07-01 DIAGNOSIS — J9601 Acute respiratory failure with hypoxia: Secondary | ICD-10-CM | POA: Diagnosis not present

## 2024-07-01 DIAGNOSIS — I2102 ST elevation (STEMI) myocardial infarction involving left anterior descending coronary artery: Secondary | ICD-10-CM | POA: Diagnosis not present

## 2024-07-01 LAB — BASIC METABOLIC PANEL WITH GFR
Anion gap: 11 (ref 5–15)
BUN: 41 mg/dL — ABNORMAL HIGH (ref 6–20)
CO2: 24 mmol/L (ref 22–32)
Calcium: 8.6 mg/dL — ABNORMAL LOW (ref 8.9–10.3)
Chloride: 103 mmol/L (ref 98–111)
Creatinine, Ser: 1.86 mg/dL — ABNORMAL HIGH (ref 0.61–1.24)
GFR, Estimated: 41 mL/min — ABNORMAL LOW (ref >60)
Glucose, Bld: 272 mg/dL — ABNORMAL HIGH (ref 70–99)
Potassium: 4.5 mmol/L (ref 3.5–5.1)
Sodium: 138 mmol/L (ref 135–145)

## 2024-07-01 LAB — COOXEMETRY PANEL
Carboxyhemoglobin: 1.3 % (ref 0.5–1.5)
Carboxyhemoglobin: 1.3 % (ref 0.5–1.5)
Carboxyhemoglobin: 0.9 % (ref 0.5–1.5)
Methemoglobin: <0.7 % (ref 0.0–1.5)
Methemoglobin: <0.7 % (ref 0.0–1.5)
Methemoglobin: <0.7 % (ref 0.0–1.5)
O2 Saturation: 51.8 %
O2 Saturation: 99.2 %
O2 Saturation: 56.9 %
Total hemoglobin: 10.6 g/dL — ABNORMAL LOW (ref 12.0–16.0)
Total hemoglobin: 12.2 g/dL (ref 12.0–16.0)
Total hemoglobin: 12 g/dL (ref 12.0–16.0)

## 2024-07-01 LAB — CBC
HCT: 33.2 % — ABNORMAL LOW (ref 39.0–52.0)
Hemoglobin: 10.7 g/dL — ABNORMAL LOW (ref 13.0–17.0)
MCH: 29.7 pg (ref 26.0–34.0)
MCHC: 32.2 g/dL (ref 30.0–36.0)
MCV: 92.2 fL (ref 80.0–100.0)
Platelets: 158 K/uL (ref 150–400)
RBC: 3.6 MIL/uL — ABNORMAL LOW (ref 4.22–5.81)
RDW: 14.9 % (ref 11.5–15.5)
WBC: 18.7 K/uL — ABNORMAL HIGH (ref 4.0–10.5)
nRBC: 0.2 % (ref 0.0–0.2)

## 2024-07-01 LAB — ECHOCARDIOGRAM LIMITED
Height: 71 in
Height: 71 in
Weight: 5280.46 [oz_av]
Weight: 5280.46 [oz_av]

## 2024-07-01 LAB — GLUCOSE, CAPILLARY
Glucose-Capillary: 150 mg/dL — ABNORMAL HIGH (ref 70–99)
Glucose-Capillary: 187 mg/dL — ABNORMAL HIGH (ref 70–99)
Glucose-Capillary: 190 mg/dL — ABNORMAL HIGH (ref 70–99)
Glucose-Capillary: 250 mg/dL — ABNORMAL HIGH (ref 70–99)
Glucose-Capillary: 255 mg/dL — ABNORMAL HIGH (ref 70–99)
Glucose-Capillary: 269 mg/dL — ABNORMAL HIGH (ref 70–99)

## 2024-07-01 LAB — CALCIUM, IONIZED: Calcium, Ionized, Serum: 5 mg/dL (ref 4.5–5.6)

## 2024-07-01 LAB — MAGNESIUM: Magnesium: 2.2 mg/dL (ref 1.7–2.4)

## 2024-07-01 LAB — PHOSPHORUS: Phosphorus: 4.7 mg/dL — ABNORMAL HIGH (ref 2.5–4.6)

## 2024-07-01 SURGERY — PERICARDIOCENTESIS
Anesthesia: LOCAL

## 2024-07-01 MED ORDER — LIDOCAINE HCL (PF) 1 % IJ SOLN
INTRAMUSCULAR | Status: DC | PRN
  Administered 2024-07-01: 5 mL via INTRADERMAL

## 2024-07-01 MED ORDER — INSULIN GLARGINE-YFGN 100 UNIT/ML ~~LOC~~ SOLN
20.0000 [IU] | Freq: Two times a day (BID) | SUBCUTANEOUS | Status: DC
  Administered 2024-07-01 – 2024-07-08 (×14): 20 [IU] via SUBCUTANEOUS
  Filled 2024-07-01 (×16): qty 0.2

## 2024-07-01 MED ORDER — INSULIN GLARGINE-YFGN 100 UNIT/ML ~~LOC~~ SOLN
5.0000 [IU] | Freq: Once | SUBCUTANEOUS | Status: AC
  Administered 2024-07-01: 5 [IU] via SUBCUTANEOUS
  Filled 2024-07-01: qty 0.05

## 2024-07-01 MED ORDER — LIDOCAINE HCL (PF) 1 % IJ SOLN
INTRAMUSCULAR | Status: AC
  Filled 2024-07-01: qty 30

## 2024-07-01 MED ORDER — HEPARIN (PORCINE) IN NACL 1000-0.9 UT/500ML-% IV SOLN
INTRAVENOUS | Status: DC | PRN
  Administered 2024-07-01: 500 mL

## 2024-07-01 MED ORDER — SODIUM CHLORIDE 0.9 % IV SOLN: INTRAVENOUS | Status: DC

## 2024-07-01 MED ORDER — MIDAZOLAM HCL 2 MG/2ML IJ SOLN
INTRAMUSCULAR | Status: AC
  Filled 2024-07-01: qty 2

## 2024-07-01 MED ORDER — INSULIN ASPART 100 UNIT/ML IJ SOLN
4.0000 [IU] | INTRAMUSCULAR | Status: DC
  Administered 2024-07-01 – 2024-07-02 (×8): 4 [IU] via SUBCUTANEOUS

## 2024-07-01 MED ORDER — SORBITOL 70 % SOLN
30.0000 mL | Freq: Once | Status: AC
  Administered 2024-07-01: 30 mL
  Filled 2024-07-01: qty 30

## 2024-07-01 MED ORDER — FUROSEMIDE 10 MG/ML IJ SOLN
80.0000 mg | Freq: Two times a day (BID) | INTRAMUSCULAR | Status: AC
  Administered 2024-07-02 (×2): 80 mg via INTRAVENOUS
  Filled 2024-07-01 (×2): qty 8

## 2024-07-01 MED ORDER — MIDAZOLAM HCL 2 MG/2ML IJ SOLN
INTRAMUSCULAR | Status: DC | PRN
  Administered 2024-07-01: 2 mg via INTRAVENOUS

## 2024-07-01 MED ORDER — PIPERACILLIN-TAZOBACTAM 3.375 G IVPB
3.3750 g | Freq: Three times a day (TID) | INTRAVENOUS | Status: AC
  Administered 2024-07-01 – 2024-07-05 (×14): 3.375 g via INTRAVENOUS
  Filled 2024-07-01 (×14): qty 50

## 2024-07-01 MED ORDER — FUROSEMIDE 10 MG/ML IJ SOLN
INTRAMUSCULAR | Status: AC
  Administered 2024-07-01: 80 mg via INTRAVENOUS
  Filled 2024-07-01: qty 8

## 2024-07-01 SURGICAL SUPPLY — 6 items
CATH INFINITI 5FR ANG PIGTAIL (CATHETERS) IMPLANT
GUIDEWIRE SAF TJ AMPL .035X180 (WIRE) IMPLANT
KIT MICROPUNCTURE NIT STIFF (SHEATH) IMPLANT
PACK CARDIAC CATHETERIZATION (CUSTOM PROCEDURE TRAY) IMPLANT
TRAY PERICARDIOCENTESIS 6FX60 (TRAY / TRAY PROCEDURE) IMPLANT
WIRE EMERALD 3MM-J .035X150CM (WIRE) IMPLANT

## 2024-07-01 NOTE — H&P (View-Only) (Signed)
 Advanced Heart Failure Rounding Note  Cardiologist: None  [facility] Consulting MD: Dr. Zenaida Chief Complaint: Cardiogenic shock Patient Profile   63 y.o. male with a PMH of non-Hodgkin's lymphoma who presents with anterior STEMI complicated by distal LAD perforation. Now with CGS.  Subjective:    Coox 52%. CVP 17. PA 29/22, PAPi 0.53. On DBA 5. Off pressors at the moment, low dose NE overnight. In AF RVR overnight, given amio bolus + gtt with return to NSR.  Minimal UOP, net + 2L. sCr stable 1.8 WBC up; Procal 3, started on Zosyn   Intubated, awake. Anxious. Following commands.   Objective:    Weight Range: (!) 149.7 kg Body mass index is 46.03 kg/m.   Vital Signs:   Temp:  [98.1 F (36.7 C)-99.3 F (37.4 C)] 98.8 F (37.1 C) (07/21 0700) Pulse Rate:  [46-124] 86 (07/21 0700) Resp:  [18-27] 24 (07/21 0700) SpO2:  [91 %-100 %] 94 % (07/21 0700) Arterial Line BP: (75-148)/(49-88) 107/63 (07/21 0700) FiO2 (%):  [40 %-50 %] 40 % (07/21 0436) Weight:  [149.7 kg] 149.7 kg (07/21 0315) Last BM Date :  (PTA)  Weight change: Filed Weights   06/28/24 1718 06/30/24 0434 07/01/24 0315  Weight: (!) 145.6 kg (!) 151.3 kg (!) 149.7 kg   Intake/Output:  Intake/Output Summary (Last 24 hours) at 07/01/2024 0726 Last data filed at 07/01/2024 0700 Gross per 24 hour  Intake 2626.07 ml  Output 631 ml  Net 1995.07 ml    Physical Exam    CVP 17-18 General: Acutely-ill appearing.  Cardiac: S1 and S2 present.  Abdomen: Soft, non-tender, non-distended.  Extremities: Cool and dry.  1+ BLE edema.  Neuro: Awake on vent. Following commands. Anxious   Telemetry   In AF overnight, SR around 0545, frequent PVCs 15-16/min (personally reviewed)  Medications:    Scheduled Medications:  aspirin   81 mg Per Tube Daily   atorvastatin   80 mg Per Tube Daily   Chlorhexidine  Gluconate Cloth  6 each Topical Daily   colchicine   0.6 mg Per Tube Daily   feeding supplement (PROSource TF20)  60 mL  Per Tube Daily   feeding supplement (VITAL HIGH PROTEIN)  1,000 mL Per Tube Q24H   insulin  aspart  0-20 Units Subcutaneous Q4H   insulin  glargine-yfgn  15 Units Subcutaneous BID   mouth rinse  15 mL Mouth Rinse Q2H   polyethylene glycol  17 g Per Tube BID   senna  2 tablet Per Tube QHS   sodium chloride  flush  3 mL Intravenous Q12H    Infusions:  amiodarone  30 mg/hr (07/01/24 0700)   DOBUTamine  5 mcg/kg/min (07/01/24 0700)   famotidine  (PEPCID ) IV Stopped (06/30/24 2142)   fentaNYL  infusion INTRAVENOUS 200 mcg/hr (07/01/24 0700)   norepinephrine  (LEVOPHED ) Adult infusion Stopped (07/01/24 9378)   propofol  (DIPRIVAN ) infusion 15 mcg/kg/min (07/01/24 0700)   vasopressin  Stopped (07/01/24 0515)    PRN Medications: acetaminophen , fentaNYL , midazolam , ondansetron  (ZOFRAN ) IV, mouth rinse, sodium chloride  flush  Assessment/Plan   Acute HFrEF with Cardiogenic shock: Biventricular, EF 20-25%, with mildly reduced RV. Suspect long standing given elevated PA pressures, cardiomyopathy out of proportion to ischemic disease. LA normal, however reduced CO/CI on PAC.  - CO/CI low on PAC. CI (TD) 1.5. PAP 29/22. PAPi remains low 0.54 - Co-ox 52. CI (fick) ~2. CVP 18. Continue DBA. - Repeat echo with increase pericardial effusion and worsening hemodynamics. Plan for pericardiocentesis.  - Elevated filling pressures. Will plan to diurese after pericardiocentesis - Does  not appear to have significant social support at home and given obesity is not a heart transplant candidate, marginal LVAD at best given RV failure  CAD with anterior STEMI: Distal LAD disease, potentially embolic with clear cut off sign. Case complicated by distal perforation. - Continue aspirin . Hold P2Y12 with perforation and growing pericardial effusion - Consider anticoagulation once he is stable from an effusion standpoint, suspect this is embolic  Pericardial effusion: Traumatic with recent perforation.  - Mild worsening on  echo yesterday some slight TV variation but no other evidence of tamponade - Echo today with increased circumferential effusion without signs of tamponade on echo, however with worsening hemodynamics - Plan for pericardiocentesis today.    Acute hypoxic respiratory failure:  - Due to pulmonary edema, hypercarbia improved - intubated; appreciate CCM involvement - concern for possible aspiration PNA with leukocytosis WBC 18 and Procal 3 - started on zosyn  - would not extubated today, plan to diurese this afternoon.   AKI: Due to shock and cardiorenal syndrome. Oliguric. - Cr now stable at 1.8 - Plan to diurese this afternoon  Length of Stay: 3  CRITICAL CARE Performed by: [place] Nekia Maxham  Total critical care time: 14 minutes  -Critical care time was exclusive of separately billable procedures and treating other patients. -Critical care was necessary to treat or prevent imminent or life-threatening deterioration. -Critical care was time spent personally by me on the following activities: development of treatment plan with patient and/or surrogate as well as nursing, discussions with consultants, evaluation of patient's response to treatment, examination of patient, obtaining history from patient or surrogate, ordering and performing treatments and interventions, ordering and review of laboratory studies, ordering and review of radiographic studies, pulse oximetry and re-evaluation of patient's condition.  [place] Rileyann Florance, NP  07/01/2024, 7:26 AM  Advanced Heart Failure Team Pager [phone] (M-F; 7a - 5p)  Please contact [facility] for night-coverage after hours (5p -7a ) and weekends on [email or web address]

## 2024-07-01 NOTE — Plan of Care (Signed)
  Problem: Clinical Measurements: Goal: Ability to maintain clinical measurements within normal limits will improve Outcome: Progressing Goal: Will remain free from infection Outcome: Progressing Goal: Diagnostic test results will improve Outcome: Progressing Goal: Respiratory complications will improve Outcome: Progressing   Problem: Education: Goal: Knowledge of General Education information will improve Description: Including pain rating scale, medication(s)/side effects and non-pharmacologic comfort measures Outcome: Not Progressing   Problem: Health Behavior/Discharge Planning: Goal: Ability to manage health-related needs will improve Outcome: Not Progressing

## 2024-07-01 NOTE — Progress Notes (Signed)
 NAME:  Andrew Brock, MRN:  [id number], DOB:  1961-06-08, LOS: 3 ADMISSION DATE:  06/28/2024, CONSULTATION DATE:  06/28/2024 REFERRING MD: Zenaida, MD, CHIEF COMPLAINT: resp failure   History of Present Illness:  A 63 yr old male patient with DM-2 (HbA1c 7.8%), morbid obesity, NHL (2005), and dyslipidemia, who presented with severe CP around 7PM. EKG shows anterolateral ST elevations so the patient was taken to the cath lab. During the case, his cat showed an occluded LAD distally. During the attempt to open the vessel he developed a small perforation in the distal vessel. Echo was obtained, no pericardial effusion, and a 2.0 balloon was inflated. Following this, he became progressively hypertensive, began trying to sit up, and was intubated for respiratory distress. RHC showed RA 25, PA 70/40, and PCWP 35, though cardiac output preserved by thermodilution. ABG showed significant resp acidosis, ventilator settings were changed improve minute ventilation. Given Nipride  with improvement in PA pressures. Given Lasix  and UOP is 650 cc since then. In the ICU, he has epistaxis, and nares were packed with gauze. On Dobutamine  5 mcg/kg/min, Levophed  10 mcg/min, Propofol  20 mcg/kg/min, Versed  10 mg/hr, and Fentanyl  200 mcg/hr.  On PRVC 24/600/+5/100%. Was difficult to sedate in cath, so given Versed  high dose. No hx of smoking.   Pertinent  Medical History  DM-2, morbid obesity, NHL (2005), dyslipidemia, chronic LBP  Significant Hospital Events: Including procedures, antibiotic start and stop dates in addition to other pertinent events   7/18: admitted to ICU after LHC/RHC, intubated 7/19 remained intubated and sedated, urine output is decreasing with rising serum creatinine 7/20 decreased urine output, serum creatinine started trending up, FiO2 titrated down to 40% PEEP at 8  Interim History / Subjective:  Patient remained afebrile but white count remain elevated  tolerating spontaneous breathing  trial Went into A-fib with RVR overnight, now converted back to sinus rhythm  Objective    Blood pressure (!) 125/113, pulse 84, temperature 98.8 F (37.1 C), temperature source Core, resp. rate (!) 24, height 5' 11 (1.803 m), weight (!) 149.7 kg, SpO2 94%. PAP: (23-36)/(18-31) 29/21 CVP:  [11 mmHg-20 mmHg] 13 mmHg CO:  [3.9 L/min-4.6 L/min] 3.9 L/min CI:  [1.5 L/min/m2-1.8 L/min/m2] 1.52 L/min/m2  Vent Mode: PRVC FiO2 (%):  [40 %] 40 % Set Rate:  [24 bmp] 24 bmp Vt Set:  [600 mL] 600 mL PEEP:  [8 cmH20] 8 cmH20 Plateau Pressure:  [22 cmH20-24 cmH20] 22 cmH20   Intake/Output Summary (Last 24 hours) at 07/01/2024 9077 Last data filed at 07/01/2024 0800 Gross per 24 hour  Intake 2692.68 ml  Output 591 ml  Net 2101.68 ml   Filed Weights   06/28/24 1718 06/30/24 0434 07/01/24 0315  Weight: (!) 145.6 kg (!) 151.3 kg (!) 149.7 kg    Examination: General: Crtitically ill-appearing male, orally intubated HEENT: [place]/AT, eyes anicteric.  ETT and OGT in place Neuro: Eyes closed, following simple commands Chest: Coarse breath sounds, no wheezes or rhonchi Heart: Regular rate and rhythm, no murmurs or gallops.  2+ pitting edema noted Abdomen: Soft, nondistended, bowel sounds present  Labs and images reviewed  Patient Lines/Drains/Airways Status     Active Line/Drains/Airways     Name Placement date Placement time Site Days   Arterial Line 06/28/24 Left Radial 06/28/24  1955  Radial  3   Peripheral IV 06/28/24 20 G Left Antecubital 06/28/24  1719  Antecubital  3   Sheath 06/28/24 Right Femoral;Venous 06/28/24  1920  Femoral;Venous  3  NG/OG Vented/Dual Lumen Oral External length of tube 43 cm 06/29/24  --  Oral  2   Urethral Catheter hadley rn 16 Fr. 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 with cardiogenic shock LHC complicated with LAD perforation status post balloon tamponade, small pericardial effusion Continue aspirin  and statin Advanced heart failure team is following Remain on dobutamine  Came off of vasopressor support Repeat echocardiogram showing EF 20 to 25% with RV dysfunction  Paroxysmal A-fib with RVR Patient went into A-fib RVR overnight, now converted to sinus rhythm after receiving amiodarone  bolus and was started on amiodarone  infusion Continue telemetry monitoring Not on anticoagulation in the setting of hemopericardium  Acute respiratory failure with hypoxia and hypercapnia due to acute pulmonary edema Probable aspiration pneumonia Continue lung protective ventilation VAP prevention bundle in place Patient is tolerating spontaneous breathing trial Off propofol , continue fentanyl  infusion with RASS goal 0/-1 Patient procalcitonin is elevated, white count remain elevated to 18 No he is afebrile Started on IV Zosyn   Acute metabolic encephalopathy due to hypoxia and hypercapnia Minimize sedation with RASS goal 0/-1  Poorly controlled DM-2 (HbA1c 7.8%), complicated with vasculopathy Blood sugars are not well-controlled Increase Lantus  to 20 units twice daily Continue sliding scale and tube feed coverage insulin  and CBG goal 140-180  Acute kidney injury due to ischemic ATN from shock Serum creatinine started to come down, currently at 1.76 Though urine output was 641 cc in last 24 hours net +1.9 L Avoid nephrotoxic agent Monitor intake and output  Shock liver LFTs are improving Avoid hepatotoxic agent  Morbid obesity Diet and exercise counseling as appropriate  Cloudy urine Acute UTI was ruled out  Best Practice (right click and Reselect all SmartList Selections daily)   Diet/type: Tube feeds DVT prophylaxis SCD Pressure ulcer(s): N/A GI  prophylaxis: H2B Lines: Central line and Arterial Line Foley:  Yes, and it is still needed Code Status:  full code Last date of multidisciplinary goals of care discussion [Per primary team]  Labs   CBC: Recent Labs  Lab 06/28/24 1746 06/28/24 1748 06/28/24 2100 06/28/24 2104 06/28/24 2223 06/29/24 0512 06/29/24 0827 06/30/24 0403 07/01/24 0402  WBC 5.9  --  6.1  --   --  9.9  --  18.4* 18.7*  NEUTROABS 4.2  --  5.3  --   --  7.6  --   --   --   HGB 13.1   < > 12.9*   < > 13.9 12.5* 12.6* 11.9* 10.7*  HCT 39.7   < > 41.1   < > 41.0 38.0* 37.0* 36.7* 33.2*  MCV 91.1  --  93.2  --   --  92.2  --  92.4 92.2  PLT 249  --  260  --   --  263  --  186 158   < > = values in this interval not displayed.    Basic Metabolic Panel: Recent Labs  Lab 06/28/24 2100 06/28/24 2104 06/29/24 0512 06/29/24 0827 06/29/24 1702 06/30/24 0116 06/30/24 0956 06/30/24 1600 07/01/24 0402  NA 140   < > 139 140 139 136  --  139 138  K 5.3*   < >  4.3 4.3 4.5 4.4  --  4.3 4.5  CL 109  --  106  --  105 103  --  107 103  CO2 21*  --  22  --  21* 21*  --  22 24  GLUCOSE 212*  --  242*  --  284* 371*  --  186* 272*  BUN 17  --  18  --  22* 25*  --  33* 41*  CREATININE 1.40*  --  1.35*  --  1.76* 1.98*  --  1.89* 1.86*  CALCIUM  8.1*  --  8.8*  --  8.8* 8.4*  --  9.0 8.6*  MG 1.8  --   --   --   --   --  1.8  --  2.2  PHOS  --   --  4.8*  --   --   --  4.2  --  4.7*   < > = values in this interval not displayed.   GFR: Estimated Creatinine Clearance: 62.8 mL/min (A) (by C-G formula based on SCr of 1.86 mg/dL (H)). Recent Labs  Lab 06/28/24 2018 06/28/24 2100 06/28/24 2105 06/29/24 0512 06/29/24 0833 06/29/24 1718 06/30/24 0403 06/30/24 0956 07/01/24 0402  PROCALCITON  --   --   --   --   --   --   --  3.64  --   WBC  --  6.1  --  9.9  --   --  18.4*  --  18.7*  LATICACIDVEN 1.7  --  1.0  --  1.6 1.5  --   --   --     Liver Function Tests: Recent Labs  Lab 06/28/24 1746  06/28/24 2100 06/29/24 0512 06/30/24 0116  AST 45* 72* 112* 66*  ALT 60* 58* 59* 48*  ALKPHOS 99 98 88 76  BILITOT 0.7 1.2 1.2 1.3*  PROT 5.9* 5.9* 5.9* 5.8*  ALBUMIN  3.2* 3.1* 3.1* 2.8*   No results for input(s): LIPASE, AMYLASE in the last 168 hours. No results for input(s): AMMONIA in the last 168 hours.  ABG    Component Value Date/Time   PHART 7.403 06/29/2024 0827   PCO2ART 37.6 06/29/2024 0827   PO2ART 112 (H) 06/29/2024 0827   HCO3 23.3 06/29/2024 0827   TCO2 24 06/29/2024 0827   ACIDBASEDEF 1.0 06/29/2024 0827   O2SAT 51.8 07/01/2024 0108     Coagulation Profile: Recent Labs  Lab 06/28/24 1746 06/28/24 2100  INR 1.0 1.2    Cardiac Enzymes: No results for input(s): CKTOTAL, CKMB, CKMBINDEX, TROPONINI in the last 168 hours.  HbA1C: Hgb A1c MFr Bld  Date/Time Value Ref Range Status  06/28/2024 05:46 PM 7.8 (H) 4.8 - 5.6 % Final    Comment:    (NOTE) Diagnosis of Diabetes The following HbA1c ranges recommended by the American Diabetes Association (ADA) may be used as an aid in the diagnosis of diabetes mellitus.  Hemoglobin             Suggested A1C NGSP%              Diagnosis  <5.7                   Non Diabetic  5.7-6.4                Pre-Diabetic  >6.4                   Diabetic  <7.0  Glycemic control for                       adults with diabetes.      CBG: Recent Labs  Lab 06/30/24 1937 06/30/24 2126 06/30/24 2329 07/01/24 0330 07/01/24 0747  GLUCAP 180* 214* 217* 269* 255*    The patient is critically ill due to acute biventricular HFrEF with cardiogenic shock/acute respiratory failure with hypoxia and hypercapnia.  Critical care was necessary to treat or prevent imminent or life-threatening deterioration.  Critical care was time spent personally by me on the following activities: development of treatment plan with patient and/or surrogate as well as nursing, discussions with consultants, evaluation  of patient's response to treatment, examination of patient, obtaining history from patient or surrogate, ordering and performing treatments and interventions, ordering and review of laboratory studies, ordering and review of radiographic studies, pulse oximetry, re-evaluation of patient's condition and participation in multidisciplinary rounds.   During this encounter critical care time was devoted to patient care services described in this note for 39 minutes.     Valinda Novas, MD [facility] See Amion for pager If no response to pager, please call [phone] until 7pm After 7pm, Please call E-link [phone]

## 2024-07-01 NOTE — Inpatient Diabetes Management (Signed)
 Inpatient Diabetes Program Recommendations  AACE/ADA: New Consensus Statement on Inpatient Glycemic Control (2015)  Target Ranges:  Prepandial:   less than 140 mg/dL      Peak postprandial:   less than 180 mg/dL (1-2 hours)      Critically ill patients:  140 - 180 mg/dL    Latest Reference Range & Units 06/28/24 17:46  Hemoglobin A1C 4.8 - 5.6 % 7.8 (H)  (H): Data is abnormally high  Latest Reference Range & Units 06/29/24 23:16 06/30/24 04:07 06/30/24 07:18 06/30/24 11:10 06/30/24 15:04 06/30/24 19:37 06/30/24 21:26  Glucose-Capillary 70 - 99 mg/dL 644 (H)  20 units Novolog   251 (H)  11 units Novolog   256 (H)  11 units Novolog   15 units Semglee  @0956   223 (H)  7 units Novolog   181 (H)  4 units Novolog   180 (H)  4 units Novolog   214 (H)    15 units Semglee     Latest Reference Range & Units 06/30/24 23:29 07/01/24 03:30 07/01/24 07:47  Glucose-Capillary 70 - 99 mg/dL 782 (H)  7 units Novolog   269 (H)  11 units Novolog   255 (H)  (H): Data is abnormally high   Admit with:  STEMI 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 with cardiogenic shock LHC complicated with LAD perforation status post balloon tamponade, small pericardial effusion Acute respiratory failure with hypoxia and hypercapnia due to acute pulmonary edema   History: DM2  Home DM Meds: Lantus  34 units daily       Metformin 1000 mg BID       Januvia 100 mg daily  Current Orders: Semglee  15 units BID      Novolog  Resistant Correction Scale/ SSI (0-20 units) Q4 hours     MD- Note pt getting Tube Feeds 40cc/hr  Please consider starting Novolog  Tube Feed Coverage:  Novolog  4 units Q4 hours HOLD if tube feeds HELD for any reason    --Will follow patient during hospitalization--  Adina Rudolpho Arrow RN, MSN, CDCES Diabetes Coordinator Inpatient Glycemic Control Team Team Pager: [phone] (8a-5p)

## 2024-07-01 NOTE — Interval H&P Note (Signed)
 History and Physical Interval Note:  07/01/2024 3:43 PM  Andrew Brock Presume  has presented today for surgery, with the diagnosis of pericardiocentesis.  The various methods of treatment have been discussed with the patient and family. After consideration of risks, benefits and other options for treatment, the patient has consented to  Procedure(s): PERICARDIOCENTESIS (N/A) as a surgical intervention.  The patient's history has been reviewed, patient examined, no change in status, stable for surgery.  I have reviewed the patient's chart and labs.  Questions were answered to the patient's satisfaction.   Cath Lab Visit (complete for each Cath Lab visit)   [facility] 07/01/2024 3:44 PM

## 2024-07-01 NOTE — Progress Notes (Signed)
 Advanced Heart Failure Rounding Note  Cardiologist: None  AHF Consulting MD: Dr. Zenaida Chief Complaint: Cardiogenic shock Patient Profile   63 y.o. male with a PMH of non-Hodgkin's lymphoma who presents with anterior STEMI complicated by distal LAD perforation. Now with CGS.  Subjective:    Coox 52%. CVP 17. PA 29/22, PAPi 0.53. On DBA 5. Off pressors at the moment, low dose NE overnight. In AF RVR overnight, given amio bolus + gtt with return to NSR.  Minimal UOP, net + 2L. sCr stable 1.8 WBC up; Procal 3, started on Zosyn   Intubated, awake. Anxious. Following commands.   Objective:    Weight Range: (!) 149.7 kg Body mass index is 46.03 kg/m.   Vital Signs:   Temp:  [98.1 F (36.7 C)-99.3 F (37.4 C)] 98.8 F (37.1 C) (07/21 0700) Pulse Rate:  [46-124] 86 (07/21 0700) Resp:  [18-27] 24 (07/21 0700) SpO2:  [91 %-100 %] 94 % (07/21 0700) Arterial Line BP: (75-148)/(49-88) 107/63 (07/21 0700) FiO2 (%):  [40 %-50 %] 40 % (07/21 0436) Weight:  [149.7 kg] 149.7 kg (07/21 0315) Last BM Date :  (PTA)  Weight change: Filed Weights   06/28/24 1718 06/30/24 0434 07/01/24 0315  Weight: (!) 145.6 kg (!) 151.3 kg (!) 149.7 kg   Intake/Output:  Intake/Output Summary (Last 24 hours) at 07/01/2024 0726 Last data filed at 07/01/2024 0700 Gross per 24 hour  Intake 2626.07 ml  Output 631 ml  Net 1995.07 ml    Physical Exam    CVP 17-18 General: Acutely-ill appearing.  Cardiac: S1 and S2 present.  Abdomen: Soft, non-tender, non-distended.  Extremities: Cool and dry.  1+ BLE edema.  Neuro: Awake on vent. Following commands. Anxious   Telemetry   In AF overnight, SR around 0545, frequent PVCs 15-16/min (personally reviewed)  Medications:    Scheduled Medications:  aspirin   81 mg Per Tube Daily   atorvastatin   80 mg Per Tube Daily   Chlorhexidine  Gluconate Cloth  6 each Topical Daily   colchicine   0.6 mg Per Tube Daily   feeding supplement (PROSource TF20)  60 mL  Per Tube Daily   feeding supplement (VITAL HIGH PROTEIN)  1,000 mL Per Tube Q24H   insulin  aspart  0-20 Units Subcutaneous Q4H   insulin  glargine-yfgn  15 Units Subcutaneous BID   mouth rinse  15 mL Mouth Rinse Q2H   polyethylene glycol  17 g Per Tube BID   senna  2 tablet Per Tube QHS   sodium chloride  flush  3 mL Intravenous Q12H    Infusions:  amiodarone  30 mg/hr (07/01/24 0700)   DOBUTamine  5 mcg/kg/min (07/01/24 0700)   famotidine  (PEPCID ) IV Stopped (06/30/24 2142)   fentaNYL  infusion INTRAVENOUS 200 mcg/hr (07/01/24 0700)   norepinephrine  (LEVOPHED ) Adult infusion Stopped (07/01/24 9378)   propofol  (DIPRIVAN ) infusion 15 mcg/kg/min (07/01/24 0700)   vasopressin  Stopped (07/01/24 0515)    PRN Medications: acetaminophen , fentaNYL , midazolam , ondansetron  (ZOFRAN ) IV, mouth rinse, sodium chloride  flush  Assessment/Plan   Acute HFrEF with Cardiogenic shock: Biventricular, EF 20-25%, with mildly reduced RV. Suspect long standing given elevated PA pressures, cardiomyopathy out of proportion to ischemic disease. LA normal, however reduced CO/CI on PAC.  - CO/CI low on PAC. CI (TD) 1.5. PAP 29/22. PAPi remains low 0.54 - Co-ox 52. CI (fick) ~2. CVP 18. Continue DBA. - Repeat echo with increase pericardial effusion and worsening hemodynamics. Plan for pericardiocentesis.  - Elevated filling pressures. Will plan to diurese after pericardiocentesis - Does  not appear to have significant social support at home and given obesity is not a heart transplant candidate, marginal LVAD at best given RV failure  CAD with anterior STEMI: Distal LAD disease, potentially embolic with clear cut off sign. Case complicated by distal perforation. - Continue aspirin . Hold P2Y12 with perforation and growing pericardial effusion - Consider anticoagulation once he is stable from an effusion standpoint, suspect this is embolic  Pericardial effusion: Traumatic with recent perforation.  - Mild worsening on  echo yesterday some slight TV variation but no other evidence of tamponade - Echo today with increased circumferential effusion without signs of tamponade on echo, however with worsening hemodynamics - Plan for pericardiocentesis today.    Acute hypoxic respiratory failure:  - Due to pulmonary edema, hypercarbia improved - intubated; appreciate CCM involvement - concern for possible aspiration PNA with leukocytosis WBC 18 and Procal 3 - started on zosyn  - would not extubated today, plan to diurese this afternoon.   AKI: Due to shock and cardiorenal syndrome. Oliguric. - Cr now stable at 1.8 - Plan to diurese this afternoon  Length of Stay: 3  CRITICAL CARE Performed by: [place] Nekia Maxham  Total critical care time: 14 minutes  -Critical care time was exclusive of separately billable procedures and treating other patients. -Critical care was necessary to treat or prevent imminent or life-threatening deterioration. -Critical care was time spent personally by me on the following activities: development of treatment plan with patient and/or surrogate as well as nursing, discussions with consultants, evaluation of patient's response to treatment, examination of patient, obtaining history from patient or surrogate, ordering and performing treatments and interventions, ordering and review of laboratory studies, ordering and review of radiographic studies, pulse oximetry and re-evaluation of patient's condition.  [place] Rileyann Florance, NP  07/01/2024, 7:26 AM  Advanced Heart Failure Team Pager [phone] (M-F; 7a - 5p)  Please contact [facility] for night-coverage after hours (5p -7a ) and weekends on [email or web address]

## 2024-07-01 NOTE — Progress Notes (Signed)
  Echocardiogram 2D Echocardiogram has been performed.  Andrew Brock 07/01/2024, 4:49 PM

## 2024-07-01 NOTE — TOC Initial Note (Signed)
 Transition of Care [facility]) - Initial/Assessment Note    Patient Details  Name: Andrew Brock MRN: [id number] Date of Birth: [birth year]  [facility]) CM/SW Contact:    Justina Delcia Czar, RN Phone Number: [phone] 07/01/2024, 2:46 PM  Clinical Narrative:                  Contacted pt's brother, and states pt was living home alone. Pt works full-time at [employer]. Brother will check with his job to see if FMLA needed.   Will continue to follow for dc needs.    Expected Discharge Plan: Skilled Nursing Facility Barriers to Discharge: Continued Medical Work up   Patient Goals and CMS Choice            Expected Discharge Plan and Services                                              Prior Living Arrangements/Services                       Activities of Daily Living      Permission Sought/Granted                  Emotional Assessment   Attitude/Demeanor/Rapport: Intubated (Following Commands or Not Following Commands)          Admission diagnosis:  ST elevation myocardial infarction (STEMI), unspecified artery (HCC) [I21.3] Acute ST elevation myocardial infarction (STEMI) due to occlusion of distal portion of left anterior descending (LAD) coronary artery (HCC) [I21.02] Patient Active Problem List   Diagnosis Date Noted   Acute ST elevation myocardial infarction (STEMI) due to occlusion of distal portion of left anterior descending (LAD) coronary artery (HCC) 06/28/2024   PCP:  Clinic, Bonni Lien Pharmacy:   [id number] [place], [place] - [address] AT [facility] [address] [place] [address] Phone: [phone] Fax: [phone]  [id number] - [place], [place] - [address] AT [facility] [place] [address] Phone: [phone] Fax: [phone]     Social Drivers of Health (SDOH) Social  History: SDOH Screenings   Food Insecurity: Patient Unable To Answer (06/29/2024)  Housing: Patient Unable To Answer (06/29/2024)  Transportation Needs: Patient Unable To Answer (06/29/2024)  Utilities: Patient Unable To Answer (06/29/2024)  Social Connections: Unknown (04/25/2022)   Received from [facility]  Tobacco Use: Low Risk  (11/10/2023)   SDOH Interventions:     Readmission Risk Interventions     No data to display

## 2024-07-01 NOTE — Progress Notes (Signed)
 Echocardiogram 2D Echocardiogram has been performed.  Damien FALCON Moniqua Engebretsen RDCS 07/01/2024, 8:10 AM  Notified Dr JAYSON

## 2024-07-02 ENCOUNTER — Inpatient Hospital Stay (HOSPITAL_COMMUNITY)

## 2024-07-02 ENCOUNTER — Telehealth (HOSPITAL_COMMUNITY): Payer: Self-pay | Admitting: Pharmacy Technician

## 2024-07-02 ENCOUNTER — Other Ambulatory Visit (HOSPITAL_COMMUNITY): Payer: Self-pay

## 2024-07-02 DIAGNOSIS — J9601 Acute respiratory failure with hypoxia: Secondary | ICD-10-CM | POA: Diagnosis not present

## 2024-07-02 DIAGNOSIS — I5023 Acute on chronic systolic (congestive) heart failure: Secondary | ICD-10-CM | POA: Diagnosis not present

## 2024-07-02 DIAGNOSIS — I2102 ST elevation (STEMI) myocardial infarction involving left anterior descending coronary artery: Secondary | ICD-10-CM | POA: Diagnosis not present

## 2024-07-02 DIAGNOSIS — J9602 Acute respiratory failure with hypercapnia: Secondary | ICD-10-CM | POA: Diagnosis not present

## 2024-07-02 LAB — GLUCOSE, CAPILLARY
Glucose-Capillary: 162 mg/dL — ABNORMAL HIGH (ref 70–99)
Glucose-Capillary: 163 mg/dL — ABNORMAL HIGH (ref 70–99)
Glucose-Capillary: 173 mg/dL — ABNORMAL HIGH (ref 70–99)
Glucose-Capillary: 141 mg/dL — ABNORMAL HIGH (ref 70–99)
Glucose-Capillary: 144 mg/dL — ABNORMAL HIGH (ref 70–99)
Glucose-Capillary: 142 mg/dL — ABNORMAL HIGH (ref 70–99)

## 2024-07-02 LAB — COOXEMETRY PANEL
Carboxyhemoglobin: 1.8 % — ABNORMAL HIGH (ref 0.5–1.5)
Methemoglobin: 0.9 % (ref 0.0–1.5)
O2 Saturation: 65.1 %
Total hemoglobin: 12.4 g/dL (ref 12.0–16.0)

## 2024-07-02 LAB — CBC
HCT: 29.8 % — ABNORMAL LOW (ref 39.0–52.0)
Hemoglobin: 9.8 g/dL — ABNORMAL LOW (ref 13.0–17.0)
MCH: 29.7 pg (ref 26.0–34.0)
MCHC: 32.9 g/dL (ref 30.0–36.0)
MCV: 90.3 fL (ref 80.0–100.0)
Platelets: 211 K/uL (ref 150–400)
RBC: 3.3 MIL/uL — ABNORMAL LOW (ref 4.22–5.81)
RDW: 14.8 % (ref 11.5–15.5)
WBC: 14.5 K/uL — ABNORMAL HIGH (ref 4.0–10.5)
nRBC: 0.3 % — ABNORMAL HIGH (ref 0.0–0.2)

## 2024-07-02 LAB — BASIC METABOLIC PANEL WITH GFR
Anion gap: 9 (ref 5–15)
BUN: 42 mg/dL — ABNORMAL HIGH (ref 6–20)
CO2: 24 mmol/L (ref 22–32)
Calcium: 8.3 mg/dL — ABNORMAL LOW (ref 8.9–10.3)
Chloride: 103 mmol/L (ref 98–111)
Creatinine, Ser: 1.71 mg/dL — ABNORMAL HIGH (ref 0.61–1.24)
GFR, Estimated: 45 mL/min — ABNORMAL LOW (ref >60)
Glucose, Bld: 175 mg/dL — ABNORMAL HIGH (ref 70–99)
Potassium: 3.7 mmol/L (ref 3.5–5.1)
Sodium: 136 mmol/L (ref 135–145)

## 2024-07-02 LAB — LIPOPROTEIN A (LPA): Lipoprotein (a): 197.6 nmol/L — ABNORMAL HIGH (ref <75.0)

## 2024-07-02 LAB — PHOSPHORUS: Phosphorus: 3.7 mg/dL (ref 2.5–4.6)

## 2024-07-02 LAB — TRIGLYCERIDES: Triglycerides: 101 mg/dL (ref <150)

## 2024-07-02 LAB — MAGNESIUM: Magnesium: 2.2 mg/dL (ref 1.7–2.4)

## 2024-07-02 MED ORDER — ORAL CARE MOUTH RINSE
15.0000 mL | OROMUCOSAL | Status: DC | PRN
Start: 2024-07-02 — End: 2024-07-04

## 2024-07-02 MED ORDER — LACTULOSE 10 GM/15ML PO SOLN: 20.0000 g | Freq: Once | ORAL | Status: DC

## 2024-07-02 MED ORDER — INSULIN ASPART 100 UNIT/ML IJ SOLN
0.0000 [IU] | Freq: Three times a day (TID) | INTRAMUSCULAR | Status: DC
  Administered 2024-07-02: 3 [IU] via SUBCUTANEOUS
  Administered 2024-07-03: 7 [IU] via SUBCUTANEOUS
  Administered 2024-07-03: 4 [IU] via SUBCUTANEOUS
  Administered 2024-07-03 – 2024-07-04 (×2): 3 [IU] via SUBCUTANEOUS
  Administered 2024-07-04 – 2024-07-05 (×2): 4 [IU] via SUBCUTANEOUS
  Administered 2024-07-05 – 2024-07-06 (×4): 3 [IU] via SUBCUTANEOUS
  Administered 2024-07-07 (×2): 4 [IU] via SUBCUTANEOUS
  Administered 2024-07-08: 3 [IU] via SUBCUTANEOUS

## 2024-07-02 MED ORDER — POTASSIUM CHLORIDE 20 MEQ PO PACK
40.0000 meq | PACK | Freq: Once | ORAL | Status: AC
  Administered 2024-07-02: 40 meq
  Filled 2024-07-02: qty 2

## 2024-07-02 MED ORDER — COLCHICINE 0.6 MG PO TABS
0.6000 mg | ORAL_TABLET | Freq: Every day | ORAL | Status: DC
  Administered 2024-07-03 – 2024-07-08 (×6): 0.6 mg via ORAL
  Filled 2024-07-02 (×6): qty 1

## 2024-07-02 MED ORDER — LACTULOSE 10 GM/15ML PO SOLN
20.0000 g | ORAL | Status: DC
  Administered 2024-07-02 (×2): 20 g
  Filled 2024-07-02 (×2): qty 30

## 2024-07-02 MED ORDER — ATORVASTATIN CALCIUM 80 MG PO TABS
80.0000 mg | ORAL_TABLET | Freq: Every day | ORAL | Status: DC
  Administered 2024-07-03 – 2024-07-08 (×6): 80 mg via ORAL
  Filled 2024-07-02 (×6): qty 1

## 2024-07-02 MED ORDER — LACTULOSE 10 GM/15ML PO SOLN
20.0000 g | ORAL | Status: AC
  Administered 2024-07-02: 20 g via ORAL
  Filled 2024-07-02: qty 30

## 2024-07-02 MED ORDER — ACETAMINOPHEN 325 MG PO TABS
650.0000 mg | ORAL_TABLET | ORAL | Status: DC | PRN
  Administered 2024-07-03 – 2024-07-07 (×7): 650 mg via ORAL
  Filled 2024-07-02 (×8): qty 2

## 2024-07-02 MED ORDER — SENNA 8.6 MG PO TABS
2.0000 | ORAL_TABLET | Freq: Every day | ORAL | Status: DC
  Administered 2024-07-02 – 2024-07-05 (×3): 17.2 mg via ORAL
  Filled 2024-07-02 (×4): qty 2

## 2024-07-02 MED ORDER — INSULIN ASPART 100 UNIT/ML IJ SOLN: 0.0000 [IU] | Freq: Every day | INTRAMUSCULAR | Status: DC

## 2024-07-02 MED ORDER — ASPIRIN 81 MG PO CHEW
81.0000 mg | CHEWABLE_TABLET | Freq: Every day | ORAL | Status: DC
  Administered 2024-07-03 – 2024-07-08 (×6): 81 mg via ORAL
  Filled 2024-07-02 (×6): qty 1

## 2024-07-02 MED ORDER — ACETAZOLAMIDE 250 MG PO TABS
500.0000 mg | ORAL_TABLET | Freq: Once | ORAL | Status: AC
  Administered 2024-07-02: 500 mg via ORAL
  Filled 2024-07-02: qty 2

## 2024-07-02 MED ORDER — POTASSIUM CHLORIDE CRYS ER 20 MEQ PO TBCR
40.0000 meq | EXTENDED_RELEASE_TABLET | Freq: Once | ORAL | Status: AC
  Administered 2024-07-02: 40 meq via ORAL
  Filled 2024-07-02: qty 2

## 2024-07-02 MED ORDER — POLYETHYLENE GLYCOL 3350 17 G PO PACK
17.0000 g | PACK | Freq: Two times a day (BID) | ORAL | Status: DC
  Administered 2024-07-05 – 2024-07-06 (×2): 17 g via ORAL
  Filled 2024-07-02 (×5): qty 1

## 2024-07-02 MED FILL — Protamine Sulfate Inj 10 MG/ML: INTRAVENOUS | Qty: 5 | Status: AC

## 2024-07-02 NOTE — Telephone Encounter (Signed)
 Pharmacy Patient Advocate Encounter  Insurance verification completed.    The patient is insured through [employer]. Patient has [employer], may use a copay card, and/or apply for patient assistance if available.    Ran test claim for Colchicine  0.6mg  tablets and the current 30 day co-pay is $36.48.Colchicine  6mg  capsules requires a prior authorization.   This test claim was processed through [facility]- copay amounts may vary at other pharmacies due to pharmacy/plan contracts, or as the patient moves through the different stages of their insurance plan.

## 2024-07-02 NOTE — Plan of Care (Signed)
  Problem: Education: Goal: Knowledge of General Education information will improve Description: Including pain rating scale, medication(s)/side effects and non-pharmacologic comfort measures Outcome: Progressing   Problem: Nutrition: Goal: Adequate nutrition will be maintained Outcome: Progressing   Problem: Elimination: Goal: Will not experience complications related to urinary retention Outcome: Progressing   Problem: Pain Managment: Goal: General experience of comfort will improve and/or be controlled Outcome: Progressing   Problem: Coping: Goal: Level of anxiety will decrease Outcome: Not Progressing   Problem: Elimination: Goal: Will not experience complications related to bowel motility Outcome: Not Progressing

## 2024-07-02 NOTE — Procedures (Signed)
 Extubation Procedure Note  Patient Details:   Name: Andrew Brock DOB: 1961/07/08 MRN: [id number]   Airway Documentation:    Vent end date: 07/02/24 Vent end time: 1025   Evaluation  O2 sats: stable throughout Complications: No apparent complications Patient did tolerate procedure well. Bilateral Breath Sounds: Clear   Yes  Patient extubated per order to 4L [place] with no apparent complications. Positive cuff leak was noted prior to extubation. Patient is alert, has strong cough, and is able to speak. Vitals are stable.   [name] 07/02/2024, 10:28 AM

## 2024-07-02 NOTE — Progress Notes (Signed)
 NAME:  Andrew Brock, MRN:  [id number], DOB:  28-May-1961, LOS: 4 ADMISSION DATE:  06/28/2024, CONSULTATION DATE:  06/28/2024 REFERRING MD: Zenaida, MD, CHIEF COMPLAINT: resp failure   History of Present Illness:  A 63 yr old male patient with DM-2 (HbA1c 7.8%), morbid obesity, NHL (2005), and dyslipidemia, who presented with severe CP around 7PM. EKG shows anterolateral ST elevations so the patient was taken to the cath lab. During the case, his cat showed an occluded LAD distally. During the attempt to open the vessel he developed a small perforation in the distal vessel. Echo was obtained, no pericardial effusion, and a 2.0 balloon was inflated. Following this, he became progressively hypertensive, began trying to sit up, and was intubated for respiratory distress. RHC showed RA 25, PA 70/40, and PCWP 35, though cardiac output preserved by thermodilution. ABG showed significant resp acidosis, ventilator settings were changed improve minute ventilation. Given Nipride  with improvement in PA pressures. Given Lasix  and UOP is 650 cc since then. In the ICU, he has epistaxis, and nares were packed with gauze. On Dobutamine  5 mcg/kg/min, Levophed  10 mcg/min, Propofol  20 mcg/kg/min, Versed  10 mg/hr, and Fentanyl  200 mcg/hr.  On PRVC 24/600/+5/100%. Was difficult to sedate in cath, so given Versed  high dose. No hx of smoking.   Pertinent  Medical History  DM-2, morbid obesity, NHL (2005), dyslipidemia, chronic LBP  Significant Hospital Events: Including procedures, antibiotic start and stop dates in addition to other pertinent events   7/18: admitted to ICU after LHC/RHC, intubated 7/19 remained intubated and sedated, urine output is decreasing with rising serum creatinine 7/20 decreased urine output, serum creatinine started trending up, FiO2 titrated down to 40% PEEP at 8 7/22 patient weaning from vent currently on Amio, and dobutamine    Interim History / Subjective:  Patient currently tolerating  spontaneous breathing trial, weaning off vent-hopefully able to extubate later today Patient following commands, on amio-in normal sinus rhythm currently  Objective    Blood pressure (!) 125/113, pulse 83, temperature 98.1 F (36.7 C), temperature source Core, resp. rate (!) 24, height 5' 11 (1.803 m), weight (!) 149.9 kg, SpO2 94%. PAP: (23-42)/(17-38) 25/21 CVP:  [9 mmHg-19 mmHg] 12 mmHg CO:  [5.4 L/min-6.9 L/min] 6.3 L/min CI:  [2.1 L/min/m2-2.68 L/min/m2] 2.46 L/min/m2  Vent Mode: PRVC FiO2 (%):  [30 %-40 %] 30 % Set Rate:  [24 bmp] 24 bmp Vt Set:  [600 mL] 600 mL PEEP:  [8 cmH20] 8 cmH20 Pressure Support:  [8 cmH20] 8 cmH20 Plateau Pressure:  [26 cmH20] 26 cmH20   Intake/Output Summary (Last 24 hours) at 07/02/2024 9167 Last data filed at 07/02/2024 0700 Gross per 24 hour  Intake 1752.52 ml  Output 3785 ml  Net -2032.48 ml   Filed Weights   06/30/24 0434 07/01/24 0315 07/02/24 0500  Weight: (!) 151.3 kg (!) 149.7 kg (!) 149.9 kg    Examination: General: acutely ill on chronic older male lying in icu bed on vent NAD HEENT: Normocephalic, PERRLA intact-2 mm B/L,-scleral bleeding, ETT, OG, Pink MM CV: s1,s2, RRR, no MRG, No JVD, pericardial drain in place-W/minimal serosanguineous output pulm: clear, diminished, no distress on vent  Abs: bs active, soft  Extremities: 1+ pitting edema edema, no deformity, moves all extremities on command,  Skin: no rash  Neuro: Rass 0, follows commands, no focal deficits GU: foley intact    Patient Lines/Drains/Airways Status     Active Line/Drains/Airways     Name Placement date Placement time Site Days   Arterial  Line 06/28/24 Left Radial 06/28/24  1955  Radial  3   Peripheral IV 06/28/24 20 G Left Antecubital 06/28/24  1719  Antecubital  3   Sheath 06/28/24 Right Femoral;Venous 06/28/24  1920  Femoral;Venous  3   NG/OG Vented/Dual Lumen Oral External length of tube 43 cm 06/29/24  --  Oral  2   Urethral Catheter hadley rn 16 Fr.  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 with cardiogenic shock LHC complicated with LAD perforation status post balloon tamponade, small pericardial effusion 7/21 echo-EF 20 to 25%, LV function severely reduced/RV moderately reduced, pericardial effusion circumferential Last Coox on 7/21 56.9  P Continue aspirin /statin Advanced heart failure team continues to follow, appreciate assistance Continues to remain on dobutamine , repeat coox Currently remains off vasopressor support  Paroxysmal A-fib with RVR Currently in normal sinus rhythm, on Amio P: Continue amiodarone  infusion for now Continue monitoring on continuous cardiac telemetry Remains off anticoagulation in the setting of hemopericardium  Acute respiratory failure with hypoxia and hypercapnia due to acute pulmonary edema Probable aspiration pneumonia Leukocytosis Procalcitonin 7/20-3.64 P:  Continue ventilator support and lung protective strategies  Continue LTVV  Wean PEEP and Fio2 requirements to sat goal of >92%  HOB > 30 degrees Plat < 30  Aim for Driving pressures < 15  Intermittent Chest X-ray and ABGS Obtain and follow cultures-blood and tracheal aspirate VAP and PAD protocols in place  Wean sedation as tolerated, SBT and WUA daily-currently off propofol /fentanyl  - Currently performing SBT Pending CBC, follow-up with Continue IV Zosyn  for now  Acute metabolic encephalopathy due to hypoxia and hypercapnia Improving P: Continue to wean off sedation, minimize sedating medications as tolerated Initiate delirium precautions especially once extubated  Poorly controlled DM-2 (HbA1c 7.8%), complicated with vasculopathy CBG trend aligning  with goal P: Continue CBGs every 4 Continue Lantus  20 units twice daily Once extubated will need diabetes coordinator consult to continue follow-up  Acute kidney injury due to ischemic ATN from shock Serum creatinine started to come down-creatinine 1.71 on 7/22 Last 24 hours-patient's urine output 3.4 L, now net -189 since admission P: Continue to trend renal function daily  Continue to monitor and optimize electrolytes daily Continue to monitor urine output Continue strict I/Os Continue Adequate renal perfusion  Avoid nephrotoxic agents  Continue diuresis as tolerated  Shock liver LFTs are improving P: Continue to avoid hepatotoxic agents Repeat LFTs intermittently will repeat in a.m. 7/23  Morbid obesity P: Will need diet/exercise counseling as appropriate  Cloudy urine Acute UTI was ruled out P: Continue to trend urine output Once extubated, will hopefully remove Foley catheter  Best Practice (right click and Reselect all SmartList Selections daily)   Diet/type: Tube feeds DVT prophylaxis SCD Pressure ulcer(s): N/A GI prophylaxis: H2B Lines: Central line and Arterial Line Foley:  Yes, and it is still needed Code Status:  full code Last date of multidisciplinary goals of care discussion [Per primary team] Sherlean Sharps AGACNP-BC   [facility] 07/02/2024, 9:15 AM  Please see [email or web address] for pager details.  From 7A-7P if no response, please call [phone]. After hours, please call ELink [phone].

## 2024-07-02 NOTE — Plan of Care (Signed)
  Problem: Education: Goal: Knowledge of General Education information will improve Description: Including pain rating scale, medication(s)/side effects and non-pharmacologic comfort measures Outcome: Progressing   Problem: Health Behavior/Discharge Planning: Goal: Ability to manage health-related needs will improve Outcome: Progressing   Problem: Clinical Measurements: Goal: Ability to maintain clinical measurements within normal limits will improve Outcome: Progressing Goal: Will remain free from infection Outcome: Progressing Goal: Diagnostic test results will improve Outcome: Progressing Goal: Respiratory complications will improve Outcome: Progressing   Problem: Activity: Goal: Risk for activity intolerance will decrease Outcome: Progressing   Problem: Elimination: Goal: Will not experience complications related to bowel motility Outcome: Progressing   Problem: Safety: Goal: Ability to remain free from injury will improve Outcome: Progressing

## 2024-07-02 NOTE — Progress Notes (Signed)
 Advanced Heart Failure Rounding Note  Cardiologist: None  AHF Consulting MD: Dr. Zenaida Chief Complaint: Cardiogenic shock Patient Profile   63 y.o. male with a PMH of non-Hodgkin's lymphoma who presents with anterior STEMI complicated by distal LAD perforation. Now with CGS.  Subjective:    7/18: LHC c/b LAD perforation>CGS 7/20: in AF RVR, converted to NSR on IV amio 7/21: worsening hemodynamics s/p pericardiocentesis w drain placement (~500cc total out)  Coox pending. CVP 13. On DBA 5. CO/CI improved with pericardiocentesis. Although PAPi remains low (accuracy questionable as line is difficult to flush and dampened). 3.4L UOP (net -1.9L) Remains in NSR on IV amio Plan to extubate today per CCM.   Swan: (PAP dampened, line not drawing, question accuracy) PAP: (23-42)/(17-38) 25/21 CVP:  [9 mmHg-19 mmHg] 12 mmHg CO:  [5.4 L/min-6.9 L/min] 6.3 L/min CI:  [2.1 L/min/m2-2.68 L/min/m2] 2.46 L/min/m2  Awake, following commands. Intubated. On PS.   Objective:    Weight Range: (!) 149.9 kg Body mass index is 46.09 kg/m.   Vital Signs:   Temp:  [97.2 F (36.2 C)-99.5 F (37.5 C)] 98.1 F (36.7 C) (07/22 0400) Pulse Rate:  [70-87] 83 (07/22 0715) Resp:  [12-30] 24 (07/22 0715) SpO2:  [91 %-100 %] 94 % (07/22 0715) Arterial Line BP: (114-156)/(54-86) 123/64 (07/22 0715) FiO2 (%):  [30 %-40 %] 30 % (07/22 0831) Weight:  [149.9 kg] 149.9 kg (07/22 0500) Last BM Date :  (PTA)  Weight change: Filed Weights   06/30/24 0434 07/01/24 0315 07/02/24 0500  Weight: (!) 151.3 kg (!) 149.7 kg (!) 149.9 kg   Intake/Output:  Intake/Output Summary (Last 24 hours) at 07/02/2024 0916 Last data filed at 07/02/2024 0800 Gross per 24 hour  Intake 1531.96 ml  Output 3885 ml  Net -2353.04 ml    Physical Exam    CVP 13 General: Critically-ill appearing. No distress on vent Cardiac: Thick neck. S1 and S2 present.  Abdomen: Soft, distended.  Extremities: Warm and dry. Trace  generalized edema.  Neuro: Awake, comfortable on vent  Telemetry   NSR in 80s (personally reviewed)  Medications:    Scheduled Medications:  aspirin   81 mg Per Tube Daily   atorvastatin   80 mg Per Tube Daily   Chlorhexidine  Gluconate Cloth  6 each Topical Daily   colchicine   0.6 mg Per Tube Daily   feeding supplement (PROSource TF20)  60 mL Per Tube Daily   feeding supplement (VITAL HIGH PROTEIN)  1,000 mL Per Tube Q24H   furosemide   80 mg Intravenous BID   insulin  aspart  0-20 Units Subcutaneous Q4H   insulin  aspart  4 Units Subcutaneous Q4H   insulin  glargine-yfgn  20 Units Subcutaneous BID   lactulose   20 g Per Tube Q4H   mouth rinse  15 mL Mouth Rinse Q2H   polyethylene glycol  17 g Per Tube BID   potassium chloride   40 mEq Per Tube Once   senna  2 tablet Per Tube QHS   sodium chloride  flush  3 mL Intravenous Q12H    Infusions:  amiodarone  30 mg/hr (07/02/24 0500)   DOBUTamine  5 mcg/kg/min (07/02/24 0500)   famotidine  (PEPCID ) IV Stopped (07/01/24 2337)   fentaNYL  infusion INTRAVENOUS 250 mcg/hr (07/02/24 0500)   piperacillin -tazobactam (ZOSYN )  IV 3.375 g (07/02/24 0559)   propofol  (DIPRIVAN ) infusion 15 mcg/kg/min (07/02/24 0500)    PRN Medications: acetaminophen , fentaNYL , midazolam , ondansetron  (ZOFRAN ) IV, mouth rinse, sodium chloride  flush  Assessment/Plan   Acute HFrEF with Cardiogenic shock:  Biventricular, EF 20-25%, with mildly reduced RV. Suspect long standing given elevated PA pressures, cardiomyopathy out of proportion to ischemic disease. LA normal, however reduced CO/CI on PAC. S/P pericardiocentesis with drain placement with improvement of CO/CI. - CO/CI 6.9/2.67. Coox pending. PAPi remains low, however question reliability of PAP waveform.  - Continue DBA 5 mcg/kg/min until diuresed - CVP 13. Diuresing well on IV Lasix . CXR today with pulmonary edema. Continue 80 mg IV bid today - Does not appear to have significant social support at home and given  obesity is not a heart transplant candidate, marginal LVAD candidate given RV failure  CAD with anterior STEMI: Distal LAD disease, potentially embolic with clear cut off sign. Case complicated by distal perforation. - Continue aspirin . Hold P2Y12 with perforation and growing pericardial effusion - Consider anticoagulation once he is stable from an effusion standpoint, suspect this is embolic  Pericardial effusion: Traumatic with recent perforation.  - Mild worsening on echo 7/20 some slight TV variation but no other evidence of tamponade - Echo 7/21 with increased circumferential effusion without signs of tamponade on echo, however with worsening hemodynamics - S/P pericardiocentesis with drain placement with improvement of CO/CI (500 cc total sanguineous output) - 15cc out overnight. hgb 11.9>10.7>9.8. CTM  Acute hypoxic respiratory failure:  - Due to pulmonary edema, hypercarbia improved - intubated; plan to extubated today per CCM, on pressure support - concern for possible aspiration PNA with leukocytosis and procal 3 - started on zosyn   AKI: Due to shock and cardiorenal syndrome.  - Cr slightly down 1.8>1.7 - Diuresing well; continue as above  Paroxysmal Atrial Fibrillation: - new with RVR. - converted to NSR on IV amio; continue while on DBA - remains in NSR - no AC with LAD perforation  Length of Stay: 4  CRITICAL CARE Performed by: [place] Tiarrah Saville  Total critical care time: 14 minutes  -Critical care time was exclusive of separately billable procedures and treating other patients. -Critical care was necessary to treat or prevent imminent or life-threatening deterioration. -Critical care was time spent personally by me on the following activities: development of treatment plan with patient and/or surrogate as well as nursing, discussions with consultants, evaluation of patient's response to treatment, examination of patient, obtaining history from patient or surrogate,  ordering and performing treatments and interventions, ordering and review of laboratory studies, ordering and review of radiographic studies, pulse oximetry and re-evaluation of patient's condition.  [place] Cleavon Goldman, NP  07/02/2024, 9:16 AM  Advanced Heart Failure Team Pager [phone] (M-F; 7a - 5p)  Please contact [facility] for night-coverage after hours (5p -7a ) and weekends on [email or web address]

## 2024-07-03 ENCOUNTER — Inpatient Hospital Stay (HOSPITAL_COMMUNITY)

## 2024-07-03 DIAGNOSIS — I3139 Other pericardial effusion (noninflammatory): Secondary | ICD-10-CM

## 2024-07-03 DIAGNOSIS — J9601 Acute respiratory failure with hypoxia: Secondary | ICD-10-CM | POA: Diagnosis not present

## 2024-07-03 DIAGNOSIS — I2102 ST elevation (STEMI) myocardial infarction involving left anterior descending coronary artery: Secondary | ICD-10-CM | POA: Diagnosis not present

## 2024-07-03 DIAGNOSIS — J9602 Acute respiratory failure with hypercapnia: Secondary | ICD-10-CM | POA: Diagnosis not present

## 2024-07-03 DIAGNOSIS — I5023 Acute on chronic systolic (congestive) heart failure: Secondary | ICD-10-CM | POA: Diagnosis not present

## 2024-07-03 DIAGNOSIS — D72829 Elevated white blood cell count, unspecified: Secondary | ICD-10-CM

## 2024-07-03 DIAGNOSIS — I480 Paroxysmal atrial fibrillation: Secondary | ICD-10-CM

## 2024-07-03 LAB — BASIC METABOLIC PANEL WITH GFR
Anion gap: 11 (ref 5–15)
Anion gap: 9 (ref 5–15)
BUN: 37 mg/dL — ABNORMAL HIGH (ref 6–20)
BUN: 33 mg/dL — ABNORMAL HIGH (ref 6–20)
CO2: 28 mmol/L (ref 22–32)
CO2: 28 mmol/L (ref 22–32)
Calcium: 8.3 mg/dL — ABNORMAL LOW (ref 8.9–10.3)
Calcium: 8.4 mg/dL — ABNORMAL LOW (ref 8.9–10.3)
Chloride: 102 mmol/L (ref 98–111)
Chloride: 103 mmol/L (ref 98–111)
Creatinine, Ser: 1.57 mg/dL — ABNORMAL HIGH (ref 0.61–1.24)
Creatinine, Ser: 1.57 mg/dL — ABNORMAL HIGH (ref 0.61–1.24)
GFR, Estimated: 50 mL/min — ABNORMAL LOW (ref >60)
GFR, Estimated: 50 mL/min — ABNORMAL LOW (ref >60)
Glucose, Bld: 154 mg/dL — ABNORMAL HIGH (ref 70–99)
Glucose, Bld: 201 mg/dL — ABNORMAL HIGH (ref 70–99)
Potassium: 3.5 mmol/L (ref 3.5–5.1)
Potassium: 4.2 mmol/L (ref 3.5–5.1)
Sodium: 141 mmol/L (ref 135–145)
Sodium: 140 mmol/L (ref 135–145)

## 2024-07-03 LAB — PHOSPHORUS: Phosphorus: 4.7 mg/dL — ABNORMAL HIGH (ref 2.5–4.6)

## 2024-07-03 LAB — ECHOCARDIOGRAM LIMITED
Height: 71 in
S' Lateral: 5.14 cm
Weight: 5093.51 [oz_av]

## 2024-07-03 LAB — GLUCOSE, CAPILLARY
Glucose-Capillary: 134 mg/dL — ABNORMAL HIGH (ref 70–99)
Glucose-Capillary: 166 mg/dL — ABNORMAL HIGH (ref 70–99)
Glucose-Capillary: 203 mg/dL — ABNORMAL HIGH (ref 70–99)
Glucose-Capillary: 155 mg/dL — ABNORMAL HIGH (ref 70–99)

## 2024-07-03 LAB — CBC
HCT: 30.5 % — ABNORMAL LOW (ref 39.0–52.0)
Hemoglobin: 9.8 g/dL — ABNORMAL LOW (ref 13.0–17.0)
MCH: 29.7 pg (ref 26.0–34.0)
MCHC: 32.1 g/dL (ref 30.0–36.0)
MCV: 92.4 fL (ref 80.0–100.0)
Platelets: 206 K/uL (ref 150–400)
RBC: 3.3 MIL/uL — ABNORMAL LOW (ref 4.22–5.81)
RDW: 14.9 % (ref 11.5–15.5)
WBC: 14.5 K/uL — ABNORMAL HIGH (ref 4.0–10.5)
nRBC: 0.3 % — ABNORMAL HIGH (ref 0.0–0.2)

## 2024-07-03 LAB — COOXEMETRY PANEL
Carboxyhemoglobin: 1.1 % (ref 0.5–1.5)
Methemoglobin: <0.7 % (ref 0.0–1.5)
O2 Saturation: 59 %
Total hemoglobin: 10.7 g/dL — ABNORMAL LOW (ref 12.0–16.0)

## 2024-07-03 LAB — MAGNESIUM
Magnesium: 2.3 mg/dL (ref 1.7–2.4)
Magnesium: 2.3 mg/dL (ref 1.7–2.4)

## 2024-07-03 MED ORDER — OXYCODONE HCL 5 MG PO TABS
5.0000 mg | ORAL_TABLET | Freq: Four times a day (QID) | ORAL | Status: DC | PRN
  Administered 2024-07-03 – 2024-07-08 (×10): 5 mg via ORAL
  Filled 2024-07-03 (×10): qty 1

## 2024-07-03 MED ORDER — POTASSIUM CHLORIDE CRYS ER 20 MEQ PO TBCR
40.0000 meq | EXTENDED_RELEASE_TABLET | Freq: Once | ORAL | Status: AC
  Administered 2024-07-03: 40 meq via ORAL
  Filled 2024-07-03: qty 2

## 2024-07-03 MED ORDER — SPIRONOLACTONE 25 MG PO TABS
25.0000 mg | ORAL_TABLET | Freq: Every day | ORAL | Status: DC
  Administered 2024-07-03 – 2024-07-08 (×6): 25 mg via ORAL
  Filled 2024-07-03 (×6): qty 1

## 2024-07-03 MED ORDER — AMIODARONE IV BOLUS ONLY 150 MG/100ML
150.0000 mg | Freq: Once | INTRAVENOUS | Status: AC
  Administered 2024-07-03: 150 mg via INTRAVENOUS
  Filled 2024-07-03: qty 100

## 2024-07-03 MED ORDER — FUROSEMIDE 10 MG/ML IJ SOLN
80.0000 mg | Freq: Two times a day (BID) | INTRAMUSCULAR | Status: AC
  Administered 2024-07-03: 80 mg via INTRAVENOUS
  Filled 2024-07-03: qty 8

## 2024-07-03 NOTE — Plan of Care (Signed)
  Problem: Education: Goal: Knowledge of General Education information will improve Description: Including pain rating scale, medication(s)/side effects and non-pharmacologic comfort measures Outcome: Progressing   Problem: Clinical Measurements: Goal: Respiratory complications will improve Outcome: Progressing Goal: Cardiovascular complication will be avoided Outcome: Progressing   Problem: Activity: Goal: Risk for activity intolerance will decrease Outcome: Progressing   Problem: Coping: Goal: Level of anxiety will decrease Outcome: Progressing   Problem: Elimination: Goal: Will not experience complications related to bowel motility Outcome: Progressing Goal: Will not experience complications related to urinary retention Outcome: Progressing

## 2024-07-03 NOTE — Progress Notes (Signed)
 Advanced Heart Failure Rounding Note  Cardiologist: None  AHF Consulting MD: Dr. Zenaida Chief Complaint: Cardiogenic shock Patient Profile   63 y.o. male with a PMH of non-Hodgkin's lymphoma who presents with anterior STEMI complicated by distal LAD perforation. Now with CGS.  Subjective:    7/18: LHC c/b LAD perforation>CGS 7/20: in AF RVR, converted to NSR on IV amio 7/21: worsening hemodynamics s/p pericardiocentesis w drain placement (~500cc total out) 7/22: Extubated.   Coox 59%. CVP 10. On DBA 5.  4.7L UOP (net -2.5L). 25cc from pericardial drain in 24H. sCr 1.7>1.5  Swan:  PAP: (28-338)/(17-303) 40/20 CVP:  [7 mmHg-33 mmHg] 7 mmHg CO:  [6.9 L/min] 6.9 L/min CI:  [2.66 L/min/m2-2.67 L/min/m2] 2.66 L/min/m2 PAPi 1.5 SVR 765  Sitting up in bed. Comfortable. No SOB. No CP.   Objective:    Weight Range: (!) 144.4 kg Body mass index is 44.4 kg/m.   Vital Signs:   Temp:  [93 F (33.9 C)-99.9 F (37.7 C)] 98.8 F (37.1 C) (07/23 0400) Pulse Rate:  [74-95] 80 (07/23 0649) Resp:  [12-39] 23 (07/23 0649) BP: (116-122)/(72-84) 116/72 (07/23 0649) SpO2:  [88 %-98 %] 95 % (07/23 0649) Arterial Line BP: (90-157)/(57-81) 115/59 (07/23 0649) FiO2 (%):  [30 %] 30 % (07/22 0831) Weight:  [144.4 kg] 144.4 kg (07/23 0500) Last BM Date : 07/02/24  Weight change: Filed Weights   07/01/24 0315 07/02/24 0500 07/03/24 0500  Weight: (!) 149.7 kg (!) 149.9 kg (!) 144.4 kg   Intake/Output:  Intake/Output Summary (Last 24 hours) at 07/03/2024 0803 Last data filed at 07/03/2024 0600 Gross per 24 hour  Intake 2111.96 ml  Output 4595 ml  Net -2483.04 ml    Physical Exam    CVP 10 General: Obese appearing.  Cardiac: S1 and S2 present. No murmurs. JVP difficult to assess Abdomen: Soft, non-tender, non-distended.  Extremities: Warm and dry.  Trace generalized edema.  Neuro: Alert and oriented x3. Affect pleasant. Moves all extremities without difficulty.  Telemetry    SR in 70s with 4PVCs/min (personally reviewed)  Medications:    Scheduled Medications:  aspirin   81 mg Oral Daily   atorvastatin   80 mg Oral Daily   Chlorhexidine  Gluconate Cloth  6 each Topical Daily   colchicine   0.6 mg Oral Daily   insulin  aspart  0-20 Units Subcutaneous TID WC   insulin  aspart  0-5 Units Subcutaneous QHS   insulin  glargine-yfgn  20 Units Subcutaneous BID   polyethylene glycol  17 g Oral BID   senna  2 tablet Oral QHS   sodium chloride  flush  3 mL Intravenous Q12H    Infusions:  amiodarone  30 mg/hr (07/03/24 0600)   DOBUTamine  5 mcg/kg/min (07/03/24 0600)   piperacillin -tazobactam (ZOSYN )  IV 12.5 mL/hr at 07/03/24 0600    PRN Medications: acetaminophen , ondansetron  (ZOFRAN ) IV, mouth rinse, sodium chloride  flush  Assessment/Plan   Acute HFrEF with Cardiogenic shock: Biventricular, EF 20-25%, with mildly reduced RV. Suspect long standing given elevated PA pressures, cardiomyopathy out of proportion to ischemic disease. LA normal, however reduced CO/CI on PAC. S/P pericardiocentesis with drain placement with improvement of CO/CI. - CO 59% (CBC pending). CO/CI 6.9/2.66. PAPi improving 1.5 today - Continue DBA 5 mcg/kg/min until diuresed - CVP 10. Diuresing well on IV Lasix . Give IV Lasix  80 mg IV - Start spiro 25 mg today to assist with K - Does not appear to have significant social support at home and given obesity is not a heart transplant  candidate, marginal LVAD candidate given RV failure  CAD with anterior STEMI: Distal LAD disease, potentially embolic with clear cut off sign. Case complicated by distal perforation. - Continue aspirin . Hold P2Y12 with perforation and growing pericardial effusion - Consider anticoagulation once he is stable from an effusion standpoint, suspect this is embolic  Pericardial effusion: Traumatic with recent perforation.  - Mild worsening on echo 7/20 some slight TV variation but no other evidence of tamponade - Echo 7/21  with increased circumferential effusion without signs of tamponade on echo, however with worsening hemodynamics - S/P pericardiocentesis with drain placement with improvement of CO/CI (500 cc total sanguineous output) - 25cc in last 24H. hgb 11.9>10.7>9.8>pending - Pull drain today  Acute hypoxic respiratory failure:  - Due to pulmonary edema - Extubated 7/22 - concern for possible aspiration PNA with leukocytosis and procal 3 - leukocytosis improving 18.7>14.5 - on Zosyn   AKI: Due to shock and cardiorenal syndrome.  - Cr improving with diuresis, 1.5 today - Diuresing well; continue as above  Paroxysmal Atrial Fibrillation: - new with RVR - converted to NSR on IV amio; continue while on DBA - remains in NSR - no AC with LAD perforation  Length of Stay: 5  CRITICAL CARE Performed by: [place] Lorelai Huyser  Total critical care time: 10 minutes  -Critical care time was exclusive of separately billable procedures and treating other patients. -Critical care was necessary to treat or prevent imminent or life-threatening deterioration. -Critical care was time spent personally by me on the following activities: development of treatment plan with patient and/or surrogate as well as nursing, discussions with consultants, evaluation of patient's response to treatment, examination of patient, obtaining history from patient or surrogate, ordering and performing treatments and interventions, ordering and review of laboratory studies, ordering and review of radiographic studies, pulse oximetry and re-evaluation of patient's condition.  [place] Shaelin Lalley, NP  07/03/2024, 8:03 AM  Advanced Heart Failure Team Pager [phone] (M-F; 7a - 5p)  Please contact [facility] for night-coverage after hours (5p -7a ) and weekends on [email or web address]

## 2024-07-03 NOTE — Inpatient Diabetes Management (Addendum)
 Inpatient Diabetes Program Recommendations  AACE/ADA: New Consensus Statement on Inpatient Glycemic Control   Target Ranges:  Prepandial:   less than 140 mg/dL      Peak postprandial:   less than 180 mg/dL (1-2 hours)      Critically ill patients:  140 - 180 mg/dL    Latest Reference Range & Units 07/02/24 07:40 07/02/24 11:51 07/02/24 16:38 07/02/24 19:45 07/02/24 22:09 07/03/24 06:05  Glucose-Capillary 70 - 99 mg/dL 836 (H) 826 (H) 858 (H) 144 (H) 142 (H) 134 (H)    Latest Reference Range & Units 06/28/24 17:46  Hemoglobin A1C 4.8 - 5.6 % 7.8 (H)   Review of Glycemic Control  Diabetes history: DM2 Outpatient Diabetes medications: Lantus  34 units daily, Metformin 1000 mg BID, Januvia 100 mg daily Current orders for Inpatient glycemic control: Semglee  20 units BID, Novolog  0-20 units TID with meals, Novolog  0-5 units QHS  Inpatient Diabetes Program Recommendations:    HbgA1C: A1C 7.8% on 06/28/24 indicating an average glucose of 177 mg/dl over the past 2-3 months.  Per Care Everywhere, last A1C was 8% on 05/28/24.  NOTE: Patient admitted on 06/28/24 with STEMI. In reviewing chart, noted in Care Everywhere that patient was seen at [facility] in [place] on 06/18/24 by Burnard Laos, Clinical Pharmacist Practitioner; noted patient uses FreeStyle Libre3 and was asked to continue Lantus  34 units daily, continue Metformin 1000 mg BID, and started on Januvia 100 mg daily.   Addendum 07/03/24@12 :25-Spoke with patient at bedside regarding DM medications and control. Patient confirms that he is taking Lantus  34 units daily and Metformin 1000 mg daily. He states that he thinks he has started the Januvia (that was prescribed on 06/18/24) but he is not positive.  Patient reports he uses the FreeStyle Libre 3 CGM and that his glucose runs in the low 200's mostly. Stressed importance of DM control especially in setting of STEMI. Discussed current insulin  regimen and explained that outpatient insulin  dosages  may need to be increased and asked that he pay attention to discharge instructions in case dosages are changed. Asked that if glucose is staying consistently over 180 mg/dl at home to reach out to PCP regarding glycemic trends. Patient states that he has all needed DM medications and supplies at home and he understands importance of tight DM control.   Thanks, Earnie Gainer, RN, MSN, CDCES Diabetes Coordinator Inpatient Diabetes Program [phone] (Team Pager from 8am to 5pm)

## 2024-07-03 NOTE — Progress Notes (Signed)
 NAME:  Andrew Brock, MRN:  [id number], DOB:  1961-07-19, LOS: 5 ADMISSION DATE:  06/28/2024, CONSULTATION DATE:  06/28/2024 REFERRING MD: Zenaida, MD, CHIEF COMPLAINT: resp failure   History of Present Illness:  A 63 yr old male patient with DM-2 (HbA1c 7.8%), morbid obesity, NHL (2005), and dyslipidemia, who presented with severe CP around 7PM. EKG shows anterolateral ST elevations so the patient was taken to the cath lab. During the case, his cath showed an occluded LAD distally. During the attempt to open the vessel he developed a small perforation in the distal vessel. Echo was obtained, no pericardial effusion, and a 2.0 balloon was inflated. Following this, he became progressively hypertensive, began trying to sit up, and was intubated for respiratory distress. RHC showed RA 25, PA 70/40, and PCWP 35, though cardiac output preserved by thermodilution. ABG showed significant resp acidosis, ventilator settings were changed improve minute ventilation. Given Nipride  with improvement in PA pressures. Given Lasix  and UOP is 650 cc since then. In the ICU, he has epistaxis, and nares were packed with gauze. On Dobutamine  5 mcg/kg/min, Levophed  10 mcg/min, Propofol  20 mcg/kg/min, Versed  10 mg/hr, and Fentanyl  200 mcg/hr.  On PRVC 24/600/+5/100%. Was difficult to sedate in cath, so given Versed  high dose. No hx of smoking.   Pertinent  Medical History  DM-2, morbid obesity, NHL (2005), dyslipidemia, chronic LBP  Significant Hospital Events: Including procedures, antibiotic start and stop dates in addition to other pertinent events   7/18: admitted to ICU after LHC/RHC with LAD perforation, intubated 7/19 remained intubated and sedated, urine output is decreasing with rising serum creatinine 7/20 decreased urine output, serum creatinine started trending up, FiO2 titrated down to 40% PEEP at 8, AF w/ RVR converted on amio 7/21 worsening hemodynamics s/p pericardial drain placed 7/22 extubated, Amio, and  dobutamine    Interim History / Subjective:  Remains intermittently emotional, no complaints other than feeling generalized weak  Remains on DBA 5, CVP 10-12, coox 59 Diuresed well, -2.5L  Objective    Blood pressure 116/72, pulse 80, temperature 98.8 F (37.1 C), temperature source Core, resp. rate (!) 23, height 5' 11 (1.803 m), weight (!) 144.4 kg, SpO2 95%. PAP: (28-338)/(17-303) 40/20 CVP:  [7 mmHg-33 mmHg] 7 mmHg CO:  [6.9 L/min] 6.9 L/min CI:  [2.66 L/min/m2-2.67 L/min/m2] 2.66 L/min/m2  Vent Mode: PRVC FiO2 (%):  [30 %] 30 % Set Rate:  [24 bmp] 24 bmp Vt Set:  [600 mL] 600 mL PEEP:  [8 cmH20] 8 cmH20 Pressure Support:  [8 cmH20] 8 cmH20 Plateau Pressure:  [25 cmH20] 25 cmH20   Intake/Output Summary (Last 24 hours) at 07/03/2024 0803 Last data filed at 07/03/2024 0600 Gross per 24 hour  Intake 2111.96 ml  Output 4595 ml  Net -2483.04 ml   Filed Weights   07/01/24 0315 07/02/24 0500 07/03/24 0500  Weight: (!) 149.7 kg (!) 149.9 kg (!) 144.4 kg    Examination: General:  weak appearing adult male lying in bed in NAD HEENT: MM pink/moist, pupils 2/r, bilateral conjunctival hemorrhages Neuro: Sleepy, easily awakens to verbal, oriented x 3, MAE CV: rr, NSR, subcostal pericardial drain, L radial aline, R femoral PA, +1 distal pulses PULM:  non labored, clear, diminished in bases, great on IS 1-1.2L GI: distended, +bs, mild upper tenderness, foley cyu, last BM overnight  Extremities: warm/dry, generalized edema, LE pitting 1-2+ Skin: no rashes   UOP 4.7L/ 24hrs Pericardial drain 92ml/ 24hrs -2.5L Net -2.4L Tmax 99.9  CVP 8-9 Dampened PA waveform>CO  6.89, CI 2.67, 41/24( 32)  Labs> pending CBC, K 3.5, improving renal indices 37/ 1.57, Mag 2.3  Patient Lines/Drains/Airways Status     Active Line/Drains/Airways     Name Placement date Placement time Site Days   Arterial Line 06/28/24 Left Radial 06/28/24  1955  Radial  3   Peripheral IV 06/28/24 20 G Left  Antecubital 06/28/24  1719  Antecubital  3   Sheath 06/28/24 Right Femoral;Venous 06/28/24  1920  Femoral;Venous  3   NG/OG Vented/Dual Lumen Oral External length of tube 43 cm 06/29/24  --  Oral  2   Urethral Catheter hadley rn 16 Fr. 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 with cardiogenic shock 7/21 echo-EF 20 to 25%, LV function severely reduced/RV moderately reduced, pericardial effusion circumferential P - per AHF - DBA/ coox/ diureses per AHF - adding spiro today - plans to remove pericardial drain today per cards, H/H remains stable, cont colchicine   - trend hemodynamics, remains off pressors - ASA/statin  Paroxysmal A-fib with RVR P: - remains in NSR - Amio per AHF - optimize electrolytes - no AC w/ LAD perforation/ hemopericardium  Acute respiratory failure with hypoxia and hypercapnia due to acute pulmonary edema Probable aspiration pneumonia P:  - extubated 7/22, doing well - cont to wean supplemental O2 for sat goal > 92% - cont zosyn  day 3 of 5 days planned  - cont pulm hygiene- great IS, mobility limited with femoral sheath - prn CXR   Leukocytosis, with elevated PCT - likely in setting of STEMI and suspected aspiration PNA - stable WBC - cont zosyn  for planned 5 days of therapy - follow Bcs - trend WBC/ fever curve   Acute metabolic encephalopathy due to hypoxia and hypercapnia, resolved P: - resolved, cont delirium precautions - minimize sedating meds   Poorly controlled DM-2 (HbA1c 7.8%), complicated with vasculopathy P: - remains in goal 140-180 - cont CBG q AC/HS, prn rSSI with semglee  20units  BID   Acute kidney injury due to ischemic ATN from shock/ cardiorenal P: - diuresing well, sCr down trending - lasix  as above - trend renal indices  - strict I/Os, daily wts - avoid nephrotoxins, renal dose meds, hemodynamic support as above  Shock liver LFTs are improving P: - trend periodically   Morbid obesity P: - diet/exercise counseling as appropriate  Cloudy urine Acute UTI was ruled out P: - d/c foley per AHF  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 - H/H remains stable - trend CBC/ transfuse per protocol   Best Practice (right click and Reselect all SmartList Selections daily)   Diet/type: heart healthy/ carb modified DVT prophylaxis SCD Pressure ulcer(s): N/A GI prophylaxis: H2B Lines: Central line and Arterial Line Foley:  Yes, and it is still needed Code Status:  full code Last date of multidisciplinary goals of care discussion [Per primary team]   CCT: 35 mins   Lyle Pesa, MSN, AG-ACNP-BC [facility] 07/03/2024, 8:03 AM  See Amion for pager If no response to pager , please call [phone] until 7pm After 7:00 pm call Elink  [phone]

## 2024-07-03 NOTE — Progress Notes (Signed)
  Echocardiogram 2D Echocardiogram has been performed.  Tinnie FORBES Gosling RDCS 07/03/2024, 2:28 PM

## 2024-07-03 NOTE — Plan of Care (Signed)
  Problem: Education: Goal: Knowledge of General Education information will improve Description: Including pain rating scale, medication(s)/side effects and non-pharmacologic comfort measures Outcome: Progressing   Problem: Pain Managment: Goal: General experience of comfort will improve and/or be controlled Outcome: Progressing

## 2024-07-03 NOTE — Progress Notes (Signed)
 PT Cancellation Note  Patient Details Name: Andrew Brock MRN: [id number] DOB: 1961/11/16   Cancelled Treatment:    Reason Eval/Treat Not Completed: Medical issues which prohibited therapy (Pt has femoral Swan in place. Nurse asked PT to HOLD.)   [name] [name] 07/03/2024, 11:16 AM [name],PT Acute Rehab Services [phone]

## 2024-07-04 ENCOUNTER — Other Ambulatory Visit: Payer: Self-pay

## 2024-07-04 ENCOUNTER — Inpatient Hospital Stay (HOSPITAL_COMMUNITY)

## 2024-07-04 DIAGNOSIS — I2102 ST elevation (STEMI) myocardial infarction involving left anterior descending coronary artery: Secondary | ICD-10-CM | POA: Diagnosis not present

## 2024-07-04 LAB — CBC
HCT: 30 % — ABNORMAL LOW (ref 39.0–52.0)
Hemoglobin: 9.6 g/dL — ABNORMAL LOW (ref 13.0–17.0)
MCH: 29.6 pg (ref 26.0–34.0)
MCHC: 32 g/dL (ref 30.0–36.0)
MCV: 92.6 fL (ref 80.0–100.0)
Platelets: 229 K/uL (ref 150–400)
RBC: 3.24 MIL/uL — ABNORMAL LOW (ref 4.22–5.81)
RDW: 14.9 % (ref 11.5–15.5)
WBC: 8.7 K/uL (ref 4.0–10.5)
nRBC: 0.5 % — ABNORMAL HIGH (ref 0.0–0.2)

## 2024-07-04 LAB — BASIC METABOLIC PANEL WITH GFR
Anion gap: 10 (ref 5–15)
BUN: 30 mg/dL — ABNORMAL HIGH (ref 6–20)
CO2: 27 mmol/L (ref 22–32)
Calcium: 8.2 mg/dL — ABNORMAL LOW (ref 8.9–10.3)
Chloride: 102 mmol/L (ref 98–111)
Creatinine, Ser: 1.49 mg/dL — ABNORMAL HIGH (ref 0.61–1.24)
GFR, Estimated: 53 mL/min — ABNORMAL LOW (ref >60)
Glucose, Bld: 146 mg/dL — ABNORMAL HIGH (ref 70–99)
Potassium: 3.7 mmol/L (ref 3.5–5.1)
Sodium: 139 mmol/L (ref 135–145)

## 2024-07-04 LAB — GLUCOSE, CAPILLARY
Glucose-Capillary: 133 mg/dL — ABNORMAL HIGH (ref 70–99)
Glucose-Capillary: 93 mg/dL (ref 70–99)
Glucose-Capillary: 149 mg/dL — ABNORMAL HIGH (ref 70–99)
Glucose-Capillary: 161 mg/dL — ABNORMAL HIGH (ref 70–99)
Glucose-Capillary: 137 mg/dL — ABNORMAL HIGH (ref 70–99)

## 2024-07-04 LAB — COOXEMETRY PANEL
Carboxyhemoglobin: 1.7 % — ABNORMAL HIGH (ref 0.5–1.5)
Methemoglobin: <0.7 % (ref 0.0–1.5)
O2 Saturation: 62.9 %
Total hemoglobin: 10.2 g/dL — ABNORMAL LOW (ref 12.0–16.0)

## 2024-07-04 LAB — MAGNESIUM: Magnesium: 2.5 mg/dL — ABNORMAL HIGH (ref 1.7–2.4)

## 2024-07-04 MED ORDER — AMIODARONE IV BOLUS ONLY 150 MG/100ML
150.0000 mg | Freq: Once | INTRAVENOUS | Status: AC
  Administered 2024-07-04: 150 mg via INTRAVENOUS

## 2024-07-04 MED ORDER — POTASSIUM CHLORIDE CRYS ER 20 MEQ PO TBCR
40.0000 meq | EXTENDED_RELEASE_TABLET | Freq: Two times a day (BID) | ORAL | Status: AC
Start: 2024-07-04 — End: 2024-07-04
  Administered 2024-07-04 (×2): 40 meq via ORAL
  Filled 2024-07-04 (×2): qty 2

## 2024-07-04 MED ORDER — SODIUM CHLORIDE 0.9% FLUSH: 10.0000 mL | INTRAVENOUS | Status: DC | PRN

## 2024-07-04 MED ORDER — FUROSEMIDE 10 MG/ML IJ SOLN
80.0000 mg | Freq: Two times a day (BID) | INTRAMUSCULAR | Status: AC
  Administered 2024-07-04: 80 mg via INTRAVENOUS
  Filled 2024-07-04: qty 8

## 2024-07-04 MED ORDER — ACETAZOLAMIDE 250 MG PO TABS
500.0000 mg | ORAL_TABLET | Freq: Once | ORAL | Status: AC
  Administered 2024-07-04: 500 mg via ORAL
  Filled 2024-07-04: qty 2

## 2024-07-04 MED ORDER — POTASSIUM CHLORIDE CRYS ER 20 MEQ PO TBCR
40.0000 meq | EXTENDED_RELEASE_TABLET | Freq: Once | ORAL | Status: DC
Start: 2024-07-04 — End: 2024-07-04

## 2024-07-04 MED ORDER — ORAL CARE MOUTH RINSE: 15.0000 mL | OROMUCOSAL | Status: DC | PRN

## 2024-07-04 NOTE — Progress Notes (Signed)
 Advanced Heart Failure Rounding Note  Cardiologist: None  AHF Consulting MD: Dr. Zenaida Chief Complaint: Acute biventricular systolic heart failure Patient Profile   63 y.o. male with a PMH of non-Hodgkin's lymphoma who presents with anterior STEMI complicated by distal LAD perforation. Now with CGS.  Subjective:    7/18: LHC c/b LAD perforation>CGS 7/20: in AF RVR, converted to NSR on IV amio 7/21: worsening hemodynamics s/p pericardiocentesis w drain placement (~500cc total out) 7/22: Extubated.  7/23: Back in AF. Pericardial drain removed. Echo w/o evidence of reaccumulation  Coox 63%. CVP 11. PAPi 1.5. On DBA 5.  5L UOP (net -3L). Cr stable.   Sitting up in bed. Having some pain at prior drain site. Having hallucinations.   Objective:    Weight Range: (!) 144.7 kg Body mass index is 44.49 kg/m.   Vital Signs:   Temp:  [98.6 F (37 C)-99.1 F (37.3 C)] 99 F (37.2 C) (07/24 0800) Pulse Rate:  [80-200] 90 (07/24 0800) Resp:  [13-29] 29 (07/24 0800) SpO2:  [90 %-99 %] 94 % (07/24 0800) Arterial Line BP: (98-146)/(56-76) 118/67 (07/24 0800) Weight:  [144.7 kg] 144.7 kg (07/24 0500) Last BM Date : 07/04/24  Weight change: Filed Weights   07/02/24 0500 07/03/24 0500 07/04/24 0500  Weight: (!) 149.9 kg (!) 144.4 kg (!) 144.7 kg   Intake/Output:  Intake/Output Summary (Last 24 hours) at 07/04/2024 0855 Last data filed at 07/04/2024 0800 Gross per 24 hour  Intake 2001.38 ml  Output 5085 ml  Net -3083.62 ml    Physical Exam    CVP 11 General: Well appearing. No distress on RA Cardiac: JVP 12cm. S1 and S2 present. Extremities: Warm and dry.  Trace generalized edema edema.  Neuro: Alert and oriented x3. Affect pleasant. Generalized weakness  Telemetry   AF 100s with frequent PVCs (personally reviewed)  Medications:    Scheduled Medications:  aspirin   81 mg Oral Daily   atorvastatin   80 mg Oral Daily   Chlorhexidine  Gluconate Cloth  6 each Topical  Daily   colchicine   0.6 mg Oral Daily   insulin  aspart  0-20 Units Subcutaneous TID WC   insulin  aspart  0-5 Units Subcutaneous QHS   insulin  glargine-yfgn  20 Units Subcutaneous BID   polyethylene glycol  17 g Oral BID   senna  2 tablet Oral QHS   sodium chloride  flush  3 mL Intravenous Q12H   spironolactone   25 mg Oral Daily    Infusions:  amiodarone  30 mg/hr (07/04/24 0800)   DOBUTamine  5 mcg/kg/min (07/04/24 0800)   piperacillin -tazobactam (ZOSYN )  IV 12.5 mL/hr at 07/04/24 0800    PRN Medications: acetaminophen , ondansetron  (ZOFRAN ) IV, mouth rinse, oxyCODONE , sodium chloride  flush  Assessment/Plan   Acute HFrEF with Cardiogenic shock: Biventricular, EF 20-25%, with mildly reduced RV. Suspect long standing given elevated PA pressures, cardiomyopathy out of proportion to ischemic disease. LA normal, however reduced CO/CI on PAC. S/P pericardiocentesis with drain placement with improvement of CO/CI. - CO 63% eFick CI 3 lpm. PAPi 1.5. Wean DBA 2.5 mcg/kg/min. - CVP 11. Give IV Lasix  80 mg bid + diamox  500 mg x1 - removed swan and introducer. Place PICC to follow coox - continue spiro 25 mg daily - Does not appear to have significant social support at home and given obesity is not a heart transplant candidate, marginal LVAD candidate given RV failure  CAD with anterior STEMI: Distal LAD disease, potentially embolic with clear cut off sign. Case complicated by distal  perforation. - Continue aspirin . Hold P2Y12 with perforation and growing pericardial effusion - Consider anticoagulation once he is stable from an effusion standpoint, suspect this is embolic - Discuss timing with Dr. Gardenia  Pericardial effusion: Traumatic with recent perforation.  - Mild worsening on echo 7/20 some slight TV variation but no other evidence of tamponade - Echo 7/21 with increased circumferential effusion without signs of tamponade on echo, however with worsening hemodynamics - S/P  pericardiocentesis with drain placement with improvement of CO/CI (500 cc total sanguineous output) - Pull drain 7/23; hgb stable; hemodynamics stable; no effusion on echo yesterday  Acute hypoxic respiratory failure:  - Due to pulmonary edema - Extubated 7/22 - concern for possible aspiration PNA with leukocytosis and procal 3 - leukocytosis, resolved - on Zosyn , complete on 7/25  AKI: Due to shock and cardiorenal syndrome.  - Cr stable, 1.5 today - Diuresing well; continue as above  Paroxysmal Atrial Fibrillation: - new this admit with RVR - converted to NSR on IV amio; continue while on DBA - back in AF yesterday, rate controlled this am - give bolus this am - discuss AC timing with MD  ICU delirium - having hallucinations - maintain sleep schedule - out of ICU today  Removed Aline and foley catheter. Can transfer to progressive care today.   Length of Stay: 6  [place] Shalev Helminiak, NP  07/04/2024, 8:55 AM  Advanced Heart Failure Team Pager [phone] (M-F; 7a - 5p)  Please contact [facility] for night-coverage after hours (5p -7a ) and weekends on [email or web address]

## 2024-07-04 NOTE — Evaluation (Signed)
 Physical Therapy Evaluation Patient Details Name: Andrew Brock MRN: [id number] DOB: 11/11/1961 Today's Date: 07/04/2024  History of Present Illness  63 yo male presents to [facility] ED on 06/28/24 with chest pain activated as a STEMI. S/p 7/18 LHC/RHC. S/p 7/21 pericardiocentesis. Femoral line removed 7/24. PMH: morbid obesity, DM, NHL, and dyslipidemia  Clinical Impression  Pt was limited today due to pain in the R groin. Pt currently is Min A for bed mobility with utilization of bed features, 2 person Mod A for sit to stand and step pivot transfer with bil HHA secondary to pain/weakness. Pt had difficulty transferring wgt to RLE due to pain. Pt was very ind, working as Office manager prior to hospitalization. Due to pt current functional status, home set up and available assistance at home recommending skilled physical therapy services > 3 hours/day in order to address strength, balance and functional mobility to decrease risk for falls, injury, immobility, skin break down and re-hospitalization.          If plan is discharge home, recommend the following: A lot of help with walking and/or transfers;Help with stairs or ramp for entrance;Assistance with cooking/housework;Assist for transportation     Equipment Recommendations Rolling walker (2 wheels);BSC/3in1  Recommendations for Other Services  Rehab consult;OT consult    Functional Status Assessment Patient has had a recent decline in their functional status and demonstrates the ability to make significant improvements in function in a reasonable and predictable amount of time.     Precautions / Restrictions Precautions Precautions: Fall Recall of Precautions/Restrictions: Intact Precaution/Restrictions Comments: PICC line Restrictions Weight Bearing Restrictions Per Provider Order: No      Mobility  Bed Mobility Overal bed mobility: Needs Assistance Bed Mobility: Supine to Sit     Supine to sit: HOB elevated, Min assist     General  bed mobility comments: Min A to pull up to upright position with cues for sequencing and Min A to scoot to EOB due to pain    Transfers Overall transfer level: Needs assistance Equipment used: 2 person hand held assist Transfers: Sit to/from Stand, Bed to chair/wheelchair/BSC Sit to Stand: Mod assist, +2 physical assistance   Step pivot transfers: Mod assist, +2 safety/equipment, +2 physical assistance       General transfer comment: Mod A for initial momentum to get to standing from sitting due to weakness/pain. pt had difficulty WB through the RLE and requires mIn A for wgt shifting once in standing to progress the LLE toward recliner with heavy Mod UE support from physical therapist and RN    Ambulation/Gait     Pre-gait activities: Pt took steps from EOB to recliner with 2 person MoD A with HHA, very short partial step through steps, flat foot initial contact, wide BOS      Balance Overall balance assessment: Needs assistance Sitting-balance support: Bilateral upper extremity supported, Feet supported Sitting balance-Leahy Scale: Fair   Postural control: Posterior lean Standing balance support: Bilateral upper extremity supported, During functional activity Standing balance-Leahy Scale: Poor Standing balance comment: Mod A +2 for balance with stiff leg movements due to pain in the R groin         Pertinent Vitals/Pain Pain Assessment Pain Assessment: Faces Faces Pain Scale: Hurts whole lot Breathing: occasional labored breathing, short period of hyperventilation Negative Vocalization: occasional moan/groan, low speech, negative/disapproving quality Facial Expression: sad, frightened, frown Body Language: tense, distressed pacing, fidgeting Consolability: no need to console PAINAD Score: 4 Pain Location: R groin where femoral line was  removed. Pain Descriptors / Indicators: Aching, Burning, Shooting, Sharp Pain Intervention(s): Monitored during session, Limited  activity within patient's tolerance    Home Living Family/patient expects to be discharged to:: Private residence Living Arrangements: Alone Available Help at Discharge: Family;Available PRN/intermittently Type of Home: Apartment Home Access: Level entry       Home Layout: One level Home Equipment: None      Prior Function Prior Level of Function : Independent/Modified Independent;Working/employed;Driving             Mobility Comments: works Office manager at [employer] ADLs Comments: Ind with all ADL's and IADL's.     Extremity/Trunk Assessment   Upper Extremity Assessment Upper Extremity Assessment: Overall WFL for tasks assessed    Lower Extremity Assessment Lower Extremity Assessment: RLE deficits/detail RLE Deficits / Details: pain in the R groin with difficulty WB    Cervical / Trunk Assessment Cervical / Trunk Assessment: Normal  Communication   Communication Communication: No apparent difficulties    Cognition Arousal: Alert Behavior During Therapy: WFL for tasks assessed/performed   PT - Cognitive impairments: No apparent impairments       Following commands: Intact       Cueing Cueing Techniques: Verbal cues     General Comments General comments (skin integrity, edema, etc.): Swelling in bil LE with R>L        Assessment/Plan    PT Assessment Patient needs continued PT services  PT Problem List Decreased strength;Decreased activity tolerance;Decreased balance;Decreased mobility;Pain       PT Treatment Interventions DME instruction;Balance training;Gait training;Neuromuscular re-education;Functional mobility training;Therapeutic activities;Therapeutic exercise;Patient/family education    PT Goals (Current goals can be found in the Care Plan section)  Acute Rehab PT Goals Patient Stated Goal: to decrease pain and improve mobility PT Goal Formulation: With patient Time For Goal Achievement: 07/18/24 Potential to Achieve Goals: Good     Frequency Min 3X/week        AM-PAC PT 6 Clicks Mobility  Outcome Measure Help needed turning from your back to your side while in a flat bed without using bedrails?: A Little Help needed moving from lying on your back to sitting on the side of a flat bed without using bedrails?: A Little Help needed moving to and from a bed to a chair (including a wheelchair)?: A Lot Help needed standing up from a chair using your arms (e.g., wheelchair or bedside chair)?: A Lot Help needed to walk in hospital room?: Total Help needed climbing 3-5 steps with a railing? : Total 6 Click Score: 12    End of Session   Activity Tolerance: Patient tolerated treatment well;Patient limited by pain Patient left: in chair;with call bell/phone within reach;with nursing/sitter in room Nurse Communication: Mobility status PT Visit Diagnosis: Unsteadiness on feet (R26.81);Other abnormalities of gait and mobility (R26.89);Muscle weakness (generalized) (M62.81)    Time: 8499-8481 PT Time Calculation (min) (ACUTE ONLY): 18 min   Charges:   PT Evaluation $PT Eval Low Complexity: 1 Low   PT General Charges $$ ACUTE PT VISIT: 1 Visit         Dorothyann Maier, DPT, CLT  Acute Rehabilitation Services Office: [phone] (Secure chat preferred)   Dorothyann VEAR Maier 07/04/2024, 3:40 PM

## 2024-07-04 NOTE — Progress Notes (Signed)
 Secure chat with Katelyn RN re removal of CVC once PICC placement confirmed.  States will remove CVC at that time.

## 2024-07-04 NOTE — Progress Notes (Signed)
 1720: Patient arrived to unit, transfer from [facility], via wheelchair. Pt able to stand on standing scale with x2 assist and walker. Pt noted to be in Afib rhythm on amio drip (per [facility]), rate peaking in 130's/140's with standing/sitting upright. Patient denies chest pain, dizziness/lightheadedness, or shortness of breath at this time.

## 2024-07-04 NOTE — Progress Notes (Signed)
 Inpatient Rehab Admissions Coordinator Note:   Per PT recommendations patient was screened for CIR candidacy by Reche FORBES Lowers, PT. At this time, pt appears to be a potential candidate for CIR. I will place an order for rehab consult for full assessment, per our protocol.  Please contact me any with questions.SABRA Reche Lowers, PT, DPT [phone] 07/04/24 4:36 PM

## 2024-07-04 NOTE — Plan of Care (Signed)

## 2024-07-04 NOTE — Plan of Care (Signed)
  Problem: Education: Goal: Knowledge of General Education information will improve Description: Including pain rating scale, medication(s)/side effects and non-pharmacologic comfort measures Outcome: Progressing   Problem: Clinical Measurements: Goal: Respiratory complications will improve Outcome: Progressing   Problem: Activity: Goal: Risk for activity intolerance will decrease Outcome: Progressing   Problem: Nutrition: Goal: Adequate nutrition will be maintained Outcome: Progressing   Problem: Coping: Goal: Level of anxiety will decrease Outcome: Progressing   Problem: Fluid Volume: Goal: Ability to maintain a balanced intake and output will improve Outcome: Progressing   Problem: Metabolic: Goal: Ability to maintain appropriate glucose levels will improve Outcome: Progressing

## 2024-07-04 NOTE — Progress Notes (Signed)
 Right femoral sheath removed by Hadassah PEAK. Manual pressure held hemostasis achieved.

## 2024-07-04 NOTE — Progress Notes (Signed)
 Peripherally Inserted Central Catheter Placement  The IV Nurse has discussed with the patient and/or persons authorized to consent for the patient, the purpose of this procedure and the potential benefits and risks involved with this procedure.  The benefits include less needle sticks, lab draws from the catheter, and the patient may be discharged home with the catheter. Risks include, but not limited to, infection, bleeding, blood clot (thrombus formation), and puncture of an artery; nerve damage and irregular heartbeat and possibility to perform a PICC exchange if needed/ordered by physician.  Alternatives to this procedure were also discussed.  Bard Power PICC patient education guide, fact sheet on infection prevention and patient information card has been provided to patient /or left at bedside.    PICC Placement Documentation  PICC Double Lumen 07/04/24 Right Basilic 44 cm (Active)  Indication for Insertion or Continuance of Line Vasoactive infusions 07/04/24 1100  Exposed Catheter (cm) 0 cm 07/04/24 1100  Site Assessment Clean, Dry, Intact 07/04/24 1100  Lumen #1 Status Flushed;Saline locked;Blood return noted 07/04/24 1100  Lumen #2 Status Flushed;Saline locked;Blood return noted 07/04/24 1100  Dressing Type Transparent;Securing device 07/04/24 1100  Dressing Status Antimicrobial disc/dressing in place 07/04/24 1100  Line Care Connections checked and tightened 07/04/24 1100  Line Adjustment (NICU/IV Team Only) No 07/04/24 1100  Dressing Intervention New dressing;Adhesive placed at insertion site (IV team only) 07/04/24 1100  Dressing Change Due 07/11/24 07/04/24 1100       Hart Haas 07/04/2024, 11:23 AM

## 2024-07-05 ENCOUNTER — Inpatient Hospital Stay (HOSPITAL_COMMUNITY)

## 2024-07-05 DIAGNOSIS — I3139 Other pericardial effusion (noninflammatory): Secondary | ICD-10-CM

## 2024-07-05 DIAGNOSIS — I2102 ST elevation (STEMI) myocardial infarction involving left anterior descending coronary artery: Secondary | ICD-10-CM | POA: Diagnosis not present

## 2024-07-05 LAB — CULTURE, BLOOD (ROUTINE X 2)
Culture: NO GROWTH
Culture: NO GROWTH

## 2024-07-05 LAB — GLUCOSE, CAPILLARY
Glucose-Capillary: 168 mg/dL — ABNORMAL HIGH (ref 70–99)
Glucose-Capillary: 135 mg/dL — ABNORMAL HIGH (ref 70–99)
Glucose-Capillary: 116 mg/dL — ABNORMAL HIGH (ref 70–99)
Glucose-Capillary: 175 mg/dL — ABNORMAL HIGH (ref 70–99)

## 2024-07-05 LAB — CBC
HCT: 32.5 % — ABNORMAL LOW (ref 39.0–52.0)
Hemoglobin: 10.4 g/dL — ABNORMAL LOW (ref 13.0–17.0)
MCH: 29.1 pg (ref 26.0–34.0)
MCHC: 32 g/dL (ref 30.0–36.0)
MCV: 90.8 fL (ref 80.0–100.0)
Platelets: 280 K/uL (ref 150–400)
RBC: 3.58 MIL/uL — ABNORMAL LOW (ref 4.22–5.81)
RDW: 14.7 % (ref 11.5–15.5)
WBC: 9.9 K/uL (ref 4.0–10.5)
nRBC: 0.5 % — ABNORMAL HIGH (ref 0.0–0.2)

## 2024-07-05 LAB — BASIC METABOLIC PANEL WITH GFR
Anion gap: 10 (ref 5–15)
BUN: 29 mg/dL — ABNORMAL HIGH (ref 6–20)
CO2: 24 mmol/L (ref 22–32)
Calcium: 8.4 mg/dL — ABNORMAL LOW (ref 8.9–10.3)
Chloride: 104 mmol/L (ref 98–111)
Creatinine, Ser: 1.38 mg/dL — ABNORMAL HIGH (ref 0.61–1.24)
GFR, Estimated: 59 mL/min — ABNORMAL LOW (ref >60)
Glucose, Bld: 142 mg/dL — ABNORMAL HIGH (ref 70–99)
Potassium: 3.7 mmol/L (ref 3.5–5.1)
Sodium: 138 mmol/L (ref 135–145)

## 2024-07-05 LAB — COOXEMETRY PANEL
Carboxyhemoglobin: 2.3 % — ABNORMAL HIGH (ref 0.5–1.5)
Methemoglobin: 0.7 % (ref 0.0–1.5)
O2 Saturation: 56.2 %
Total hemoglobin: 11.1 g/dL — ABNORMAL LOW (ref 12.0–16.0)

## 2024-07-05 LAB — ECHOCARDIOGRAM LIMITED
Area-P 1/2: 4.39 cm2
Height: 71 in
S' Lateral: 4.9 cm
Weight: 4955.94 [oz_av]

## 2024-07-05 MED ORDER — DIGOXIN 125 MCG PO TABS
0.1250 mg | ORAL_TABLET | Freq: Every day | ORAL | Status: DC
  Administered 2024-07-05 – 2024-07-08 (×4): 0.125 mg via ORAL
  Filled 2024-07-05 (×4): qty 1

## 2024-07-05 MED ORDER — LOSARTAN POTASSIUM 25 MG PO TABS
25.0000 mg | ORAL_TABLET | Freq: Every day | ORAL | Status: DC
  Administered 2024-07-05: 25 mg via ORAL
  Filled 2024-07-05 (×4): qty 1

## 2024-07-05 MED ORDER — ACETAZOLAMIDE 250 MG PO TABS
500.0000 mg | ORAL_TABLET | Freq: Once | ORAL | Status: AC
  Administered 2024-07-05: 500 mg via ORAL
  Filled 2024-07-05: qty 2

## 2024-07-05 MED ORDER — PERFLUTREN LIPID MICROSPHERE
1.0000 mL | INTRAVENOUS | Status: AC | PRN
  Administered 2024-07-05: 2 mL via INTRAVENOUS

## 2024-07-05 MED ORDER — POTASSIUM CHLORIDE CRYS ER 20 MEQ PO TBCR
40.0000 meq | EXTENDED_RELEASE_TABLET | Freq: Two times a day (BID) | ORAL | Status: AC
  Administered 2024-07-05 (×2): 40 meq via ORAL
  Filled 2024-07-05 (×2): qty 2

## 2024-07-05 MED ORDER — FUROSEMIDE 10 MG/ML IJ SOLN
80.0000 mg | Freq: Two times a day (BID) | INTRAMUSCULAR | Status: AC
  Administered 2024-07-05 – 2024-07-06 (×2): 80 mg via INTRAVENOUS
  Filled 2024-07-05 (×2): qty 8

## 2024-07-05 NOTE — Progress Notes (Signed)
 IVT consult placed for additional PIV access. Patient presently has double lumen PICC in place. CVP monitoring has been ordered and will utilize one of the two lumen's.  sent to Manuelita Dutch, PA to inquire about whether or not monitoring can be paused for the duration of abx therapy (which is due to be completed today) and restarted once infusion completed. Manuelita is ok with CVP monitoring being held during abx therapy infusion and re-started once completed to avoid additional access need. Additional access increases risk for infection and infiltration. Will complete consult at this time.   Bishoy Cupp R Tavone Caesar, RN

## 2024-07-05 NOTE — Progress Notes (Signed)
 Physical Therapy Treatment Patient Details Name: Andrew Brock MRN: [id number] DOB: Jan 12, 1961 Today's Date: 07/05/2024   History of Present Illness 63 yo male presents to [facility] ED on 06/28/24 with chest pain activated as a STEMI. S/p 7/18 LHC/RHC. S/p 7/21 pericardiocentesis. Femoral line removed 7/24. PMH: morbid obesity, DM, NHL, and dyslipidemia    PT Comments  Pt received in supine, agreeable to therapy session with emphasis on activity pacing, transfer safety and seated/standing exercises for strengthening. Pt needing up to minA for transfer safety and step pivot to chair. HR elevated to 121 bpm with standing/pivotal transfer and standing exercises, ~70-80 bpm at rest/seated. SpO2 88% on RA when PTA arrived but with repositioning and use of IS, SpO2 93% on RA and also while in chair. Pt making improvements toward goals, progressed to light minA for transfers and standing exercises at RW, anticipate he remains a good candidate for short term higher intensity post-acute therapies upon DC but may benefit from SLP cog eval as his problem solving does not appear to be at baseline. 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 Patient will benefit from intensive inpatient follow-up therapy, >3 hours/day.    If plan is discharge home, recommend the following: A lot of help with walking and/or transfers;Help with stairs or ramp for entrance;Assistance with cooking/housework;Assist for transportation   Can travel by private vehicle        Equipment Recommendations  Rolling walker (2 wheels);BSC/3in1    Recommendations for Other Services Other (comment);Speech consult (SLP cog eval)     Precautions / Restrictions Precautions Precautions: Fall Recall of Precautions/Restrictions: Intact Precaution/Restrictions Comments: PICC line Restrictions Weight Bearing Restrictions Per Provider Order: No      Mobility  Bed Mobility Overal bed mobility: Needs Assistance Bed Mobility: Supine to Sit     Supine to sit: HOB elevated, Contact guard, Used rails     General bed mobility comments: to R EOB, increased time to perform, reliant on bed features    Transfers Overall transfer level: Needs assistance Equipment used: Rolling walker (2 wheels), Ambulation equipment used Transfers: Sit to/from Stand, Bed to chair/wheelchair/BSC Sit to Stand: Min assist, From elevated surface   Step pivot transfers: Min assist       General transfer comment: EOB<>Stedy with minA, then from EOB>RW>chair on his R side with minA, mod safety cues and increased time to perform.    Ambulation/Gait Ambulation/Gait assistance: Min assist   Assistive device: Rolling walker (2 wheels) Gait Pattern/deviations: Step-to pattern, Decreased dorsiflexion - left, Decreased dorsiflexion - right, Shuffle     Pre-gait activities: standing hip flexion x10 reps ea at RW General Gait Details: HR to ~121 bpm with pivotal and side/backward stepping to chair on his R side so defer longer distance as pt has been on/off tachy throughout the day. SpO2 88% on RA with activity but improves with standing breaks and cues for pursed-lip breathing to 93% in chair.   Stairs             Wheelchair Mobility     Tilt Bed    Modified Rankin (Stroke Patients Only)       Balance Overall balance assessment: Needs assistance Sitting-balance support: Feet supported, No upper extremity supported Sitting balance-Leahy Scale: Fair Sitting balance - Comments: EOB   Standing balance support: Bilateral upper extremity supported, During functional activity Standing balance-Leahy Scale: Poor Standing balance comment: minA  at [facility] for standing exercises                            Communication Communication Communication: No apparent difficulties  Cognition Arousal: Alert Behavior During Therapy: WFL for tasks  assessed/performed   PT - Cognitive impairments: Problem solving, Safety/Judgement                       PT - Cognition Comments: Pt unable to answer question how much money would you have if I gave you seven quarters? even with increased time to solve and hints. 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 Pt otherwise A&O but decreased insight into safety/deficits. Following commands: Intact      Cueing Cueing Techniques: Verbal cues, Gestural cues  Exercises Other Exercises Other Exercises: standing BLE AROM: heel raises x5 reps, hip flexion x10 reps at RW Other Exercises: seated BLE AROM: LAQ, hip flexion x10 reps ea Other Exercises: IS x 5 reps pt achieves ~1250-1500 encouraged hourly use Other Exercises: STS x 3 reps with cues for UE placement    General Comments General comments (skin integrity, edema, etc.): see gait comments      Pertinent Vitals/Pain Pain Assessment Pain Assessment: Faces Faces Pain Scale: Hurts little more Pain Location: R groin, intermittent c/o pain in ankles with BLE in dependent posture sitting in chair Pain Descriptors / Indicators: Discomfort, Grimacing, Guarding Pain Intervention(s): Monitored during session, Repositioned    Home Living                          Prior Function            PT Goals (current goals can now be found in the care plan section) Acute Rehab PT Goals Patient Stated Goal: to decrease pain and improve mobility PT Goal Formulation: With patient Time For Goal Achievement: 07/18/24 Progress towards PT goals: Progressing toward goals    Frequency    Min 3X/week      PT Plan      Co-evaluation              AM-PAC PT 6 Clicks Mobility   Outcome Measure  Help needed turning from your back to your side while in a flat bed without using bedrails?: A Little Help needed moving from lying on your back to sitting on the side of a flat bed without using bedrails?: A  Little Help needed moving to and from a bed to a chair (including a wheelchair)?: A Little Help needed standing up from a chair using your arms (e.g., wheelchair or bedside chair)?: A Little Help needed to walk in hospital room?: Total (<73ft) Help needed climbing 3-5 steps with a railing? : Total 6 Click Score: 14    End of Session Equipment Utilized During Treatment: Gait belt Activity Tolerance: Patient tolerated treatment well Patient left: in chair;with call bell/phone within reach;with chair alarm set;Other (comment) (BLE reclined and slightly elevated in chair) Nurse Communication: Mobility status (pt IV beeping) PT Visit Diagnosis: Unsteadiness on feet (R26.81);Other abnormalities of gait and mobility (R26.89);Muscle weakness (generalized) (M62.81)     Time: 8579-8496 PT Time Calculation (min) (ACUTE ONLY): 43 min  Charges:    $Therapeutic Exercise: 8-22 mins $Therapeutic Activity: 23-37 mins PT General Charges $$ ACUTE PT VISIT: 1 Visit  Connell SQUIBB., PTA Acute Rehabilitation Services Secure Chat Preferred 9a-5:30pm Office: [phone]    Connell HERO [facility] 07/05/2024, 5:11 PM

## 2024-07-05 NOTE — PMR Pre-admission (Shared)
 PMR Admission Coordinator Pre-Admission Assessment  Patient: Andrew Brock is an 63 y.o., male MRN: [id number] DOB: [birth year] Height: 5' 11 (180.3 cm) Weight: (!) 140.5 kg  Insurance Information HMO: ***    PPO: ***     PCP: ***     IPA: ***     80/20: ***     OTHER: *** [facility]      Policy#: [id number]      Subscriber: pt CM Name: ***      Phone#: ***     Fax#: *** Pre-Cert#: ***      Employer: *** Benefits:  Phone #: ***     Name: *** Eustacio. Date: ***     Deduct: ***      Out of Pocket Max: ***      Life Max: *** CIR: ***      SNF: *** Outpatient: ***     Co-Pay: *** Home Health: ***      Co-Pay: *** DME: ***     Co-Pay: *** Providers: *** SECONDARY: [employer]      Policy#: [id number]     Phone#: ***  Financial Counselor:       Phone#:   The "Data Collection Information Summary" for patients in Inpatient Rehabilitation Facilities with attached "Privacy Act Statement-Health Care Records" was provided and verbally reviewed with: Patient  Emergency Contact Information Contact Information     Name Relation Home Work Mobile   [place] Brother [phone]        Other Contacts   None on File     Current Medical History  Patient Admitting Diagnosis: Debility  History of Present Illness: ***    Patient's medical record from [facility] has been reviewed by the rehabilitation admission coordinator and physician.  Past Medical History  Past Medical History:  Diagnosis Date   Cancer (HCC)    hodgkin lymphoma 2005, treated in [place]   Diabetes mellitus (HCC)    Lateral meniscus tear    right knee   Plantar fasciitis    Has the patient had major surgery during 100 days prior to admission? Yes  Family History   family history is not on file.  Current Medications  Current Facility-Administered Medications:    acetaminophen  (TYLENOL ) tablet 650 mg, 650 mg, Oral, Q4H PRN, Stoner, Benjamin J, MD, 650 mg at 07/04/24 1233   [COMPLETED]  amiodarone  (NEXTERONE ) 1.8 mg/mL load via infusion 150 mg, 150 mg, Intravenous, Once, 150 mg at 06/30/24 2332 **FOLLOWED BY** [EXPIRED] amiodarone  (NEXTERONE  PREMIX) 360-4.14 MG/200ML-% (1.8 mg/mL) IV infusion, 60 mg/hr, Intravenous, Continuous, Stopped at 07/01/24 0544 **FOLLOWED BY** amiodarone  (NEXTERONE  PREMIX) 360-4.14 MG/200ML-% (1.8 mg/mL) IV infusion, 30 mg/hr, Intravenous, Continuous, [place], Peter M, MD, Last Rate: 16.67 mL/hr at 07/05/24 1037, 30 mg/hr at 07/05/24 1037   aspirin  chewable tablet 81 mg, 81 mg, Oral, Daily, Chand, Sudham, MD, 81 mg at 07/05/24 9093   atorvastatin  (LIPITOR) tablet 80 mg, 80 mg, Oral, Daily, Chand, Sudham, MD, 80 mg at 07/05/24 9093   Chlorhexidine  Gluconate Cloth 2 % PADS 6 each, 6 each, Topical, Daily, Jordan, Peter M, MD, 6 each at 07/05/24 9051   colchicine  tablet 0.6 mg, 0.6 mg, Oral, Daily, Chand, Sudham, MD, 0.6 mg at 07/05/24 9093   digoxin (LANOXIN) tablet 0.125 mg, 0.125 mg, Oral, Daily, Zenaida Morene PARAS, MD, 0.125 mg at 07/05/24 1103   DOBUTamine  (DOBUTREX ) infusion 4000 mcg/mL, 2.5 mcg/kg/min, Intravenous, Continuous, Lee, [place], NP, Last Rate: 5.46 mL/hr at 07/05/24 1038, 2.5 mcg/kg/min at  07/05/24 1038   furosemide  (LASIX ) injection 80 mg, 80 mg, Intravenous, BID, Colletta Manuelita Garre, PA-C   insulin  aspart (novoLOG ) injection 0-20 Units, 0-20 Units, Subcutaneous, TID WC, Chand, Sudham, MD, 3 Units at 07/05/24 1208   insulin  aspart (novoLOG ) injection 0-5 Units, 0-5 Units, Subcutaneous, QHS, Chand, Sudham, MD   insulin  glargine-yfgn (SEMGLEE ) injection 20 Units, 20 Units, Subcutaneous, BID, Jordan, Peter M, MD, 20 Units at 07/05/24 9092   losartan (COZAAR) tablet 25 mg, 25 mg, Oral, Daily, Colletta Manuelita Garre, PA-C, 25 mg at 07/05/24 1048   ondansetron  (ZOFRAN ) injection 4 mg, 4 mg, Intravenous, Q6H PRN, Jordan, Peter M, MD   Oral care mouth rinse, 15 mL, Mouth Rinse, PRN, Zenaida Morene PARAS, MD   oxyCODONE  (Oxy IR/ROXICODONE ) immediate  release tablet 5 mg, 5 mg, Oral, Q6H PRN, Lee, [place], NP, 5 mg at 07/05/24 0544   piperacillin -tazobactam (ZOSYN ) IVPB 3.375 g, 3.375 g, Intravenous, Q8H, Chand, Sudham, MD, Last Rate: 12.5 mL/hr at 07/05/24 1212, 3.375 g at 07/05/24 1212   polyethylene glycol (MIRALAX  / GLYCOLAX ) packet 17 g, 17 g, Oral, BID, Chand, Sudham, MD   potassium chloride  SA (KLOR-CON  M) CR tablet 40 mEq, 40 mEq, Oral, BID, Colletta Manuelita Garre, PA-C, 40 mEq at 07/05/24 1048   senna (SENOKOT) tablet 17.2 mg, 2 tablet, Oral, QHS, Chand, Sudham, MD, 17.2 mg at 07/03/24 2230   sodium chloride  flush (NS) 0.9 % injection 10-40 mL, 10-40 mL, Intracatheter, PRN, Zenaida Morene PARAS, MD   sodium chloride  flush (NS) 0.9 % injection 3 mL, 3 mL, Intravenous, Q12H, [place], Peter M, MD, 3 mL at 07/05/24 1049   sodium chloride  flush (NS) 0.9 % injection 3 mL, 3 mL, Intravenous, PRN, Jordan, Peter M, MD   spironolactone  (ALDACTONE ) tablet 25 mg, 25 mg, Oral, Daily, Lee, [place], NP, 25 mg at 07/05/24 0906  Patients Current Diet:  Diet Order             Diet heart healthy/carb modified Room service appropriate? Yes; Fluid consistency: Thin; Fluid restriction: 1500 mL Fluid  Diet effective now                   Precautions / Restrictions Precautions Precautions: Fall Precaution/Restrictions Comments: PICC line Restrictions Weight Bearing Restrictions Per Provider Order: No   Has the patient had 2 or more falls or a fall with injury in the past year? No  Prior Activity Level Community (5-7x/wk): independent and working, driving  Prior Functional Level Self Care: Did the patient need help bathing, dressing, using the toilet or eating? Independent  Indoor Mobility: Did the patient need assistance with walking from room to room (with or without device)? Independent  Stairs: Did the patient need assistance with internal or external stairs (with or without device)? Independent  Functional Cognition: Did the patient need  help planning regular tasks such as shopping or remembering to take medications? Independent  Patient Information Are you of Hispanic, Latino/a,or Spanish origin?: A. No, not of Hispanic, Latino/a, or Spanish origin What is your race?: B. Black or African American Do you need or want an interpreter to communicate with a doctor or health care staff?: 0. No  Patient's Response To:  Health Literacy and Transportation Is the patient able to respond to health literacy and transportation needs?: Yes Health Literacy - How often do you need to have someone help you when you read instructions, pamphlets, or other written material from your doctor or pharmacy?: Never In the past 12 months, has lack  of transportation kept you from medical appointments or from getting medications?: No In the past 12 months, has lack of transportation kept you from meetings, work, or from getting things needed for daily living?: No  Journalist, newspaper / Equipment Home Equipment: None  Prior Device Use: Indicate devices/aids used by the patient prior to current illness, exacerbation or injury? None of the above  Current Functional Level Cognition  Orientation Level: Oriented X4    Extremity Assessment (includes Sensation/Coordination)  Upper Extremity Assessment: Overall WFL for tasks assessed  Lower Extremity Assessment: RLE deficits/detail RLE Deficits / Details: pain in the R groin with difficulty WB    ADLs       Mobility  Overal bed mobility: Needs Assistance Bed Mobility: Supine to Sit Supine to sit: HOB elevated, Min assist General bed mobility comments: Min A to pull up to upright position with cues for sequencing and Min A to scoot to EOB due to pain    Transfers  Overall transfer level: Needs assistance Equipment used: 2 person hand held assist Transfers: Sit to/from Stand, Bed to chair/wheelchair/BSC Sit to Stand: Mod assist, +2 physical assistance Bed to/from chair/wheelchair/BSC transfer  type:: Step pivot Step pivot transfers: Mod assist, +2 safety/equipment, +2 physical assistance General transfer comment: Mod A for initial momentum to get to standing from sitting due to weakness/pain. pt had difficulty WB through the RLE and requires mIn A for wgt shifting once in standing to progress the LLE toward recliner with heavy Mod UE support from physical therapist and RN    Ambulation / Gait / Stairs / Wheelchair Mobility  Ambulation/Gait Pre-gait activities: Pt took steps from EOB to recliner with 2 person MoD A with HHA, very short partial step through steps, flat foot initial contact, wide BOS    Posture / Balance Balance Overall balance assessment: Needs assistance Sitting-balance support: Bilateral upper extremity supported, Feet supported Sitting balance-Leahy Scale: Fair Postural control: Posterior lean Standing balance support: Bilateral upper extremity supported, During functional activity Standing balance-Leahy Scale: Poor Standing balance comment: Mod A +2 for balance with stiff leg movements due to pain in the R groin    Special needs/care consideration    Previous Home Environment  Living Arrangements: Alone  Lives With: Alone Available Help at Discharge:  (brother local only prn) Type of Home: Apartment Home Layout: One level Home Access: Level entry Bathroom Shower/Tub: Associate Professor: No Home Care Services: No  Discharge Living Setting Plans for Discharge Living Setting: Patient's home, Alone Type of Home at Discharge: Apartment Discharge Home Layout: One level Discharge Home Access: Level entry Discharge Bathroom Shower/Tub: Tub/shower unit Discharge Bathroom Toilet: Standard Does the patient have any problems obtaining your medications?: Yes (Describe)  Social/Family/Support Systems Patient Roles:  (employee) Contact Information: brother, Ron Anticipated Caregiver: alone, brother  prn Anticipated Caregiver's Contact Information: see contacts Ability/Limitations of Caregiver: prn only Caregiver Availability: Intermittent Discharge Plan Discussed with Primary Caregiver: Yes Is Caregiver In Agreement with Plan?: Yes Does Caregiver/Family have Issues with Lodging/Transportation while Pt is in Rehab?: No  Goals Patient/Family Goal for Rehab: Mod I with PT, OT, SLP Expected length of stay: ELOS 7 days Pt/Family Agrees to Admission and willing to participate: Yes Program Orientation Provided & Reviewed with Pt/Caregiver Including Roles  & Responsibilities: Yes  Decrease burden of Care through IP rehab admission: n/a  Possible need for SNF placement upon discharge: not anticipated  Patient Condition: I have reviewed medical records from [facility], spoken  with patient. I met with patient at the bedside for inpatient rehabilitation assessment.  Patient will benefit from ongoing PT, OT, and SLP, can actively participate in 3 hours of therapy a day 5 days of the week, and can make measurable gains during the admission.  Patient will also benefit from the coordinated team approach during an Inpatient Acute Rehabilitation admission.  The patient will receive intensive therapy as well as Rehabilitation physician, nursing, social worker, and care management interventions.  Due to bladder management, bowel management, safety, skin/wound care, disease management, medication administration, pain management, and patient education the patient requires 24 hour a day rehabilitation nursing.  The patient is currently *** with mobility and basic ADLs.  Discharge setting and therapy post discharge at home with home health is anticipated.  Patient has agreed to participate in the Acute Inpatient Rehabilitation Program and will admit {Time; today/tomorrow:10263}.  Preadmission Screen Completed By:  Alison Heron Lot RN MSN 07/05/2024 4:19  PM ______________________________________________________________________   Discussed status with Dr. PIERRETTE on *** at *** and received approval for admission today.  Admission Coordinator:  Alison Heron Lot, RN MSN time PIERRETTEPattricia ***   Assessment/Plan: Diagnosis: *** Does the need for close, 24 hr/day Medical supervision in concert with the patient's rehab needs make it unreasonable for this patient to be served in a less intensive setting? {yes_no_potentially:3041433} Co-Morbidities requiring supervision/potential complications: *** Due to {due un:6958565}, does the patient require 24 hr/day rehab nursing? {yes_no_potentially:3041433} Does the patient require coordinated care of a physician, rehab nurse, PT, OT, and SLP to address physical and functional deficits in the context of the above medical diagnosis(es)? {yes_no_potentially:3041433} Addressing deficits in the following areas: {deficits:3041436} Can the patient actively participate in an intensive therapy program of at least 3 hrs of therapy 5 days a week? {yes_no_potentially:3041433} The potential for patient to make measurable gains while on inpatient rehab is {potential:3041437} Anticipated functional outcomes upon discharge from inpatient rehab: {functional outcomes:304600100} PT, {functional outcomes:304600100} OT, {functional outcomes:304600100} SLP Estimated rehab length of stay to reach the above functional goals is: *** Anticipated discharge destination: {anticipated dc setting:21604} 10. Overall Rehab/Functional Prognosis: {potential:3041437}   MD Signature: ***

## 2024-07-05 NOTE — Progress Notes (Signed)
  Echocardiogram 2D Echocardiogram has been performed.  Koleen KANDICE Popper, RDCS 07/05/2024, 1:54 PM

## 2024-07-05 NOTE — Progress Notes (Signed)
 Mobility Specialist Progress Note:   07/05/24 1000  Mobility  Activity Ambulated with assistance in room  Level of Assistance Minimal assist, patient does 75% or more (+2 (safety))  Assistive Device Front wheel walker  Distance Ambulated (ft) 15 ft  Activity Response Tolerated well  Mobility Referral Yes  Mobility visit 1 Mobility  Mobility Specialist Start Time (ACUTE ONLY) 1000  Mobility Specialist Stop Time (ACUTE ONLY) 1015  Mobility Specialist Time Calculation (min) (ACUTE ONLY) 15 min   Pt bed alarm going off, attempting to get OOB alone. Requested to go to sink to wash up. Pt requiring +2 assist for safety with lines. Had x1 posterior LOB requiring minA. Pt left at sink with NT present. HR elevated to 152 with exertion.   Therisa Rana Mobility Specialist Please contact via SecureChat or  Rehab office at [phone]

## 2024-07-05 NOTE — Plan of Care (Signed)
   Problem: Health Behavior/Discharge Planning: Goal: Ability to manage health-related needs will improve Outcome: Progressing   Problem: Clinical Measurements: Goal: Ability to maintain clinical measurements within normal limits will improve Outcome: Progressing   Problem: Clinical Measurements: Goal: Will remain free from infection Outcome: Progressing

## 2024-07-05 NOTE — Progress Notes (Addendum)
 PT Cancellation Note  Patient Details Name: Andrew Brock MRN: [id number] DOB: 22-Mar-1961   Cancelled Treatment:    Reason Eval/Treat Not Completed: (P) Other (comment) (pt getting bath at sink/BSC with assist from nursing staff x2. Pt would benefit from OT consult placed as he typically lives alone and currently needing assist +2 for safety, RN notified.) Will continue efforts per PT plan of care later in the day as schedule permits.   [name] [name] 07/05/2024, 10:15 AM

## 2024-07-05 NOTE — Progress Notes (Signed)
   I was called to patient's bedside this evening to evaluate right thigh pain. Patient is currently on HF service, admitted with anterior STEMI that progressed to cardiogenic shock. He underwent LHC on 06/28/24 with Dr. Verlin. This evening, patient notified his nurse of pain in right quadriceps region. On my exam, patient reports that he has felt sharp pain and tenderness to touch in this same area intermittently over the past few days. Pain does improved with PRN analgesics but continues to recur. This even, pain felt to be more severe, 10 out of 10.  On evaluation, no obvious swelling, redness in area of discomfort.  Bilateral lower extremities roughly symmetrical in size.  Right lower extremity is warm to touch throughout with distal pulses intact.  Patient's area of tenderness is on antero-lateral aspect of right lower extremity approximately midway between hip and knee.  Skin is sensitive to touch, and patient reports discomfort with palpation.  Based on exam and nature of symptoms, my concern for vascular injury is low.  Suspect pain is neuropathic.  Of note, patient has received more regular dosing of analgesics over the past several days, but has not received anything since 5:44 this morning.  Given renal impairment/AKI this admission, would be hesitant to use agents such as gabapentin .  Will ask patient's nurse to give as needed oxycodone  along with Tylenol  and have them reassess pain in 1 hour.  Artist Pouch, PA-C

## 2024-07-05 NOTE — TOC Progression Note (Signed)
 Transition of Care [facility]) - Progression Note    Patient Details  Name: Andrew Brock MRN: [id number] Date of Birth: 08-25-1961  Transition of Care [facility]) CM/SW Contact  Luise JAYSON Pan, [place] Phone Number: 07/05/2024, 10:36 AM  Clinical Narrative:   CIR is following.  TOC will continue to follow.      Expected Discharge Plan: IP Rehab Facility Barriers to Discharge: Continued Medical Work up               Expected Discharge Plan and Services                                               Social Drivers of Health (SDOH) Interventions SDOH Screenings   Food Insecurity: Patient Unable To Answer (06/29/2024)  Housing: Patient Unable To Answer (06/29/2024)  Transportation Needs: Patient Unable To Answer (06/29/2024)  Utilities: Patient Unable To Answer (06/29/2024)  Social Connections: Unknown (04/25/2022)   Received from [facility]  Tobacco Use: Low Risk  (11/10/2023)    Readmission Risk Interventions     No data to display

## 2024-07-05 NOTE — Progress Notes (Signed)
  Inpatient Rehabilitation Admissions Coordinator   Met with patient at bedside for rehab assessment. We discussed goals and expectations of a possible CIR admit. He seemed distracted after speaking to his MD this am. States MD had explained what had happened to him since hospitalized and he was overwhelmed. Lives alone with limited assistance at home. Local brother could check in possible. Will need to be Mod I discharge directly home. I await further progress with therapy and further discussions with patient to determine his preference for rehab venue. Please call me with any questions.   Heron Leavell, RN, MSN Rehab Admissions Coordinator [phone]

## 2024-07-05 NOTE — Progress Notes (Signed)
 Patient complains of right thigh pain that radiated towards the top of his ankle. RN assess right femoral site, site is clean dry and intact. Patient states I never felt this pain before. Cardiology paged and to round.   1905: Verbal order to give oxycodone  and to reassess pain in one hour. Oncoming RN made aware. RN administered PO oxy. Patient made aware.

## 2024-07-05 NOTE — Progress Notes (Addendum)
 Advanced Heart Failure Rounding Note  Cardiologist: None  AHF Consulting MD: Dr. Zenaida Chief Complaint: Acute biventricular systolic heart failure Patient Profile   63 y.o. male with a PMH of non-Hodgkin's lymphoma who presents with anterior STEMI complicated by distal LAD perforation. Now with CGS.  Subjective:    7/18: LHC c/b LAD perforation>CGS 7/20: in AF RVR, converted to NSR on IV amio 7/21: worsening hemodynamics s/p pericardiocentesis w drain placement (~500cc total out) 7/22: Extubated.  7/23: Back in AF. Pericardial drain removed. Echo w/o evidence of reaccumulation  CO-OX 56% on 2.5 DBA with Fick CI of 2.4.  CVP not set up.  BP 120s/70s-80s  Having some right femoral pain at catheter insertion site. Denies dyspnea. Notes increased fatigue.  Objective:    Weight Range: (!) 140.5 kg Body mass index is 43.2 kg/m.   Vital Signs:   Temp:  [97.7 F (36.5 C)-98.7 F (37.1 C)] 97.7 F (36.5 C) (07/25 0550) Pulse Rate:  [70-196] 70 (07/25 0550) Resp:  [20-31] 20 (07/25 0550) BP: (103-137)/(68-90) 121/81 (07/25 0550) SpO2:  [90 %-96 %] 94 % (07/25 0550) Arterial Line BP: (129)/(72) 129/72 (07/24 0900) Weight:  [140.5 kg-141.7 kg] 140.5 kg (07/25 0550) Last BM Date : 07/04/24  Weight change: Filed Weights   07/04/24 0500 07/04/24 1720 07/05/24 0550  Weight: (!) 144.7 kg (!) 141.7 kg (!) 140.5 kg   Intake/Output:  Intake/Output Summary (Last 24 hours) at 07/05/2024 0824 Last data filed at 07/05/2024 0532 Gross per 24 hour  Intake 1062.07 ml  Output 3815 ml  Net -2752.93 ml    Physical Exam   General:  Fatigued appearing Cor: JVP 10-12. Irregular rhythm Lungs: clear Abdomen: obese, mildly distended Extremities: trace edema, extremities are cool   Telemetry   AF 70s-80s, ~ 5 PVCs/min  Medications:    Scheduled Medications:  aspirin   81 mg Oral Daily   atorvastatin   80 mg Oral Daily   Chlorhexidine  Gluconate Cloth  6 each Topical Daily    colchicine   0.6 mg Oral Daily   insulin  aspart  0-20 Units Subcutaneous TID WC   insulin  aspart  0-5 Units Subcutaneous QHS   insulin  glargine-yfgn  20 Units Subcutaneous BID   polyethylene glycol  17 g Oral BID   senna  2 tablet Oral QHS   sodium chloride  flush  3 mL Intravenous Q12H   spironolactone   25 mg Oral Daily    Infusions:  amiodarone  30 mg/hr (07/04/24 2140)   DOBUTamine  2.5 mcg/kg/min (07/04/24 1600)   piperacillin -tazobactam (ZOSYN )  IV 3.375 g (07/05/24 0532)    PRN Medications: acetaminophen , ondansetron  (ZOFRAN ) IV, mouth rinse, oxyCODONE , sodium chloride  flush, sodium chloride  flush  Assessment/Plan   Acute HFrEF with Cardiogenic shock: Biventricular, EF 20-25%, with mildly reduced RV. Suspect long standing given elevated PA pressures, cardiomyopathy out of proportion to ischemic disease. LA normal, however reduced CO/CI on PAC. S/P pericardiocentesis with drain placement with improvement of CO/CI. 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  - Continue DBA one more day while diuresing.  - Although Fick CI was okay, some concern about cardiac output. Notes increased fatigue and extremities are cool on exam. - Appears volume up. Give 80 mg lasix  IV BID + 500 mg diamox . Has been responding well to this regimen.  - Set up CVP monitoring. - continue spiro 25 mg daily - Start 25 mg  losartan daily - Start digoxin 0.125 mg daily - Does not appear to have significant social support at home and given obesity is not a heart transplant candidate, marginal LVAD candidate given RV failure  CAD with anterior STEMI: Distal LAD disease, potentially embolic with clear cut off sign. Case complicated by distal perforation. - Continue aspirin . Holding P2Y12 with perforation and pericardial effusion - Will eventually need anticoagulation, suspect this is an  embolic event  Pericardial effusion: Traumatic with recent perforation.  - Mild worsening on echo 7/20 some slight TV variation but no other evidence of tamponade - Echo 7/21 with increased circumferential effusion without signs of tamponade on echo, however with worsening hemodynamics - S/P pericardiocentesis with drain placement with improvement of CO/CI (250 cc bloody fluid drained in cath lab) - Drain removed 7/23; hgb stable; hemodynamics stable; no effusion on echo 7/23  Acute hypoxic respiratory failure:  - Due to pulmonary edema - Extubated 7/22 - concern for possible aspiration PNA with leukocytosis and procal 3 - leukocytosis, resolved - on Zosyn , complete on 7/25  AKI: Due to shock and cardiorenal syndrome.  - Cr stable, 1.4 today - Continue inotrope support and diuresis  AF/AFL: - new this admit with RVR - converted to NSR on IV amio; but went back into AF on inotrope support - Remains in rate controlled atrial flutter. Continue amiodarone  gtt at 30/hr while on dobutamine  - Will review timing of anticoagulation with Dr. Zenaida  ICU delirium - improving out of ICU but still appears mildly confused  Continue PT/OT. May be candidate for CIR once more stable.  Length of Stay: 7  Andrew Brock N, PA-C  07/05/2024, 8:24 AM  Advanced Heart Failure Team Pager [phone] (M-F; 7a - 5p)  Please contact [facility] for night-coverage after hours (5p -7a ) and weekends on [email or web address]

## 2024-07-06 DIAGNOSIS — I2102 ST elevation (STEMI) myocardial infarction involving left anterior descending coronary artery: Secondary | ICD-10-CM | POA: Diagnosis not present

## 2024-07-06 LAB — CBC
HCT: 36 % — ABNORMAL LOW (ref 39.0–52.0)
Hemoglobin: 11.9 g/dL — ABNORMAL LOW (ref 13.0–17.0)
MCH: 29.6 pg (ref 26.0–34.0)
MCHC: 33.1 g/dL (ref 30.0–36.0)
MCV: 89.6 fL (ref 80.0–100.0)
Platelets: 349 K/uL (ref 150–400)
RBC: 4.02 MIL/uL — ABNORMAL LOW (ref 4.22–5.81)
RDW: 14.8 % (ref 11.5–15.5)
WBC: 10.1 K/uL (ref 4.0–10.5)
nRBC: 0.5 % — ABNORMAL HIGH (ref 0.0–0.2)

## 2024-07-06 LAB — BASIC METABOLIC PANEL WITH GFR
Anion gap: 9 (ref 5–15)
BUN: 26 mg/dL — ABNORMAL HIGH (ref 6–20)
CO2: 21 mmol/L — ABNORMAL LOW (ref 22–32)
Calcium: 8.4 mg/dL — ABNORMAL LOW (ref 8.9–10.3)
Chloride: 105 mmol/L (ref 98–111)
Creatinine, Ser: 1.48 mg/dL — ABNORMAL HIGH (ref 0.61–1.24)
GFR, Estimated: 54 mL/min — ABNORMAL LOW (ref >60)
Glucose, Bld: 204 mg/dL — ABNORMAL HIGH (ref 70–99)
Potassium: 3.9 mmol/L (ref 3.5–5.1)
Sodium: 135 mmol/L (ref 135–145)

## 2024-07-06 LAB — COOXEMETRY PANEL
Carboxyhemoglobin: 2.4 % — ABNORMAL HIGH (ref 0.5–1.5)
Methemoglobin: <0.7 % (ref 0.0–1.5)
O2 Saturation: 59.8 %
Total hemoglobin: 11.4 g/dL — ABNORMAL LOW (ref 12.0–16.0)

## 2024-07-06 LAB — GLUCOSE, CAPILLARY
Glucose-Capillary: 135 mg/dL — ABNORMAL HIGH (ref 70–99)
Glucose-Capillary: 135 mg/dL — ABNORMAL HIGH (ref 70–99)
Glucose-Capillary: 134 mg/dL — ABNORMAL HIGH (ref 70–99)
Glucose-Capillary: 157 mg/dL — ABNORMAL HIGH (ref 70–99)

## 2024-07-06 MED ORDER — HEPARIN (PORCINE) 25000 UT/250ML-% IV SOLN
1600.0000 [IU]/h | INTRAVENOUS | Status: DC
  Administered 2024-07-06: 500 [IU]/h via INTRAVENOUS
  Administered 2024-07-07: 1400 [IU]/h via INTRAVENOUS
  Filled 2024-07-06 (×2): qty 250

## 2024-07-06 MED ORDER — FUROSEMIDE 10 MG/ML IJ SOLN
80.0000 mg | Freq: Two times a day (BID) | INTRAMUSCULAR | Status: AC
  Administered 2024-07-06 (×2): 80 mg via INTRAVENOUS
  Filled 2024-07-06 (×2): qty 8

## 2024-07-06 MED ORDER — ACETAZOLAMIDE 250 MG PO TABS
250.0000 mg | ORAL_TABLET | Freq: Once | ORAL | Status: AC
  Administered 2024-07-06: 250 mg via ORAL
  Filled 2024-07-06: qty 1

## 2024-07-06 MED ORDER — METOLAZONE 2.5 MG PO TABS
2.5000 mg | ORAL_TABLET | Freq: Once | ORAL | Status: AC
  Administered 2024-07-06: 2.5 mg via ORAL
  Filled 2024-07-06: qty 1

## 2024-07-06 NOTE — Progress Notes (Addendum)
 Physical Therapy Treatment Patient Details Name: Andrew Brock MRN: [id number] DOB: 03-03-1961 Today's Date: 07/06/2024   History of Present Illness 63 yo male presents to [facility] ED on 06/28/24 with chest pain activated as a STEMI. S/p 7/18 LHC/RHC. S/p 7/21 pericardiocentesis. Femoral line removed 7/24. PMH: morbid obesity, DM, NHL, and dyslipidemia    PT Comments  Pt slower to progress toward goals, today limited by not feeling well and lack of drive.  Emphasis on non assisted transition to EOB, STS from lower height guarded, but unassisted.  Completed 4 standing exercises and progressed to gait with the RW and CGA to min assist within turns.    If plan is discharge home, recommend the following: A lot of help with walking and/or transfers;Help with stairs or ramp for entrance;Assistance with cooking/housework;Assist for transportation   Can travel by private vehicle        Equipment Recommendations  Rolling walker (2 wheels);BSC/3in1    Recommendations for Other Services       Precautions / Restrictions Precautions Precautions: Fall Recall of Precautions/Restrictions: Intact Precaution/Restrictions Comments: PICC line     Mobility  Bed Mobility Overal bed mobility: Needs Assistance Bed Mobility: Supine to Sit, Sit to Supine     Supine to sit: Contact guard Sit to supine: Contact guard assist   General bed mobility comments: slow and mildly labored, but no assist needed.    Transfers Overall transfer level: Needs assistance Equipment used: Rolling walker (2 wheels) Transfers: Sit to/from Stand, Bed to chair/wheelchair/BSC Sit to Stand: Contact guard assist   Step pivot transfers: Contact guard assist       General transfer comment: cued for hand placement, struggled to stand from lower surface of the bed.    Ambulation/Gait Ambulation/Gait assistance: Min assist Gait Distance (Feet): 30 Feet Assistive device: Rolling walker (2 wheels) Gait Pattern/deviations:  Step-through pattern, Step-to pattern       General Gait Details: slow and guarded, generally safe use of the RW..   Stairs             Wheelchair Mobility     Tilt Bed    Modified Rankin (Stroke Patients Only)       Balance Overall balance assessment: Needs assistance   Sitting balance-Leahy Scale: Fair Sitting balance - Comments: EOB   Standing balance support: Bilateral upper extremity supported, During functional activity Standing balance-Leahy Scale: Poor Standing balance comment: reliant on AD presently                            Communication Communication Communication: No apparent difficulties  Cognition Arousal: Alert Behavior During Therapy: Flat affect   PT - Cognitive impairments: No family/caregiver present to determine baseline                         Following commands: Intact      Cueing Cueing Techniques: Verbal cues, Gestural cues  Exercises General Exercises - Lower Extremity Hip ABduction/ADduction: AROM, Strengthening, 10 reps, Standing Hip Flexion/Marching: AROM, Both, 10 reps, Standing Heel Raises: AROM, Both, Standing, 10 reps Mini-Sqauts: AROM, 10 reps, Standing    General Comments        Pertinent Vitals/Pain Pain Assessment Pain Assessment: No/denies pain Pain Intervention(s): Monitored during session    Home Living Family/patient expects to be discharged to:: Private residence Living Arrangements: Alone Available Help at Discharge: Family;Available PRN/intermittently Type of Home: Apartment Home Access: Level entry  Home Layout: One level Home Equipment: None      Prior Function            PT Goals (current goals can now be found in the care plan section) Acute Rehab PT Goals PT Goal Formulation: With patient Time For Goal Achievement: 07/18/24 Potential to Achieve Goals: Good Progress towards PT goals: Progressing toward goals    Frequency    Min 3X/week      PT  Plan      Co-evaluation              AM-PAC PT 6 Clicks Mobility   Outcome Measure  Help needed turning from your back to your side while in a flat bed without using bedrails?: A Little Help needed moving from lying on your back to sitting on the side of a flat bed without using bedrails?: A Little Help needed moving to and from a bed to a chair (including a wheelchair)?: A Little Help needed standing up from a chair using your arms (e.g., wheelchair or bedside chair)?: A Little Help needed to walk in hospital room?: A Lot Help needed climbing 3-5 steps with a railing? : Total 6 Click Score: 15    End of Session   Activity Tolerance: Patient tolerated treatment well Patient left: in bed;with call bell/phone within reach Nurse Communication: Mobility status PT Visit Diagnosis: Unsteadiness on feet (R26.81);Other abnormalities of gait and mobility (R26.89);Muscle weakness (generalized) (M62.81)     Time: 8668-8645 PT Time Calculation (min) (ACUTE ONLY): 23 min  Charges:    $Gait Training: 8-22 mins $Therapeutic Exercise: 8-22 mins PT General Charges $$ ACUTE PT VISIT: 1 Visit                     07/06/2024  [place] HERO., PT Acute Rehabilitation Services [phone]  (office)   Vinie GAILS Journi Moffa 07/06/2024, 2:31 PM

## 2024-07-06 NOTE — Plan of Care (Signed)
   Problem: Education: Goal: Understanding of CV disease, CV risk reduction, and recovery process will improve Outcome: Progressing

## 2024-07-06 NOTE — Evaluation (Signed)
 Occupational Therapy Evaluation Patient Details Name: Andrew Brock MRN: [id number] DOB: 02-19-1961 Today's Date: 07/06/2024   History of Present Illness   63 yo male presents to [facility] ED on 06/28/24 with chest pain activated as a STEMI. S/p 7/18 LHC/RHC. S/p 7/21 pericardiocentesis. Femoral line removed 7/24. PMH: morbid obesity, DM, NHL, and dyslipidemia     Clinical Impressions Pt currently at min assist level for functional transfers simulated to a regular toilet with use of the RW for support.  Mod assist overall for LB selfcare sit to stand.  Decreased endurance throughout with dyspnea 2/4 during mobility.  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  Feel he will benefit from acute care OT at this time in order to progress to a modified independent level with ADLs and transfers in order to return home alone.  Recommend  intensive inpatient follow-up therapy, >3 hours/day to reach this level post acute stay.        If plan is discharge home, recommend the following:   A little help with walking and/or transfers;A little help with bathing/dressing/bathroom;Assistance with cooking/housework;Help with stairs or ramp for entrance     Functional Status Assessment   Patient has had a recent decline in their functional status and demonstrates the ability to make significant improvements in function in a reasonable and predictable amount of time.     Equipment Recommendations   Tub/shower bench;BSC/3in1      Precautions/Restrictions   Precautions Precautions: Fall Restrictions Weight Bearing Restrictions Per Provider Order: No     Mobility Bed Mobility Overal bed mobility: Needs Assistance Bed Mobility: Sit to Supine       Sit to supine: Contact guard assist   General bed mobility comments: Min guard and increased time for transitioning LEs back in the bed and then straightening up his trunk    Transfers Overall transfer  level: Needs assistance Equipment used: Rolling walker (2 wheels), Ambulation equipment used Transfers: Sit to/from Stand, Bed to chair/wheelchair/BSC Sit to Stand: Contact guard assist     Step pivot transfers: Contact guard assist            Balance Overall balance assessment: Needs assistance Sitting-balance support: Feet supported, No upper extremity supported Sitting balance-Leahy Scale: Fair Sitting balance - Comments: EOB   Standing balance support: Bilateral upper extremity supported, During functional activity Standing balance-Leahy Scale: Poor Standing balance comment: Pt needs BUE RW support in standing                           ADL either performed or assessed with clinical judgement   ADL Overall ADL's : Needs assistance/impaired Eating/Feeding: Independent;Sitting   Grooming: Wash/dry face;Wash/dry hands;Contact guard assist;Standing   Upper Body Bathing: Set up;Sitting   Lower Body Bathing: Minimal assistance;Sit to/from stand   Upper Body Dressing : Supervision/safety;Sitting   Lower Body Dressing: Moderate assistance;Sit to/from stand   Toilet Transfer: Minimal assistance;Rolling walker (2 wheels);Regular Toilet;Grab bars   Toileting- Clothing Manipulation and Hygiene: Minimal assistance;Sit to/from stand       Functional mobility during ADLs: Rollator (4 wheels);Contact guard assist General ADL Comments: Pt with slight difficulty with sit to stand from recliner, requiring min guard.  UE support needed on RW for mobility secondary to weakness.  Pt with inability to reach either foot for dressing tasks secondary to weakness and decreased hip flexiblity.  This has improved some with reduction of fluid this admission but  still is not at baseline.  He will benefit from education on AE for LB dressing/bathing tasks.     Vision Baseline Vision/History: 1 Wears glasses Ability to See in Adequate Light: 0 Adequate Patient Visual Report: No change  from baseline Vision Assessment?: No apparent visual deficits     Perception Perception: Not tested       Praxis Praxis: Not tested       Pertinent Vitals/Pain Pain Assessment Pain Assessment: No/denies pain     Extremity/Trunk Assessment Upper Extremity Assessment Upper Extremity Assessment: Generalized weakness (left shoulder flexion 3-/5 right, right shoulder 3+/5.  Bilateral elbow flexion 4/5, elbow extension 3+/5)       Cervical / Trunk Assessment Cervical / Trunk Assessment: Normal   Communication     Cognition Arousal: Alert Behavior During Therapy: WFL for tasks assessed/performed                                 Following commands: Intact                  Home Living Family/patient expects to be discharged to:: Private residence Living Arrangements: Alone Available Help at Discharge: Family;Available PRN/intermittently Type of Home: Apartment Home Access: Level entry     Home Layout: One level     Bathroom Shower/Tub: Tub/shower unit;Curtain   Bathroom Toilet: Standard Bathroom Accessibility: No   Home Equipment: None          Prior Functioning/Environment Prior Level of Function : Independent/Modified Independent;Working/employed;Driving             Mobility 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    OT Problem List: Decreased strength;Impaired balance (sitting and/or standing);Decreased activity tolerance;Decreased knowledge of use of DME or AE   OT Treatment/Interventions: Self-care/ADL training;Patient/family education;Balance training;Therapeutic activities;DME and/or AE instruction;Therapeutic exercise      OT Goals(Current goals can be found in the care plan section)   Acute Rehab OT Goals Patient Stated Goal: Pt did not state but agreeable to participate in OT session. OT Goal Formulation: With patient Time For Goal Achievement: 07/20/24 Potential  to Achieve Goals: Good   OT Frequency:  Min 2X/week       AM-PAC OT 6 Clicks Daily Activity     Outcome Measure Help from another person eating meals?: None Help from another person taking care of personal grooming?: A Little Help from another person toileting, which includes using toliet, bedpan, or urinal?: A Little Help from another person bathing (including washing, rinsing, drying)?: A Little Help from another person to put on and taking off regular upper body clothing?: A Little Help from another person to put on and taking off regular lower body clothing?: A Little 6 Click Score: 19   End of Session Equipment Utilized During Treatment: Gait belt;Rolling walker (2 wheels) Nurse Communication: Mobility status  Activity Tolerance: Patient tolerated treatment well Patient left: in bed;with call bell/phone within reach  OT Visit Diagnosis: Unsteadiness on feet (R26.81);Other abnormalities of gait and mobility (R26.89);Muscle weakness (generalized) (M62.81)                Time: 8873-8793 OT Time Calculation (min): 40 min Charges:  OT General Charges $OT Visit: 1 Visit OT Evaluation $OT Eval Moderate Complexity: 1 Mod OT Treatments $Self Care/Home Management : 23-37 mins  Lynwood Constant, OTR/L Acute Rehabilitation Services  Office [phone] 07/06/2024

## 2024-07-06 NOTE — Progress Notes (Signed)
 Advanced Heart Failure Rounding Note  Cardiologist: None  AHF Consulting MD: Dr. Zenaida Chief Complaint: Acute biventricular systolic heart failure Patient Profile   63 y.o. male with a PMH of non-Hodgkin's lymphoma who presents with anterior STEMI complicated by distal LAD perforation. Now with CGS.  Subjective:    7/18: LHC c/b LAD perforation>CGS 7/20: in AF RVR, converted to NSR on IV amio 7/21: worsening hemodynamics s/p pericardiocentesis w drain placement (~500cc total out) 7/22: Extubated.  7/23: Back in AF. Pericardial drain removed. Echo w/o evidence of reaccumulation  - SBP 90s/60s - Mixed venous 60%  Objective:    Weight Range: (!) 136.7 kg Body mass index is 42.03 kg/m.   Vital Signs:   Temp:  [97.6 F (36.4 C)-98.2 F (36.8 C)] 97.8 F (36.6 C) (07/26 0905) Pulse Rate:  [72-94] 83 (07/26 0905) Resp:  [18-27] 18 (07/26 0905) BP: (92-131)/(66-87) 92/66 (07/26 0905) SpO2:  [93 %-97 %] 97 % (07/26 0905) Weight:  [136.7 kg] 136.7 kg (07/26 0648) Last BM Date : 07/04/24  Weight change: Filed Weights   07/04/24 1720 07/05/24 0550 07/06/24 0648  Weight: (!) 141.7 kg (!) 140.5 kg (!) 136.7 kg   Intake/Output:  Intake/Output Summary (Last 24 hours) at 07/06/2024 1056 Last data filed at 07/06/2024 0849 Gross per 24 hour  Intake 240 ml  Output 3650 ml  Net -3410 ml    Physical Exam   Cor: JVP 10; RRR Lungs: CTA Abdomen: obese, mildly distended Extremities: trace edema, extremities warm with 2+ edema to the mid shins   Telemetry   NSR 80s  Medications:    Scheduled Medications:  aspirin   81 mg Oral Daily   atorvastatin   80 mg Oral Daily   Chlorhexidine  Gluconate Cloth  6 each Topical Daily   colchicine   0.6 mg Oral Daily   digoxin   0.125 mg Oral Daily   insulin  aspart  0-20 Units Subcutaneous TID WC   insulin  aspart  0-5 Units Subcutaneous QHS   insulin  glargine-yfgn  20 Units Subcutaneous BID   losartan   25 mg Oral Daily   polyethylene  glycol  17 g Oral BID   senna  2 tablet Oral QHS   sodium chloride  flush  3 mL Intravenous Q12H   spironolactone   25 mg Oral Daily    Infusions:  amiodarone  30 mg/hr (07/05/24 2337)   DOBUTamine  2.5 mcg/kg/min (07/05/24 1038)    PRN Medications: acetaminophen , ondansetron  (ZOFRAN ) IV, mouth rinse, oxyCODONE , sodium chloride  flush, sodium chloride  flush  Assessment/Plan   Acute HFrEF with Cardiogenic shock: Biventricular, EF 20-25%, with mildly reduced RV. Suspect long standing given elevated PA pressures, cardiomyopathy out of proportion to ischemic disease. LA normal, however reduced CO/CI on PAC. S/P pericardiocentesis with drain placement with improvement of CO/CI. 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  - Continue DBA one more day while diuresing.  - Although Fick CI was okay, some concern about cardiac output. Notes increased fatigue and extremities are cool on exam. - sCr 1.48 from 1.38 w/ 3L urine output yesterday - CVP pending; JVP elevated with 2+ edema to the midshins. Continue IV lasix  80mg  BID and metolazone .  - continue digoxin  125mcg daily  - Continue losartan  25mg  daily & spiro 25mg  daily; uptitration limited by SBP.  - Dobutamine  2.5mcg/kg/min - Does not appear to have significant social support at home and given obesity is  not a heart transplant candidate, marginal LVAD candidate given RV failure  CAD with anterior STEMI: Distal LAD disease, potentially embolic with clear cut off sign. Case complicated by distal perforation. - Continue aspirin .  - started on hep gtt yesterday; likely embolic infarct.   -   Pericardial effusion: Traumatic with recent perforation.  - Mild worsening on echo 7/20 some slight TV variation but no other evidence of tamponade - Echo 7/21 with increased circumferential effusion without signs of tamponade on echo,  however with worsening hemodynamics - S/P pericardiocentesis with drain placement with improvement of CO/CI (250 cc bloody fluid drained in cath lab) - Drain removed 7/23; hgb stable; hemodynamics stable; no effusion on echo 7/23  Acute hypoxic respiratory failure:  - Due to pulmonary edema - Extubated 7/22 - concern for possible aspiration PNA with leukocytosis and procal 3 - leukocytosis, resolved - on Zosyn , complete on 7/25  AKI: Due to shock and cardiorenal syndrome.  - Cr stable, 1.4 today - Continue inotrope support and diuresis  AF/AFL: - new this admit with RVR - converted to NSR on IV amio - NSR today on amio  ICU delirium - improving out of ICU but still appears mildly confused  Continue PT/OT. May be candidate for CIR once more stable.  Length of Stay: 8  Arvine Clayburn, DO  07/06/2024, 10:56 AM  Advanced Heart Failure Team Pager [phone] (M-F; 7a - 5p)  Please contact [facility] for night-coverage after hours (5p -7a ) and weekends on [email or web address]

## 2024-07-06 NOTE — Progress Notes (Signed)
 PHARMACY - ANTICOAGULATION CONSULT NOTE  Pharmacy Consult for Heparin   Indication: atrial fibrillation / ACS  No Known Allergies  Patient Measurements: Height: 5' 11 (180.3 cm) Weight: (!) 136.7 kg (301 lb 5.9 oz) IBW/kg (Calculated) : 75.3 HEPARIN  DW (KG): 108.4  Vital Signs: Temp: 98.6 F (37 C) (07/26 1303) Temp Source: Axillary (07/26 1303) BP: 101/71 (07/26 1303) Pulse Rate: 87 (07/26 1303)  Labs: Recent Labs    07/04/24 0444 07/05/24 0530 07/06/24 0500  HGB 9.6* 10.4* 11.9*  HCT 30.0* 32.5* 36.0*  PLT 229 280 349  CREATININE 1.49* 1.38* 1.48*    Estimated Creatinine Clearance: 75 mL/min (A) (by C-G formula based on SCr of 1.48 mg/dL (H)).   Medical History: Past Medical History:  Diagnosis Date   Cancer (HCC)    hodgkin lymphoma 2005, treated in [place]   Diabetes mellitus (HCC)    Lateral meniscus tear    right knee   Plantar fasciitis      Assessment: Patient with ACS, Afib and pericardial effusion s/p drain  -  h/h stable no bleeding noted  Will cautiously begin low dose fix rate heparin  drip overnight If tolerates will slowly increase to goal   Goal of Therapy:  Heparin  level 0.3-0.7 units/ml Monitor platelets by anticoagulation protocol: Yes   Plan:  Heparin  drip low fixed dose 500 uts/hr  Follow up tolerates  Heparin  level and cbc in am    Olam Chalk Pharm.D. CPP, BCPS Clinical Pharmacist [phone] 07/06/2024 4:08 PM

## 2024-07-07 DIAGNOSIS — I2102 ST elevation (STEMI) myocardial infarction involving left anterior descending coronary artery: Secondary | ICD-10-CM | POA: Diagnosis not present

## 2024-07-07 LAB — BASIC METABOLIC PANEL WITH GFR
Anion gap: 12 (ref 5–15)
BUN: 27 mg/dL — ABNORMAL HIGH (ref 6–20)
CO2: 26 mmol/L (ref 22–32)
Calcium: 8.8 mg/dL — ABNORMAL LOW (ref 8.9–10.3)
Chloride: 99 mmol/L (ref 98–111)
Creatinine, Ser: 1.47 mg/dL — ABNORMAL HIGH (ref 0.61–1.24)
GFR, Estimated: 54 mL/min — ABNORMAL LOW (ref >60)
Glucose, Bld: 113 mg/dL — ABNORMAL HIGH (ref 70–99)
Potassium: 3.1 mmol/L — ABNORMAL LOW (ref 3.5–5.1)
Sodium: 137 mmol/L (ref 135–145)

## 2024-07-07 LAB — CBC
HCT: 34.2 % — ABNORMAL LOW (ref 39.0–52.0)
Hemoglobin: 11.2 g/dL — ABNORMAL LOW (ref 13.0–17.0)
MCH: 29.1 pg (ref 26.0–34.0)
MCHC: 32.7 g/dL (ref 30.0–36.0)
MCV: 88.8 fL (ref 80.0–100.0)
Platelets: 370 K/uL (ref 150–400)
RBC: 3.85 MIL/uL — ABNORMAL LOW (ref 4.22–5.81)
RDW: 14.7 % (ref 11.5–15.5)
WBC: 10.3 K/uL (ref 4.0–10.5)
nRBC: 0 % (ref 0.0–0.2)

## 2024-07-07 LAB — GLUCOSE, CAPILLARY
Glucose-Capillary: 118 mg/dL — ABNORMAL HIGH (ref 70–99)
Glucose-Capillary: 157 mg/dL — ABNORMAL HIGH (ref 70–99)
Glucose-Capillary: 162 mg/dL — ABNORMAL HIGH (ref 70–99)
Glucose-Capillary: 173 mg/dL — ABNORMAL HIGH (ref 70–99)

## 2024-07-07 LAB — COOXEMETRY PANEL
Carboxyhemoglobin: 1.8 % — ABNORMAL HIGH (ref 0.5–1.5)
Methemoglobin: <0.7 % (ref 0.0–1.5)
O2 Saturation: 61.2 %
Total hemoglobin: 13.9 g/dL (ref 12.0–16.0)

## 2024-07-07 LAB — HEPARIN LEVEL (UNFRACTIONATED)
Heparin Unfractionated: <0.1 [IU]/mL — ABNORMAL LOW (ref 0.30–0.70)
Heparin Unfractionated: 0.19 [IU]/mL — ABNORMAL LOW (ref 0.30–0.70)

## 2024-07-07 MED ORDER — FUROSEMIDE 10 MG/ML IJ SOLN
80.0000 mg | Freq: Two times a day (BID) | INTRAMUSCULAR | Status: AC
  Administered 2024-07-07 (×2): 80 mg via INTRAVENOUS
  Filled 2024-07-07 (×2): qty 8

## 2024-07-07 MED ORDER — METOLAZONE 2.5 MG PO TABS
2.5000 mg | ORAL_TABLET | Freq: Once | ORAL | Status: AC
  Administered 2024-07-07: 2.5 mg via ORAL
  Filled 2024-07-07: qty 1

## 2024-07-07 MED ORDER — ACETAZOLAMIDE 250 MG PO TABS
250.0000 mg | ORAL_TABLET | Freq: Once | ORAL | Status: AC
  Administered 2024-07-07: 250 mg via ORAL
  Filled 2024-07-07: qty 1

## 2024-07-07 MED ORDER — POTASSIUM CHLORIDE CRYS ER 20 MEQ PO TBCR
60.0000 meq | EXTENDED_RELEASE_TABLET | Freq: Once | ORAL | Status: AC
  Administered 2024-07-07: 60 meq via ORAL
  Filled 2024-07-07: qty 3

## 2024-07-07 NOTE — Progress Notes (Addendum)
 PHARMACY - ANTICOAGULATION CONSULT NOTE  Pharmacy Consult for Heparin   Indication: atrial fibrillation / ACS  No Known Allergies  Patient Measurements: Height: 5' 11 (180.3 cm) Weight: 135.1 kg (297 lb 13.5 oz) IBW/kg (Calculated) : 75.3 HEPARIN  DW (KG): 108.4  Vital Signs: Temp: 97.9 F (36.6 C) (07/27 0814) Temp Source: Oral (07/27 0814) BP: 101/69 (07/27 0814) Pulse Rate: 83 (07/27 0814)  Labs: Recent Labs    07/05/24 0530 07/06/24 0500 07/07/24 0520  HGB 10.4* 11.9* 11.2*  HCT 32.5* 36.0* 34.2*  PLT 280 349 370  HEPARINUNFRC  --   --  <0.10*  CREATININE 1.38* 1.48* 1.47*    Estimated Creatinine Clearance: 75 mL/min (A) (by C-G formula based on SCr of 1.47 mg/dL (H)).   Medical History: Past Medical History:  Diagnosis Date   Cancer (HCC)    hodgkin lymphoma 2005, treated in [place]   Diabetes mellitus (HCC)    Lateral meniscus tear    right knee   Plantar fasciitis      Assessment: Patient with ACS, Afib and pericardial effusion s/p drain  -  H/H stable no bleeding noted.  Cautiously started on low dose fix rate heparin  drip 7/26 PM. If tolerates will slowly increase to goal   07/27 AM update: Heparin  level undetectable as expected given low rate of heparin  infusion. Per Dr. Gwynne, we can now target therapeutic Anti-Xa levels; therefore will increase heparin  rate today. Hgb, Hct, Plts stable.   Goal of Therapy:  Heparin  level 0.3-0.7 units/ml Monitor platelets by anticoagulation protocol: Yes   Plan:  Increase Heparin  rate to 1200 units/hr Heparin  level in 6 hours Daily heparin  level and CBC  Monitor for s/sx of bleeding  R. Samual Satterfield, PharmD PGY-1 Acute Care Pharmacy Resident [facility] 07/07/2024 9:05 AM

## 2024-07-07 NOTE — Progress Notes (Signed)
 Mobility Specialist Progress Note:    07/07/24 1248  Mobility  Activity Transferred from chair to bed  Level of Assistance Contact guard assist, steadying assist  Assistive Device Other (Comment) (HHA)  Distance Ambulated (ft) 3 ft  Activity Response Tolerated well  Mobility Referral Yes  Mobility visit 1 Mobility  Mobility Specialist Start Time (ACUTE ONLY) 1035  Mobility Specialist Stop Time (ACUTE ONLY) 1045  Mobility Specialist Time Calculation (min) (ACUTE ONLY) 10 min   Pt afraid of standing but agreeable to session. Pt did better than expect standing w/ HHA, taking fwd/bkw/ and lateral steps to the [facility] and controlled sitting. C/o still feeling slightly weak but getting better. Left pt in bed in room w/ RN and all needs met.   Venetia Keel Mobility Specialist Please Neurosurgeon or Rehab Office at [phone]

## 2024-07-07 NOTE — Plan of Care (Signed)
  Problem: Education: Goal: Knowledge of General Education information will improve Description: Including pain rating scale, medication(s)/side effects and non-pharmacologic comfort measures Outcome: Progressing   Problem: Health Behavior/Discharge Planning: Goal: Ability to manage health-related needs will improve Outcome: Progressing   Problem: Clinical Measurements: Goal: Ability to maintain clinical measurements within normal limits will improve Outcome: Progressing Goal: Will remain free from infection Outcome: Progressing Goal: Diagnostic test results will improve Outcome: Progressing Goal: Respiratory complications will improve Outcome: Progressing Goal: Cardiovascular complication will be avoided Outcome: Progressing   Problem: Activity: Goal: Risk for activity intolerance will decrease Outcome: Progressing   Problem: Nutrition: Goal: Adequate nutrition will be maintained Outcome: Progressing   Problem: Coping: Goal: Level of anxiety will decrease Outcome: Progressing   Problem: Elimination: Goal: Will not experience complications related to bowel motility Outcome: Progressing Goal: Will not experience complications related to urinary retention Outcome: Progressing   Problem: Pain Managment: Goal: General experience of comfort will improve and/or be controlled Outcome: Progressing   Problem: Safety: Goal: Ability to remain free from injury will improve Outcome: Progressing   Problem: Skin Integrity: Goal: Risk for impaired skin integrity will decrease Outcome: Progressing   Problem: Education: Goal: Understanding of CV disease, CV risk reduction, and recovery process will improve Outcome: Progressing Goal: Individualized Educational Video(s) Outcome: Progressing   Problem: Activity: Goal: Ability to return to baseline activity level will improve Outcome: Progressing   Problem: Cardiovascular: Goal: Ability to achieve and maintain adequate  cardiovascular perfusion will improve Outcome: Progressing Goal: Vascular access site(s) Level 0-1 will be maintained Outcome: Progressing   Problem: Health Behavior/Discharge Planning: Goal: Ability to safely manage health-related needs after discharge will improve Outcome: Progressing   Problem: Education: Goal: Ability to describe self-care measures that may prevent or decrease complications (Diabetes Survival Skills Education) will improve Outcome: Progressing Goal: Individualized Educational Video(s) Outcome: Progressing   Problem: Coping: Goal: Ability to adjust to condition or change in health will improve Outcome: Progressing   Problem: Fluid Volume: Goal: Ability to maintain a balanced intake and output will improve Outcome: Progressing   Problem: Health Behavior/Discharge Planning: Goal: Ability to identify and utilize available resources and services will improve Outcome: Progressing Goal: Ability to manage health-related needs will improve Outcome: Progressing   Problem: Metabolic: Goal: Ability to maintain appropriate glucose levels will improve Outcome: Progressing   Problem: Nutritional: Goal: Maintenance of adequate nutrition will improve Outcome: Progressing Goal: Progress toward achieving an optimal weight will improve Outcome: Progressing   Problem: Skin Integrity: Goal: Risk for impaired skin integrity will decrease Outcome: Progressing   Problem: Tissue Perfusion: Goal: Adequacy of tissue perfusion will improve Outcome: Progressing   Problem: Activity: Goal: Ability to tolerate increased activity will improve Outcome: Progressing   Problem: Respiratory: Goal: Ability to maintain a clear airway and adequate ventilation will improve Outcome: Progressing   Problem: Role Relationship: Goal: Method of communication will improve Outcome: Progressing

## 2024-07-07 NOTE — Progress Notes (Signed)
 Advanced Heart Failure Rounding Note  Cardiologist: None  AHF Consulting MD: Dr. Zenaida Chief Complaint: Acute biventricular systolic heart failure Patient Profile   63 y.o. male with a PMH of non-Hodgkin's lymphoma who presents with anterior STEMI complicated by distal LAD perforation. Now with CGS.  Subjective:    7/18: LHC c/b LAD perforation>CGS 7/20: in AF RVR, converted to NSR on IV amio 7/21: worsening hemodynamics s/p pericardiocentesis w drain placement (~500cc total out) 7/22: Extubated.  7/23: Back in AF. Pericardial drain removed. Echo w/o evidence of reaccumulation  - 3.4L urine output yesterday  - Reports that he feels much better.   Objective:    Weight Range: 135.1 kg Body mass index is 41.54 kg/m.   Vital Signs:   Temp:  [97.8 F (36.6 C)-98.6 F (37 C)] 97.9 F (36.6 C) (07/27 0814) Pulse Rate:  [80-87] 83 (07/27 0814) Resp:  [17-22] 20 (07/27 0814) BP: (101-123)/(66-87) 101/69 (07/27 0814) SpO2:  [93 %-97 %] 95 % (07/27 0814) Weight:  [135.1 kg] 135.1 kg (07/27 0500) Last BM Date : 07/07/24  Weight change: Filed Weights   07/05/24 0550 07/06/24 0648 07/07/24 0500  Weight: (!) 140.5 kg (!) 136.7 kg 135.1 kg   Intake/Output:  Intake/Output Summary (Last 24 hours) at 07/07/2024 1025 Last data filed at 07/07/2024 0811 Gross per 24 hour  Intake 1322.85 ml  Output 3275 ml  Net -1952.15 ml    Physical Exam   Cor: JVP 9, RRR Lungs: normal work of breathing Abdomen: obese, mildly distended Extremities: trace edema, extremities warm with 1+ edema to the mid shins   Telemetry   NSR 70s-80s  Medications:    Scheduled Medications:  aspirin   81 mg Oral Daily   atorvastatin   80 mg Oral Daily   Chlorhexidine  Gluconate Cloth  6 each Topical Daily   colchicine   0.6 mg Oral Daily   digoxin   0.125 mg Oral Daily   insulin  aspart  0-20 Units Subcutaneous TID WC   insulin  aspart  0-5 Units Subcutaneous QHS   insulin  glargine-yfgn  20 Units  Subcutaneous BID   losartan   25 mg Oral Daily   polyethylene glycol  17 g Oral BID   senna  2 tablet Oral QHS   sodium chloride  flush  3 mL Intravenous Q12H   spironolactone   25 mg Oral Daily    Infusions:  amiodarone  30 mg/hr (07/07/24 0310)   DOBUTamine  2.5 mcg/kg/min (07/07/24 0310)   heparin  500 Units/hr (07/07/24 0310)    PRN Medications: acetaminophen , ondansetron  (ZOFRAN ) IV, mouth rinse, oxyCODONE , sodium chloride  flush, sodium chloride  flush  Assessment/Plan   Acute HFrEF with Cardiogenic shock: Biventricular, EF 20-25%, with mildly reduced RV. Suspect long standing given elevated PA pressures, cardiomyopathy out of proportion to ischemic disease. LA normal, however reduced CO/CI on PAC. S/P pericardiocentesis with drain placement with improvement of CO/CI. 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  - Continue IV lasix  80mg  BID + metolazone  2.5mg  daily; can transition to torsemide  tomorrow.  - D/C dobutamine . Repeat COOX tomorrow.  - continue digoxin , losartan  25mg  daily, spiro 25mg ; uptitration limited by SBP.  - Discussed with pharmD: hep gtt not therapeutic yet. Will increase to therapeutic dosing. Plan to transition to DOAC tomorrow. Can repeat limited TTE to reassess for effusion although I feel risk is fairly low  at this point.  - Does not appear to have significant social support at home and given obesity is not a heart transplant candidate, marginal LVAD candidate given RV failure  CAD with anterior STEMI: Distal LAD disease, potentially embolic with clear cut off sign. Case complicated by distal perforation. - Continue aspirin .  - see above.   Pericardial effusion: Traumatic with recent perforation.  - Mild worsening on echo 7/20 some slight TV variation but no  other evidence of tamponade - Echo 7/21 with increased circumferential effusion without signs of tamponade on echo, however with worsening hemodynamics - S/P pericardiocentesis with drain placement with improvement of CO/CI (250 cc bloody fluid drained in cath lab) - Drain removed 7/23; hgb stable; hemodynamics stable; no effusion on echo 7/23 - see above; hep gtt to therapeutic today. Start DOAC tomorrow.   Acute hypoxic respiratory failure:  - Due to pulmonary edema - Extubated 7/22 - concern for possible aspiration PNA with leukocytosis and procal 3 - leukocytosis, resolved - on Zosyn , complete on 7/25  AKI: Due to shock and cardiorenal syndrome.  - Cr stable, 1.4 today - Continue inotrope support and diuresis  AF/AFL: - new this admit with RVR - NSR today - Discontinue dobutamine  today; transition to PO amio tomorrow.   ICU delirium - improving out of ICU but still appears mildly confused  Continue PT/OT. May be candidate for CIR once more stable.  Length of Stay: 9  Talisa Petrak, DO  07/07/2024, 10:25 AM  Advanced Heart Failure Team Pager [phone] (M-F; 7a - 5p)  Please contact [facility] for night-coverage after hours (5p -7a ) and weekends on [email or web address]

## 2024-07-07 NOTE — Progress Notes (Signed)
 PHARMACY - ANTICOAGULATION CONSULT NOTE  Pharmacy Consult for Heparin  Indication: atrial fibrillation and ACS  No Known Allergies  Patient Measurements: Height: 5' 11 (180.3 cm) Weight: 135.1 kg (297 lb 13.5 oz) IBW/kg (Calculated) : 75.3 HEPARIN  DW (KG): 108.4  Vital Signs: Temp: 98.5 F (36.9 C) (07/27 1928) Temp Source: Oral (07/27 1928) BP: 116/85 (07/27 1928) Pulse Rate: 83 (07/27 1928)  Labs: Recent Labs    07/05/24 0530 07/06/24 0500 07/07/24 0520 07/07/24 1600  HGB 10.4* 11.9* 11.2*  --   HCT 32.5* 36.0* 34.2*  --   PLT 280 349 370  --   HEPARINUNFRC  --   --  <0.10* 0.19*  CREATININE 1.38* 1.48* 1.47*  --     Estimated Creatinine Clearance: 75 mL/min (A) (by C-G formula based on SCr of 1.47 mg/dL (H)).  Assessment: Patient with ACS, Afib and pericardial effusion s/p drain  -  H/H stable no bleeding noted.  Cautiously started on low dose fix rate heparin  drip 7/26 PM. If tolerates will slowly increase to goal    07/27 AM update: Heparin  level undetectable as expected given low rate of heparin  infusion. Per Dr. Gwynne, we can now target therapeutic Anti-Xa levels; therefore will increase heparin  rate today. Hgb, Hct, Plts stable.   7/27 pm: Heparin  level subtherapeutic (0.19) on 1200 units/hr.   Goal of Therapy:  Heparin  level 0.3-0.7 units/ml Monitor platelets by anticoagulation protocol: Yes   Plan:  Increase heparin  drip to 1400 units/hr Next heparin  level and CBC with morning labs. Monitor for signs/symptoms of bleeding. Noted plan to begin DOAC on 7/28.  Genaro Zebedee Calin, RPh 07/07/2024,8:02 PM

## 2024-07-08 ENCOUNTER — Telehealth (HOSPITAL_COMMUNITY): Payer: Self-pay | Admitting: Pharmacy Technician

## 2024-07-08 ENCOUNTER — Other Ambulatory Visit (HOSPITAL_COMMUNITY): Payer: Self-pay

## 2024-07-08 LAB — CBC
HCT: 36.9 % — ABNORMAL LOW (ref 39.0–52.0)
Hemoglobin: 12.2 g/dL — ABNORMAL LOW (ref 13.0–17.0)
MCH: 29.1 pg (ref 26.0–34.0)
MCHC: 33.1 g/dL (ref 30.0–36.0)
MCV: 88.1 fL (ref 80.0–100.0)
Platelets: 448 K/uL — ABNORMAL HIGH (ref 150–400)
RBC: 4.19 MIL/uL — ABNORMAL LOW (ref 4.22–5.81)
RDW: 14.5 % (ref 11.5–15.5)
WBC: 12.2 K/uL — ABNORMAL HIGH (ref 4.0–10.5)
nRBC: 0 % (ref 0.0–0.2)

## 2024-07-08 LAB — BASIC METABOLIC PANEL WITH GFR
Anion gap: 9 (ref 5–15)
BUN: 30 mg/dL — ABNORMAL HIGH (ref 6–20)
CO2: 27 mmol/L (ref 22–32)
Calcium: 8.8 mg/dL — ABNORMAL LOW (ref 8.9–10.3)
Chloride: 100 mmol/L (ref 98–111)
Creatinine, Ser: 1.54 mg/dL — ABNORMAL HIGH (ref 0.61–1.24)
GFR, Estimated: 51 mL/min — ABNORMAL LOW (ref >60)
Glucose, Bld: 123 mg/dL — ABNORMAL HIGH (ref 70–99)
Potassium: 3.4 mmol/L — ABNORMAL LOW (ref 3.5–5.1)
Sodium: 136 mmol/L (ref 135–145)

## 2024-07-08 LAB — GLUCOSE, CAPILLARY
Glucose-Capillary: 119 mg/dL — ABNORMAL HIGH (ref 70–99)
Glucose-Capillary: 139 mg/dL — ABNORMAL HIGH (ref 70–99)

## 2024-07-08 LAB — COOXEMETRY PANEL
Carboxyhemoglobin: 1.4 % (ref 0.5–1.5)
Methemoglobin: <0.7 % (ref 0.0–1.5)
O2 Saturation: 55.7 %
Total hemoglobin: 12.8 g/dL (ref 12.0–16.0)

## 2024-07-08 LAB — MAGNESIUM: Magnesium: 2.2 mg/dL (ref 1.7–2.4)

## 2024-07-08 LAB — HEPARIN LEVEL (UNFRACTIONATED): Heparin Unfractionated: <0.1 [IU]/mL — ABNORMAL LOW (ref 0.30–0.70)

## 2024-07-08 MED ORDER — AMIODARONE HCL 200 MG PO TABS
200.0000 mg | ORAL_TABLET | Freq: Two times a day (BID) | ORAL | Status: DC
  Administered 2024-07-08: 200 mg via ORAL
  Filled 2024-07-08: qty 1

## 2024-07-08 MED ORDER — SPIRONOLACTONE 25 MG PO TABS
25.0000 mg | ORAL_TABLET | Freq: Every day | ORAL | 6 refills | Status: AC
  Filled 2024-07-08: qty 30, 30d supply, fill #0
  Filled 2024-08-03: qty 30, 30d supply, fill #1

## 2024-07-08 MED ORDER — EMPAGLIFLOZIN 10 MG PO TABS
10.0000 mg | ORAL_TABLET | Freq: Every day | ORAL | 6 refills | Status: AC
  Filled 2024-07-08: qty 30, 30d supply, fill #0
  Filled 2024-08-03: qty 30, 30d supply, fill #1

## 2024-07-08 MED ORDER — EMPAGLIFLOZIN 10 MG PO TABS
10.0000 mg | ORAL_TABLET | Freq: Every day | ORAL | Status: DC
  Administered 2024-07-08: 10 mg via ORAL
  Filled 2024-07-08: qty 1

## 2024-07-08 MED ORDER — DIGOXIN 125 MCG PO TABS
0.1250 mg | ORAL_TABLET | Freq: Every day | ORAL | 6 refills | Status: DC
  Filled 2024-07-08: qty 30, 30d supply, fill #0
  Filled 2024-08-03: qty 30, 30d supply, fill #1

## 2024-07-08 MED ORDER — AMIODARONE HCL 200 MG PO TABS
ORAL_TABLET | ORAL | 6 refills | Status: DC
  Filled 2024-07-08: qty 42, 30d supply, fill #0
  Filled 2024-08-03: qty 42, 30d supply, fill #1

## 2024-07-08 MED ORDER — TORSEMIDE 20 MG PO TABS
20.0000 mg | ORAL_TABLET | Freq: Every day | ORAL | 6 refills | Status: DC
Start: 2024-07-09 — End: 2024-10-03
  Filled 2024-07-08: qty 30, 30d supply, fill #0
  Filled 2024-08-03: qty 30, 30d supply, fill #1

## 2024-07-08 MED ORDER — POTASSIUM CHLORIDE CRYS ER 20 MEQ PO TBCR
60.0000 meq | EXTENDED_RELEASE_TABLET | Freq: Once | ORAL | Status: AC
  Administered 2024-07-08: 60 meq via ORAL
  Filled 2024-07-08: qty 3

## 2024-07-08 MED ORDER — APIXABAN 5 MG PO TABS
5.0000 mg | ORAL_TABLET | Freq: Two times a day (BID) | ORAL | Status: DC
  Administered 2024-07-08: 5 mg via ORAL
  Filled 2024-07-08: qty 1

## 2024-07-08 MED ORDER — APIXABAN 5 MG PO TABS
5.0000 mg | ORAL_TABLET | Freq: Two times a day (BID) | ORAL | 6 refills | Status: AC
  Filled 2024-07-08: qty 60, 30d supply, fill #0
  Filled 2024-08-03: qty 60, 30d supply, fill #1

## 2024-07-08 MED ORDER — COLCHICINE 0.6 MG PO TABS
0.6000 mg | ORAL_TABLET | Freq: Every day | ORAL | 3 refills | Status: DC
  Filled 2024-07-08: qty 30, 30d supply, fill #0
  Filled 2024-08-03: qty 30, 30d supply, fill #1

## 2024-07-08 MED ORDER — TORSEMIDE 20 MG PO TABS
40.0000 mg | ORAL_TABLET | Freq: Every day | ORAL | Status: DC
  Administered 2024-07-08: 40 mg via ORAL
  Filled 2024-07-08: qty 2

## 2024-07-08 NOTE — Discharge Summary (Addendum)
 Advanced Heart Failure Team  Discharge Summary   Patient ID: Andrew Brock MRN: [id number], DOB/AGE: [birth year] 63 y.o. Admit date: 06/28/2024 D/C date:     07/08/2024   Primary Discharge Diagnoses:  Cardiogenic Shock Acute HFrEF STEMI CAD Pericardial effusion Tamponade  Secondary Discharge Diagnoses:  AKI Atrial Fibrillation/Tachycardia with RVR Acute hypoxic respiratory failure  Hospital Course:   63 y.o. male with a PMH of non-Hodgkin's lymphoma who presents with anterior STEMI complicated by distal LAD perforation. Now with CGS.   Presented to [facility] 06/28/24 with Code STEMI by EMS. Emergently taken to cath lab, LHC showed LAD disease. PCI complicated by LAD perforation s/p balloon tamponade. He was intubated in cath lab. Developed cardiogenic shock and subsequent AKI and was started on dobutamine  and PAC placed. He was admitted to ICU for further management. Serial echos performed for surveillance. EF newly reduced to 20-25% with severely reduced RV function. Initially echo with moderate pericardial effusion. He then developed AF RVR s/p chemical conversion with IV amio, now on PO amiodarone . Anticoagulation held. Hemodynamics continued to worsen with low PAPi. Underwent pericardiocentesis and drain placement on 7/21 with total 500cc removed. Hemodynamics improved. Drain discontinued 7/23 without evidence of re-accumulation on repeat echos. Started on heparin  and transitioned to Eliquis  at time of discharge. He was diuresed with IV Lasix , dobutamine  weaned off, and started on GDMT.   Seen today by Dr. Cherrie and deemed appropriate for discharge with close AHF follow up.  Hospital Course by Problem List:  Acute HFrEF with Cardiogenic shock (shock resolved): Biventricular, EF 20-25%, with mildly reduced RV. Suspect long standing given elevated PA pressures, cardiomyopathy out of proportion to ischemic disease.  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 - Coox stable off dobutamine  - continue Torsemide  40 mg daily - continue digoxin  0.125 mg daily - continue spiro 25mg  daily - does not appear to have significant social support at home and given obesity is not a heart transplant candidate, marginal LVAD candidate given RV failure   CAD with anterior STEMI:  - Distal LAD disease, potentially embolic with clear cut off sign. Case complicated by distal perforation. - Continue Eliquis  + ASA   Pericardial effusion:  - due to LAD perforation s/p balloon tamponade  - Echo 7/20 mild worsening effusion on but no other evidence of tamponade - Echo 7/21 with increased circumferential effusion without signs of tamponade. Worsening hemodynamics - S/P pericardiocentesis with drain placement with improvement of CO/CI. 500cc total out - Drain removed 7/23; hemodynamics stable; no effusion on echo 7/23 or 7/25. - continue Eliquis  5 mg bid - continue colchicine  0.6 mg daily   Acute hypoxic respiratory failure:  - resolved.    AKI:  - Due to shock and cardiorenal syndrome.  - Cr stable, 1.54   AF/AFL: - new this admit with RVR - Remains in NSR - continue Eliquis  5 mg bid - continue PO amio 200 mg bid for 2 weeks, then 200 mg daily  Discharge Weight: 288 lb Discharge Vitals: Blood pressure 106/75, pulse 97, temperature 97.8 F (36.6 C), temperature source Oral, resp. rate (!) 26, height 5' 11 (1.803 m), weight 131 kg, SpO2 92%.  Labs: Lab Results  Component Value Date   WBC 12.2 (H) 07/08/2024   HGB 12.2 (L) 07/08/2024   HCT 36.9 (L) 07/08/2024   MCV  88.1 07/08/2024   PLT 448 (H) 07/08/2024    Recent Labs  Lab 07/08/24 0500  NA 136  K 3.4*  CL 100  CO2 27  BUN 30*  CREATININE 1.54*  CALCIUM  8.8*  GLUCOSE 123*   Lab Results  Component Value Date   CHOL  109 06/28/2024   HDL 44 06/28/2024   LDLCALC 46 06/28/2024   TRIG 101 07/02/2024   BNP (last 3 results) No results for input(s): BNP in the last 8760 hours.  ProBNP (last 3 results) No results for input(s): PROBNP in the last 8760 hours.   Diagnostic Studies/Procedures   No results found.  Discharge Medications   Allergies as of 07/08/2024   No Known Allergies      Medication List     STOP taking these medications    diclofenac Sodium 1 % Gel Commonly known as: VOLTAREN   sitaGLIPtin 100 MG tablet Commonly known as: JANUVIA       TAKE these medications    albuterol 108 (90 Base) MCG/ACT inhaler Commonly known as: VENTOLIN HFA Inhale 1 puff into the lungs every 4 (four) hours as needed for shortness of breath or wheezing.   amiodarone  200 MG tablet Commonly known as: PACERONE  Take 200 mg twice daily for 14 days (until 8/11), than taken 200 mg once daily   apixaban  5 MG Tabs tablet Commonly known as: ELIQUIS  Take 1 tablet (5 mg total) by mouth 2 (two) times daily.   aspirin  81 MG tablet Take 1 tablet (81 mg total) by mouth daily.   atorvastatin  80 MG tablet Commonly known as: LIPITOR  Take 80 mg by mouth daily.   colchicine  0.6 MG tablet Take 1 tablet (0.6 mg total) by mouth daily. Start taking on: July 09, 2024   digoxin  0.125 MG tablet Commonly known as: LANOXIN  Take 1 tablet (0.125 mg total) by mouth daily. Start taking on: July 09, 2024   empagliflozin  10 MG Tabs tablet Commonly known as: JARDIANCE  Take 1 tablet (10 mg total) by mouth daily.   insulin  glargine 100 UNIT/ML injection Commonly known as: LANTUS  Inject 34 Units into the skin daily.   metFORMIN 1000 MG tablet Commonly known as: GLUCOPHAGE Take 1,000 mg by mouth 2 (two) times daily with a meal.   spironolactone  25 MG tablet Commonly known as: ALDACTONE  Take 1 tablet (25 mg total) by mouth daily. Start taking on: July 09, 2024   torsemide  20 MG tablet Commonly known as:  DEMADEX  Take 1 tablet (20 mg total) by mouth daily. Start taking on: July 09, 2024        Disposition   The patient will be discharged in stable condition to home.   Follow-up Information     [facility] Follow up on 07/16/2024.   Specialty: Cardiology Why: At 10 am [facility], Entrance C Free Valet Parking Available Contact information: [address] [place]  [address] [phone]                 Duration of Discharge Encounter:   [place] Lee, NP 07/08/2024, 12:53 PM  Patient seen and examined with the above-signed Advanced Practice Provider and/or Housestaff. I personally reviewed laboratory data, imaging studies and relevant notes. I independently examined the patient and formulated the important aspects of the plan. I have edited the note to reflect any of my changes or salient points. I have personally discussed the plan with the patient and/or family.   Feels  good today. Anxious to go home. Remains in NSR. Co-ox stable off DBA    General:  Sitting up in chair. No resp difficulty HEENT: normal Neck: supple. JVP 7-8  Carotids 2+ bilat; no bruits. No lymphadenopathy or thryomegaly appreciated. Cor: PMI nondisplaced. Regular rate & rhythm. No rubs, gallops or murmurs. Lungs: clear Abdomen: soft, nontender, nondistended. No hepatosplenomegaly. No bruits or masses. Good bowel sounds. Extremities: no cyanosis, clubbing, rash, tr edema Neuro: alert & orientedx3, cranial nerves grossly intact. moves all 4 extremities w/o difficulty. Affect pleasant   Improving steadily. Ok for d/c home today. Meds reviewed closely.  Will need close f/u in HF Clinic. Will arrange.    MD discharge time 40 mins    Toribio Fuel, MD  10:39 AM

## 2024-07-08 NOTE — Telephone Encounter (Signed)
 Pharmacy Patient Advocate Encounter  Received notification from [employer] that Prior Authorization for Jardiance  10 mg has been APPROVED from 07/08/2024 to 07/07/2026. Ran test claim, Copay is $56.48. This test claim was processed through [facility]- copay amounts may vary at other pharmacies due to pharmacy/plan contracts, or as the patient moves through the different stages of their insurance plan.   PA #/Case ID/Reference #: [id number]

## 2024-07-08 NOTE — Progress Notes (Signed)
 Pt preparing for d/c, up at sink. Discussed with pt MI, daily wts, low sodium diet, exercise, and CRPII. Pt nodded reception but did have deficits. Will refer to [place] CRPII. Gave HF booklet.  [id number] Aliene Aris BS, ACSM-CEP 07/08/2024 2:43 PM

## 2024-07-08 NOTE — Plan of Care (Signed)
  Problem: Education: Goal: Knowledge of General Education information will improve Description: Including pain rating scale, medication(s)/side effects and non-pharmacologic comfort measures Outcome: Progressing   Problem: Health Behavior/Discharge Planning: Goal: Ability to manage health-related needs will improve Outcome: Progressing   Problem: Clinical Measurements: Goal: Ability to maintain clinical measurements within normal limits will improve Outcome: Progressing Goal: Will remain free from infection Outcome: Progressing Goal: Diagnostic test results will improve Outcome: Progressing Goal: Respiratory complications will improve Outcome: Progressing Goal: Cardiovascular complication will be avoided Outcome: Progressing   Problem: Activity: Goal: Risk for activity intolerance will decrease Outcome: Progressing   Problem: Nutrition: Goal: Adequate nutrition will be maintained Outcome: Progressing   Problem: Coping: Goal: Level of anxiety will decrease Outcome: Progressing   Problem: Elimination: Goal: Will not experience complications related to bowel motility Outcome: Progressing Goal: Will not experience complications related to urinary retention Outcome: Progressing   Problem: Pain Managment: Goal: General experience of comfort will improve and/or be controlled Outcome: Progressing   Problem: Safety: Goal: Ability to remain free from injury will improve Outcome: Progressing   Problem: Skin Integrity: Goal: Risk for impaired skin integrity will decrease Outcome: Progressing   Problem: Education: Goal: Understanding of CV disease, CV risk reduction, and recovery process will improve Outcome: Progressing Goal: Individualized Educational Video(s) Outcome: Progressing   Problem: Activity: Goal: Ability to return to baseline activity level will improve Outcome: Progressing   Problem: Cardiovascular: Goal: Ability to achieve and maintain adequate  cardiovascular perfusion will improve Outcome: Progressing Goal: Vascular access site(s) Level 0-1 will be maintained Outcome: Progressing   Problem: Health Behavior/Discharge Planning: Goal: Ability to safely manage health-related needs after discharge will improve Outcome: Progressing   Problem: Education: Goal: Ability to describe self-care measures that may prevent or decrease complications (Diabetes Survival Skills Education) will improve Outcome: Progressing Goal: Individualized Educational Video(s) Outcome: Progressing   Problem: Coping: Goal: Ability to adjust to condition or change in health will improve Outcome: Progressing   Problem: Fluid Volume: Goal: Ability to maintain a balanced intake and output will improve Outcome: Progressing   Problem: Health Behavior/Discharge Planning: Goal: Ability to identify and utilize available resources and services will improve Outcome: Progressing Goal: Ability to manage health-related needs will improve Outcome: Progressing   Problem: Metabolic: Goal: Ability to maintain appropriate glucose levels will improve Outcome: Progressing   Problem: Nutritional: Goal: Maintenance of adequate nutrition will improve Outcome: Progressing Goal: Progress toward achieving an optimal weight will improve Outcome: Progressing   Problem: Skin Integrity: Goal: Risk for impaired skin integrity will decrease Outcome: Progressing   Problem: Tissue Perfusion: Goal: Adequacy of tissue perfusion will improve Outcome: Progressing   Problem: Activity: Goal: Ability to tolerate increased activity will improve Outcome: Progressing   Problem: Respiratory: Goal: Ability to maintain a clear airway and adequate ventilation will improve Outcome: Progressing   Problem: Role Relationship: Goal: Method of communication will improve Outcome: Progressing

## 2024-07-08 NOTE — Progress Notes (Signed)
 Occupational Therapy Treatment Patient Details Name: Andrew Brock MRN: [id number] DOB: 1961-07-24 Today's Date: 07/08/2024   History of present illness 63 yo male adm 06/28/24 with chest pain, STEMI. 7/18 LHC/RHC with LAD perforation. 7/21 pericardiocentesis with drain. Intubated 7/18-7/22. 7/23 Afib, pericardio drain removed. Pt with acute HFrEF.  PMH: morbid obesity, DM, NHL, dyslipidemia   OT comments  Pt in bed upon therapy arrival and agreeable to participate in OT treatment session. Session focused on pt education regarding use of AE to complete LB ADL tasks, functional transfers, and safety awareness and judgement with use of RW. Pt educated on use of reacher and sock aid to assist with LB ADL task of sock management. Pt was able to demonstrate understanding and carry over of education provided. Patient will benefit from intensive inpatient follow-up therapy, >3 hours/day.       If plan is discharge home, recommend the following:  A little help with walking and/or transfers;A little help with bathing/dressing/bathroom;Assistance with cooking/housework;Help with stairs or ramp for entrance   Equipment Recommendations  Tub/shower bench;BSC/3in1       Precautions / Restrictions Precautions Precautions: Fall Recall of Precautions/Restrictions: Intact Precaution/Restrictions Comments: PICC line Restrictions Weight Bearing Restrictions Per Provider Order: No       Mobility Bed Mobility Overal bed mobility: Needs Assistance       Supine to sit: Supervision, Used rails, HOB elevated     General bed mobility comments: No physical assist needed to complete task.    Transfers Overall transfer level: Needs assistance Equipment used: Rolling walker (2 wheels)   Sit to Stand: Supervision     Step pivot transfers: Supervision     General transfer comment: Cues for hand placement with RW management prior to sit to stand transition. Pt preferred to have hands resting on RW  handles when completing sit to stand while utilizing his legs only to assist with power up.     Balance Overall balance assessment: No apparent balance deficits (not formally assessed)       ADL either performed or assessed with clinical judgement   ADL        Lower Body Dressing: Supervision/safety;With adaptive equipment Lower Body Dressing Details (indicate cue type and reason): Pt utilized reacher and sock aid to complete sock management during session. OT educated pt on form and technique with AE use. Pt able to return demonstration with VC provided as needed.               Communication Communication Communication: No apparent difficulties   Cognition Arousal: Alert Behavior During Therapy: WFL for tasks assessed/performed Cognition: No apparent impairments   Following commands: Intact        Cueing   Cueing Techniques: Verbal cues             Pertinent Vitals/ Pain       Pain Assessment Pain Assessment: No/denies pain (reports on-going peripheral neuropathy on the lateral aspect of right thigh)         Frequency  Min 2X/week        Progress Toward Goals  OT Goals(current goals can now be found in the care plan section)  Progress towards OT goals: Progressing toward goals            AM-PAC OT 6 Clicks Daily Activity     Outcome Measure   Help from another person eating meals?: None Help from another person taking care of personal grooming?: None Help from another person toileting, which includes using  toliet, bedpan, or urinal?: A Little Help from another person bathing (including washing, rinsing, drying)?: A Little Help from another person to put on and taking off regular upper body clothing?: A Little Help from another person to put on and taking off regular lower body clothing?: A Little 6 Click Score: 20    End of Session Equipment Utilized During Treatment: Rolling walker (2 wheels)  OT Visit Diagnosis: Unsteadiness on feet  (R26.81);Other abnormalities of gait and mobility (R26.89);Muscle weakness (generalized) (M62.81)   Activity Tolerance Patient tolerated treatment well   Patient Left in chair;with call bell/phone within reach   Nurse Communication Mobility status        Time: 9171-9092 OT Time Calculation (min): 39 min  Charges: OT General Charges $OT Visit: 1 Visit OT Treatments $Self Care/Home Management : 38-52 mins  Leita Howell, OTR/L,CBIS  Supplemental OT - [facility] and [facility] Secure Chat Preferred    Donnie Panik, Leita BIRCH 07/08/2024, 9:20 AM

## 2024-07-08 NOTE — Progress Notes (Addendum)
 PHARMACY - ANTICOAGULATION Pharmacy Consult for Heparin   Indication: atrial fibrillation / ACS Brief A/P: Heparin  level subtherapeutic Increase Heparin  rate  No Known Allergies  Patient Measurements: Height: 5' 11 (180.3 cm) Weight: 135.1 kg (297 lb 13.5 oz) IBW/kg (Calculated) : 75.3 HEPARIN  DW (KG): 108.4  Vital Signs: Temp: 97.6 F (36.4 C) (07/28 0342) Temp Source: Oral (07/28 0342) BP: 102/75 (07/28 0342) Pulse Rate: 73 (07/28 0342)  Labs: Recent Labs    07/06/24 0500 07/07/24 0520 07/07/24 1600 07/08/24 0500  HGB 11.9* 11.2*  --  12.2*  HCT 36.0* 34.2*  --  36.9*  PLT 349 370  --  448*  HEPARINUNFRC  --  <0.10* 0.19* <0.10*  CREATININE 1.48* 1.47*  --   --     Estimated Creatinine Clearance: 75 mL/min (A) (by C-G formula based on SCr of 1.47 mg/dL (H)).  Assessment: 63 y.o. male with Afib and pericardial effusion s/p drain for heparin   Goal of Therapy:  Heparin  level 0.3-0.7 units/ml Monitor platelets by anticoagulation protocol: Yes   Plan:  Increase Heparin  1600 units/hr Check heparin  level in 8 hours.   Cathlyn Arrant, PharmD, BCPS  07/08/2024 6:18 AM

## 2024-07-08 NOTE — Progress Notes (Addendum)
 Inpatient Rehab Admissions Coordinator:   Following for my colleague, Heron Leavell.  No physical assist required for mobility/gait up to 300' without device today.  Supervision for LB dressing with AE.  Some concern for cognitive deficits.  Unfortunately he is mobilizing too well to consider CIR at this time and pt prefers to d/c home.  We will not pursue admit.   Reche Lowers, PT, DPT Admissions Coordinator [phone] 07/08/24  1:52 PM

## 2024-07-08 NOTE — Telephone Encounter (Addendum)
 Patient Product/process development scientist completed.    The patient is insured through [employer]. Patient has [employer], may use a copay card, and/or apply for patient assistance if available.    Ran test claim for Eliquis  5 mg and the current 30 day co-pay is $56.48.  Ran test claim for Jardiance  10 mg and Requires Prior Authorization  This test claim was processed through [employer]- copay amounts may vary at other pharmacies due to [employer], or as the patient moves through the different stages of their insurance plan.     Reyes Sharps, CPHT Pharmacy Technician III Certified Patient Advocate [facility] Pharmacy Patient Advocate Team Direct Number: [phone]  Fax: [phone]

## 2024-07-08 NOTE — Progress Notes (Addendum)
 Advanced Heart Failure Rounding Note  Cardiologist: None  AHF Consulting MD: Dr. Zenaida Chief Complaint: Acute biventricular systolic heart failure Patient Profile   63 y.o. male with a PMH of non-Hodgkin's lymphoma who presents with anterior STEMI complicated by distal LAD perforation. Now with CGS.  Subjective:    7/18: LHC c/b LAD perforation>CGS 7/20: in AF RVR, converted to NSR on IV amio 7/21: worsening hemodynamics s/p pericardiocentesis w drain placement (~500cc total out) 7/22: Extubated.  7/23: Back in AF. Pericardial drain removed. Echo w/o evidence of reaccumulation 7/27: DBA weaned off.   Coox 56%. CVP 8-9. 4.4L UOP on Lasix  bid + Metolazone . sCr stable.   Feeling much better this morning. Working with PT. Breathing improved. Reports dry cough yesterday that is now resolved. Wanting to go home today.   Objective:    Weight Range: 131 kg Body mass index is 40.28 kg/m.   Vital Signs:   Temp:  [97.6 F (36.4 C)-98.5 F (36.9 C)] 97.6 F (36.4 C) (07/28 0342) Pulse Rate:  [73-84] 73 (07/28 0342) Resp:  [18-21] 21 (07/28 0342) BP: (98-119)/(69-85) 102/75 (07/28 0342) SpO2:  [93 %-97 %] 95 % (07/28 0342) Weight:  [131 kg] 131 kg (07/28 0500) Last BM Date : 07/07/24  Weight change: Filed Weights   07/06/24 0648 07/07/24 0500 07/08/24 0500  Weight: (!) 136.7 kg 135.1 kg 131 kg   Intake/Output:  Intake/Output Summary (Last 24 hours) at 07/08/2024 0732 Last data filed at 07/08/2024 0340 Gross per 24 hour  Intake 240 ml  Output 4401 ml  Net -4161 ml    Physical Exam   CVP 8-9 General: Well appearing.  Cardiac: S1 and S2 present. No murmurs or rub. Resp: Lung sounds clear and equal B/L Extremities: Warm and dry.  Trace b/l ankle edema.  Neuro: Alert and oriented x3. Affect pleasant.   Telemetry   SR in 70s (personally reviewed)  Medications:    Scheduled Medications:  aspirin   81 mg Oral Daily   atorvastatin   80 mg Oral Daily    Chlorhexidine  Gluconate Cloth  6 each Topical Daily   colchicine   0.6 mg Oral Daily   digoxin   0.125 mg Oral Daily   insulin  aspart  0-20 Units Subcutaneous TID WC   insulin  aspart  0-5 Units Subcutaneous QHS   insulin  glargine-yfgn  20 Units Subcutaneous BID   losartan   25 mg Oral Daily   polyethylene glycol  17 g Oral BID   senna  2 tablet Oral QHS   sodium chloride  flush  3 mL Intravenous Q12H   spironolactone   25 mg Oral Daily    Infusions:  amiodarone  30 mg/hr (07/07/24 2228)   heparin  1,600 Units/hr (07/08/24 0626)    PRN Medications: acetaminophen , ondansetron  (ZOFRAN ) IV, mouth rinse, oxyCODONE , sodium chloride  flush, sodium chloride  flush  Assessment/Plan   Acute HFrEF with Cardiogenic shock: Biventricular, EF 20-25%, with mildly reduced RV. Suspect long standing given elevated PA pressures, cardiomyopathy out of proportion to ischemic disease. LA normal, however reduced CO/CI on PAC. S/P pericardiocentesis with drain placement with improvement of CO/CI. 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 - Coox 56%. Off DBA.  - Start Torsemide  20 mg daily - Add Jardiance  10 - continue digoxin  0.125 mg daily - continue losartan  25mg  daily, BP too low to titrate - continue spiro 25mg  daily - does not appear to have significant social support at home and given obesity  is not a heart transplant candidate, marginal LVAD candidate given RV failure  CAD with anterior STEMI: Distal LAD disease, potentially embolic with clear cut off sign. Case complicated by distal perforation. - Continue aspirin .  - see above.   Pericardial effusion: Traumatic with recent perforation.  - Mild worsening on echo 7/20 some slight TV variation but no other evidence of tamponade - Echo 7/21 with increased circumferential effusion without signs of tamponade on echo, however with worsening hemodynamics - S/P  pericardiocentesis with drain placement with improvement of CO/CI (250 cc bloody fluid drained in cath lab) - Drain removed 7/23; hgb stable; hemodynamics stable; no effusion on echo 7/23 or 7/25. - transition to Eliquis  5 mg bid, d/w PharmD. Can repeat echo if concern for re-accumulation  - Continue colchicine  0.6 mg daily  Acute hypoxic respiratory failure:  - Due to pulmonary edema/?PNA - Extubated 7/22 - Zosyn , complete on 7/25 - resolved.   AKI: Due to shock and cardiorenal syndrome.  - Cr stable, 1.54  AF/AFL: - new this admit with RVR - Remains in NSR - transition to PO amio 200 mg bid for 2 weeks, then 200 mg daily  Continue PT/OT. May be candidate for CIR once more stable.  Length of Stay: 10  [place] Lee, NP  07/08/2024, 7:32 AM  Advanced Heart Failure Team Pager [phone] (M-F; 7a - 5p)  Please contact [facility] for night-coverage after hours (5p -7a ) and weekends on [email or web address]  Patient seen and examined with the above-signed Advanced Practice Provider and/or Housestaff. I personally reviewed laboratory data, imaging studies and relevant notes. I independently examined the patient and formulated the important aspects of the plan. I have edited the note to reflect any of my changes or salient points. I have personally discussed the plan with the patient and/or family.  Feels good today. Anxious to go home. Remains in NSR. Co-ox stable off DBA   General:  Sitting up in chair. No resp difficulty HEENT: normal Neck: supple. JVP 7-8  Carotids 2+ bilat; no bruits. No lymphadenopathy or thryomegaly appreciated. Cor: PMI nondisplaced. Regular rate & rhythm. No rubs, gallops or murmurs. Lungs: clear Abdomen: soft, nontender, nondistended. No hepatosplenomegaly. No bruits or masses. Good bowel sounds. Extremities: no cyanosis, clubbing, rash, tr edema Neuro: alert & orientedx3, cranial nerves grossly intact. moves all 4 extremities w/o difficulty. Affect  pleasant  Improving steadily. Ok for d/c home today. Meds reviewed closely.  Will need close f/u in HF Clinic. Will arrange.   MD discharge time 40 mins   Toribio Fuel, MD  10:39 AM

## 2024-07-08 NOTE — Discharge Instructions (Signed)
Information on my medicine - ELIQUIS (apixaban)  This medication education was reviewed with me or my healthcare representative as part of my discharge preparation.  The pharmacist that spoke with me during my hospital stay was:  Einar Grad, [facility]  Why was Eliquis prescribed for you? Eliquis was prescribed for you to reduce the risk of a blood clot forming that can cause a stroke if you have a medical condition called atrial fibrillation (a type of irregular heartbeat).  What do You need to know about Eliquis ? Take your Eliquis TWICE DAILY - one tablet in the morning and one tablet in the evening with or without food. If you have difficulty swallowing the tablet whole please discuss with your pharmacist how to take the medication safely.  Take Eliquis exactly as prescribed by your doctor and DO NOT stop taking Eliquis without talking to the doctor who prescribed the medication.  Stopping may increase your risk of developing a stroke.  Refill your prescription before you run out.  After discharge, you should have regular check-up appointments with your healthcare provider that is prescribing your Eliquis.  In the future your dose may need to be changed if your kidney function or weight changes by a significant amount or as you get older.  What do you do if you miss a dose? If you miss a dose, take it as soon as you remember on the same day and resume taking twice daily.  Do not take more than one dose of ELIQUIS at the same time to make up a missed dose.  Important Safety Information A possible side effect of Eliquis is bleeding. You should call your healthcare provider right away if you experience any of the following: Bleeding from an injury or your nose that does not stop. Unusual colored urine (red or dark brown) or unusual colored stools (red or black). Unusual bruising for unknown reasons. A serious fall or if you hit your head (even if there is no bleeding).  Some  medicines may interact with Eliquis and might increase your risk of bleeding or clotting while on Eliquis. To help avoid this, consult your healthcare provider or pharmacist prior to using any new prescription or non-prescription medications, including herbals, vitamins, non-steroidal anti-inflammatory drugs (NSAIDs) and supplements.  This website has more information on Eliquis (apixaban): http://[email or web address].com/eliquis/home

## 2024-07-08 NOTE — Progress Notes (Addendum)
 Physical Therapy Treatment Patient Details Name: Andrew Brock MRN: [id number] DOB: [birth year] Today's Date: 07/08/2024   History of Present Illness 63 yo male adm 06/28/24 with chest pain, STEMI. 7/18 LHC/RHC with LAD perforation. 7/21 pericardiocentesis with drain. Intubated 7/18-7/22. 7/23 Afib, pericardio drain removed. Pt with acute HFrEF.  PMH: morbid obesity, DM, NHL, dyslipidemia    PT Comments  Pt very pleasant states he lives alone but has brother in the area who can assist. Pt with significant improvement in mobility able to walk long hall distance with supervision, perform stairs with min assist and continues to demonstrate deficits in cognition. Pt without apparent carryover of education for his cognitive errors and needs to have assist for cooking and driving initially. Pt would benefit from OPPT if family or friends able to provide transportation and if not HHPT to assess safety and function in home. Pt returned to bed for PICC removal with continued mobility and OOB encouraged.   HR 87-97 SPO2 97% on RA   If plan is discharge home, recommend the following: Help with stairs or ramp for entrance;Assistance with cooking/housework;Assist for transportation;A little help with walking and/or transfers;A little help with bathing/dressing/bathroom;Direct supervision/assist for financial management   Can travel by private vehicle        Equipment Recommendations  None recommended by PT    Recommendations for Other Services       Precautions / Restrictions Precautions Precautions: Fall Recall of Precautions/Restrictions: Intact     Mobility  Bed Mobility Overal bed mobility: Modified Independent Bed Mobility: Sit to Supine       Sit to supine: Modified independent (Device/Increase time)   General bed mobility comments: bed flat    Transfers Overall transfer level: Needs assistance   Transfers: Sit to/from Stand Sit to Stand: Supervision           General  transfer comment: supervision for lines and safety. pt able to rise from  recliner and bed x 5 trials at bed with posterior bias x 1 and reliant on leg support on surface to maintain balance    Ambulation/Gait Ambulation/Gait assistance: Contact guard assist Gait Distance (Feet): 300 Feet Assistive device: None Gait Pattern/deviations: Step-through pattern, Decreased stride length   Gait velocity interpretation: 1.31 - 2.62 ft/sec, indicative of limited community ambulator   General Gait Details: pt intially walking 48' with RW but remainder of gait without device with cues for direction, steady gait and no SOB   Stairs Stairs: Yes Stairs assistance: Min assist Stair Management: Alternating pattern, Forwards, One rail Right Number of Stairs: 4 General stair comments: pt completed 4 stairs with min assist due to LOB on 3rd step requiring rail and physical assist to recover. pt states he can enter from balcony without steps   Wheelchair Mobility     Tilt Bed    Modified Rankin (Stroke Patients Only)       Balance Overall balance assessment: Mild deficits observed, not formally tested Sitting-balance support: No upper extremity supported, Feet supported Sitting balance-Leahy Scale: Good     Standing balance support: No upper extremity supported Standing balance-Leahy Scale: Good Standing balance comment: walking without AD                            Communication Communication Communication: No apparent difficulties  Cognition Arousal: Alert Behavior During Therapy: WFL for tasks assessed/performed   PT - Cognitive impairments: No family/caregiver present to determine baseline, Problem solving, Safety/Judgement, Orientation  PT - Cognition Comments: 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 when asked the president he said the orange guy, george bush with cues for error corrected to trump. Pt unable to spell world backward  or count by 3 Following commands: Intact      Cueing Cueing Techniques: Verbal cues  Exercises      General Comments        Pertinent Vitals/Pain Pain Assessment Pain Assessment: No/denies pain    Home Living                          Prior Function            PT Goals (current goals can now be found in the care plan section) Progress towards PT goals: Progressing toward goals    Frequency    Min 2X/week      PT Plan      Co-evaluation              AM-PAC PT 6 Clicks Mobility   Outcome Measure  Help needed turning from your back to your side while in a flat bed without using bedrails?: None Help needed moving from lying on your back to sitting on the side of a flat bed without using bedrails?: None Help needed moving to and from a bed to a chair (including a wheelchair)?: A Little Help needed standing up from a chair using your arms (e.g., wheelchair or bedside chair)?: A Little Help needed to walk in hospital room?: A Little Help needed climbing 3-5 steps with a railing? : A Little 6 Click Score: 20    End of Session Equipment Utilized During Treatment: Gait belt Activity Tolerance: Patient tolerated treatment well Patient left: in bed;with call bell/phone within reach Nurse Communication: Mobility status PT Visit Diagnosis: Unsteadiness on feet (R26.81);Other abnormalities of gait and mobility (R26.89);Muscle weakness (generalized) (M62.81)     Time: 8879-8855 PT Time Calculation (min) (ACUTE ONLY): 24 min  Charges:    $Gait Training: 8-22 mins $Therapeutic Activity: 8-22 mins PT General Charges $$ ACUTE PT VISIT: 1 Visit                     Andrew Brock, PT Acute Rehabilitation Services Office: [phone]    Andrew Brock 07/08/2024, 1:27 PM

## 2024-07-08 NOTE — Telephone Encounter (Signed)
 Pharmacy Patient Advocate Encounter   Received notification from Inpatient Request that prior authorization for Jardiance  10 mg is required/requested.   Insurance verification completed.   The patient is insured through [employer] .   Per test claim: PA required; PA submitted to above mentioned insurance via Prompt PA Key/confirmation #/EOC [id number] Status is pending

## 2024-07-08 NOTE — Progress Notes (Addendum)
 Reviewed AVS, patient expressed understanding of medications, MD follow up reviewed.   Removed IV, Site clean, dry and intact.  Patient states all belongings brought to the hospital at time of admission are accounted for and packed to take home.  Patient informed and expressed understanding to pick up medications from [facility] pharmacy.  Patient refused to go to discharge lounge because his brother is on the way; Charge nurse informed.

## 2024-07-08 NOTE — TOC Initial Note (Addendum)
 Transition of Care [facility]) - Initial/Assessment Note    Patient Details  Name: Andrew Brock MRN: [id number] Date of Birth: Feb 27, 1961  Transition [facility] CM/SW Contact:    Justina Delcia Czar, RN Phone Number: [phone] 07/08/2024, 2:21 PM  Clinical Narrative:                  Spoke to pt and offered choice for [facility] Medicare.gov list given and placed on chart. Pt requesting 3n1 bedside commode. Faxed referral to [facility] for DME.  Contacted Hedda hunter Huxley with new referral for [facility].   Pt has scale at home for daily weights. Discussed low sodium/heart healthy diet. 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 Provided pt with Living Better with HF booklet.   Pt declined RW for home. Brother will provide transportation home.    Expected Discharge Plan: IP Rehab Facility Barriers to Discharge: No Barriers Identified   Patient Goals and CMS Choice Patient states their goals for this hospitalization and ongoing recovery are:: wants to go home CMS Medicare.gov Compare Post Acute Care list provided to:: Patient Choice offered to / list presented to : Patient      Expected Discharge Plan and Services   Discharge Planning Services: CM Consult Post Acute Care Choice: Home Health Living arrangements for the past 2 months: Apartment Expected Discharge Date: 07/08/24                         HH Arranged: RN, PT HH Agency: [facility] Date [facility] Agency Contacted: 07/08/24 Time HH Agency Contacted: 1419 Representative spoke with at [facility] Agency: [place]  Prior Living Arrangements/Services Living arrangements for the past 2 months: Apartment Lives with:: Self Patient language and need for interpreter reviewed:: Yes Do you feel safe going back to the place where you live?: Yes      Need for Family Participation in Patient Care: No (Comment) Care giver support system in place?: No (comment)   Criminal Activity/Legal  Involvement Pertinent to Current Situation/Hospitalization: No - Comment as needed  Activities of Daily Living      Permission Sought/Granted Permission sought to share information with : Case Manager, PCP, Family Supports Permission granted to share information with : Yes, Verbal Permission Granted  Share Information with NAME: Renay Justina  Permission granted to share info w AGENCY: Home Health, PCP, DME  Permission granted to share info w Relationship: brother  Permission granted to share info w Contact Information: [phone]  Emotional Assessment Appearance:: Appears stated age Attitude/Demeanor/Rapport: Engaged Affect (typically observed): Accepting Orientation: : Oriented to Self, Oriented to Place, Oriented to  Time, Oriented to Situation   Psych Involvement: No (comment)  Admission diagnosis:  ST elevation myocardial infarction (STEMI), unspecified artery (HCC) [I21.3] Acute ST elevation myocardial infarction (STEMI) due to occlusion of distal portion of left anterior descending (LAD) coronary artery (HCC) [I21.02] Patient Active Problem List   Diagnosis Date Noted   Acute ST elevation myocardial infarction (STEMI) due to occlusion of distal portion of left anterior descending (LAD) coronary artery (HCC) 06/28/2024   PCP:  [facility] Pharmacy:   [id number] [place], [place] - [address] AT [facility] [address] & [facility] [address] [place] [address] Phone: [phone] Fax: [phone]  [id number] - [place], [place] - [address] AT [address]  [place] [address] Phone: [phone] Fax: [phone]  [facility] [address] [place] [address] Phone: [phone] Fax: [phone]     Social Drivers of Health (SDOH) Social History: SDOH Screenings   Food Insecurity: No Food Insecurity (07/08/2024)  Housing: Low Risk   (07/08/2024)  Transportation Needs: No Transportation Needs (07/08/2024)  Utilities: Not At Risk (07/08/2024)  Social Connections: Unknown (04/25/2022)   Received from [facility]  Tobacco Use: Low Risk  (11/10/2023)   SDOH Interventions: Food Insecurity Interventions: Intervention Not Indicated Housing Interventions: Intervention Not Indicated Transportation Interventions: Intervention Not Indicated Utilities Interventions: Intervention Not Indicated   Readmission Risk Interventions     No data to display

## 2024-07-15 ENCOUNTER — Telehealth (HOSPITAL_COMMUNITY): Payer: Self-pay

## 2024-07-15 NOTE — Telephone Encounter (Signed)
 Called to confirm/remind patient of their appointment at the [facility] on 07/16/24.   Appointment:   [x] Confirmed  [] Left mess   [] No answer/No voice mail  [] VM Full/unable to leave message  [] Phone not in service  Patient reminded to bring all medications and/or complete list.  Confirmed patient has transportation. Gave directions, instructed to utilize valet parking.

## 2024-07-16 ENCOUNTER — Encounter (HOSPITAL_COMMUNITY): Payer: Self-pay

## 2024-07-16 ENCOUNTER — Ambulatory Visit (HOSPITAL_COMMUNITY): Payer: Self-pay | Admitting: Cardiology

## 2024-07-16 ENCOUNTER — Ambulatory Visit (HOSPITAL_COMMUNITY)
Admit: 2024-07-16 | Discharge: 2024-07-16 | Disposition: A | Discharge status: Home / Self Care | Attending: Cardiology | Admitting: Cardiology

## 2024-07-16 VITALS — BP 112/70 | HR 96 | Ht 71.0 in | Wt 283.0 lb

## 2024-07-16 DIAGNOSIS — I480 Paroxysmal atrial fibrillation: Secondary | ICD-10-CM | POA: Diagnosis not present

## 2024-07-16 DIAGNOSIS — I5082 Biventricular heart failure: Secondary | ICD-10-CM | POA: Insufficient documentation

## 2024-07-16 DIAGNOSIS — Z79899 Other long term (current) drug therapy: Secondary | ICD-10-CM | POA: Diagnosis not present

## 2024-07-16 DIAGNOSIS — N179 Acute kidney failure, unspecified: Secondary | ICD-10-CM

## 2024-07-16 DIAGNOSIS — I5022 Chronic systolic (congestive) heart failure: Secondary | ICD-10-CM

## 2024-07-16 DIAGNOSIS — I2129 ST elevation (STEMI) myocardial infarction involving other sites: Secondary | ICD-10-CM | POA: Diagnosis not present

## 2024-07-16 DIAGNOSIS — R600 Localized edema: Secondary | ICD-10-CM | POA: Diagnosis not present

## 2024-07-16 DIAGNOSIS — R5383 Other fatigue: Secondary | ICD-10-CM | POA: Insufficient documentation

## 2024-07-16 DIAGNOSIS — Z8571 Personal history of Hodgkin lymphoma: Secondary | ICD-10-CM | POA: Insufficient documentation

## 2024-07-16 DIAGNOSIS — K921 Melena: Secondary | ICD-10-CM | POA: Insufficient documentation

## 2024-07-16 DIAGNOSIS — Z7982 Long term (current) use of aspirin: Secondary | ICD-10-CM | POA: Diagnosis not present

## 2024-07-16 DIAGNOSIS — I3139 Other pericardial effusion (noninflammatory): Secondary | ICD-10-CM | POA: Diagnosis not present

## 2024-07-16 DIAGNOSIS — I2510 Atherosclerotic heart disease of native coronary artery without angina pectoris: Secondary | ICD-10-CM

## 2024-07-16 DIAGNOSIS — Z7901 Long term (current) use of anticoagulants: Secondary | ICD-10-CM | POA: Insufficient documentation

## 2024-07-16 DIAGNOSIS — I5032 Chronic diastolic (congestive) heart failure: Secondary | ICD-10-CM | POA: Insufficient documentation

## 2024-07-16 LAB — BASIC METABOLIC PANEL WITH GFR
Anion gap: 13 (ref 5–15)
BUN: 23 mg/dL — ABNORMAL HIGH (ref 6–20)
CO2: 24 mmol/L (ref 22–32)
Calcium: 9 mg/dL (ref 8.9–10.3)
Chloride: 104 mmol/L (ref 98–111)
Creatinine, Ser: 1.73 mg/dL — ABNORMAL HIGH (ref 0.61–1.24)
GFR, Estimated: 45 mL/min — ABNORMAL LOW (ref >60)
Glucose, Bld: 161 mg/dL — ABNORMAL HIGH (ref 70–99)
Potassium: 4.9 mmol/L (ref 3.5–5.1)
Sodium: 141 mmol/L (ref 135–145)

## 2024-07-16 LAB — CBC
HCT: 43.4 % (ref 39.0–52.0)
Hemoglobin: 14 g/dL (ref 13.0–17.0)
MCH: 29 pg (ref 26.0–34.0)
MCHC: 32.3 g/dL (ref 30.0–36.0)
MCV: 90 fL (ref 80.0–100.0)
Platelets: 544 K/uL — ABNORMAL HIGH (ref 150–400)
RBC: 4.82 MIL/uL (ref 4.22–5.81)
RDW: 15.1 % (ref 11.5–15.5)
WBC: 6.7 K/uL (ref 4.0–10.5)
nRBC: 0 % (ref 0.0–0.2)

## 2024-07-16 LAB — BRAIN NATRIURETIC PEPTIDE: B Natriuretic Peptide: 410.9 pg/mL — ABNORMAL HIGH (ref 0.0–100.0)

## 2024-07-16 MED ORDER — METOPROLOL SUCCINATE ER 25 MG PO TB24: 25.0000 mg | ORAL_TABLET | Freq: Every day | ORAL | 3 refills | Status: AC

## 2024-07-16 NOTE — Patient Instructions (Signed)
 Medication Changes:  START METOPROLOL  SUCCINATE 25MG  ONCE DAILY   REMEMBER TO DECREASE AMIODARONE  TO 200MG  DAILY ON 07/22/2024  PLEASE GET BOTTLE OF BABY ASPIRIN  (81) MG AT THE DRUG STORE   Lab Work:  Labs done today, your results will be available in MyChart, we will contact you for abnormal readings.  Referrals:  YOU HAVE BEEN REFERRED TO CARDIAC REHAB THEY WILL REACH OUT TO YOU OR CALL TO ARRANGE THIS. PLEASE CALL US  WITH ANY CONCERNS   Follow-Up in: 6 WEEKS AS SCHEDULED WITH APP    THEN AGAIN IN 3 MONTHS WITH DR. ZENAIDA WITH AN ECHO---PLEASE MAKE THIS APPOINTMENT AT YOUR NEXT FOLLOW UP   At the [facility], you and your health needs are our priority. We have a designated team specialized in the treatment of Heart Failure. This Care Team includes your primary Heart Failure Specialized Cardiologist (physician), Advanced Practice Providers (APPs- Physician Assistants and Nurse Practitioners), and Pharmacist who all work together to provide you with the care you need, when you need it.   You may see any of the following providers on your designated Care Team at your next follow up:  Dr. Toribio Fuel Dr. Ezra Shuck Dr. Ria Commander Dr. Odis ZENAIDA Greig Mosses, NP Caffie Shed, [place] [facility] [place], [place] Beckey Coe, NP [place] Lee, NP Tinnie Redman, PharmD   Please be sure to bring in all your medications bottles to every appointment.   Need to Contact Us :  If you have any questions or concerns before your next appointment please send us  a message through [place] or call our office at [phone].    TO LEAVE A MESSAGE FOR THE NURSE SELECT OPTION 2, PLEASE LEAVE A MESSAGE INCLUDING: YOUR NAME DATE OF BIRTH CALL BACK NUMBER REASON FOR CALL**this is important as we prioritize the call backs  YOU WILL RECEIVE A CALL BACK THE SAME DAY AS LONG AS YOU CALL BEFORE 4:00 PM

## 2024-07-16 NOTE — Progress Notes (Signed)
 ADVANCED HF CLINIC NOTE  Primary Care: [facility], [facility] HF Cardiologist: Dr. Zenaida  HPI: 63 y.o. male with a PMH of non-Hodgkin's lymphoma, HFrEF (new diagnosis 7/25), CAD s/p STEMI c/b LAD perforations and tamponade.   Presented to [facility] as STEMI to LAD 06/28/24. Emergent PCI complicated by distal LAD perforation s/p balloon tamponade. Subsequently developed cardiogenic shock and tamponade s/p inotrope support and pericardiocentesis. Developed atrial fibrillation RVR and self converted on IV amio. Echo showed severe biventricular heart failure with EF 20-25% with severely reduced RV. Eliquis  started prior to discharge.   He returns today for post-hospital heart failure follow up. Overall feeling well. NYHA II. Has been working with physical therapy. Actively making lifestyle changes. Reports fatigue and lower extremity edema, although improving. Reports that he has had small amount of bright red blood in stool, unsure if he has hemmorrhoids. Denies chest pain, dyspnea, near-syncope, orthopnea, palpitations, and dizziness. Able to perform ADLs. Appetite okay. Has been losing weight at home. Compliant with all medications.  Past Medical History:  Diagnosis Date   Cancer (HCC)    hodgkin lymphoma 2005, treated in [place]   Diabetes mellitus (HCC)    Lateral meniscus tear    right knee   Plantar fasciitis     Current Outpatient Medications  Medication Sig Dispense Refill   albuterol (VENTOLIN HFA) 108 (90 Base) MCG/ACT inhaler Inhale 1 puff into the lungs every 4 (four) hours as needed for shortness of breath or wheezing.     amiodarone  (PACERONE ) 200 MG tablet Take 200 mg twice daily for 14 days (until 8/11), than taken 200 mg once daily 42 tablet 6   apixaban  (ELIQUIS ) 5 MG TABS tablet Take 1 tablet (5 mg total) by mouth 2 (two) times daily. 60 tablet 6   atorvastatin  (LIPITOR ) 80 MG tablet Take 80 mg by mouth daily.     colchicine  0.6 MG tablet Take 1 tablet (0.6 mg total) by  mouth daily. 30 tablet 3   digoxin  (LANOXIN ) 0.125 MG tablet Take 1 tablet (0.125 mg total) by mouth daily. 30 tablet 6   empagliflozin  (JARDIANCE ) 10 MG TABS tablet Take 1 tablet (10 mg total) by mouth daily. 30 tablet 6   insulin  glargine (LANTUS ) 100 UNIT/ML injection Inject 34 Units into the skin daily.     metFORMIN (GLUCOPHAGE) 1000 MG tablet Take 1,000 mg by mouth 2 (two) times daily with a meal.     spironolactone  (ALDACTONE ) 25 MG tablet Take 1 tablet (25 mg total) by mouth daily. 30 tablet 6   torsemide  (DEMADEX ) 20 MG tablet Take 1 tablet (20 mg total) by mouth daily. 30 tablet 6   aspirin  81 MG tablet Take 1 tablet (81 mg total) by mouth daily. (Patient not taking: Reported on 07/16/2024) 30 tablet 0   No current facility-administered medications for this encounter.    No Known Allergies    Social History   Socioeconomic History   Marital status: Single    Spouse name: Not on file   Number of children: Not on file   Years of education: Not on file   Highest education level: Not on file  Occupational History   Not on file  Tobacco Use   Smoking status: Never   Smokeless tobacco: Never  Substance and Sexual Activity   Alcohol use: No   Drug use: No   Sexual activity: Not on file  Other Topics Concern   Not on file  Social History Narrative   Not on file  Social Drivers of Corporate investment banker Strain: Not on file  Food Insecurity: No Food Insecurity (07/08/2024)   Hunger Vital Sign    Worried About Running Out of Food in the Last Year: Never true    Ran Out of Food in the Last Year: Never true  Transportation Needs: No Transportation Needs (07/08/2024)   PRAPARE - Administrator, Civil Service (Medical): No    Lack of Transportation (Non-Medical): No  Physical Activity: Not on file  Stress: Not on file  Social Connections: Unknown (04/25/2022)   Received from [facility]   Social Network    Social Network: Not on file  Intimate Partner  Violence: Not At Risk (07/08/2024)   Humiliation, Afraid, Rape, and Kick questionnaire    Fear of Current or Ex-Partner: No    Emotionally Abused: No    Physically Abused: No    Sexually Abused: No    History reviewed. No pertinent family history.  Filed Weights   07/16/24 1031  Weight: 128.4 kg (283 lb)    Vitals:   07/16/24 1031  BP: 112/70  Pulse: 96  SpO2: 96%  Weight: 128.4 kg (283 lb)  Height: 5' 11 (1.803 m)    PHYSICAL EXAM: General: Well appearing. No distress on RA Cardiac: JVP ~7cm. S1 and S2 present. No murmurs or rub. Lungs: clear Extremities: Warm and dry.  Trace BLE edema.  Neuro: Alert and oriented x3. Affect pleasant. Moves all extremities without difficulty.  ECG (personally reviewed): NSR 94bpm BAE   ASSESSMENT & PLAN:  Chronic Biventricular HFrEF  - New diagnosis in the setting of STEMI. Suspect long standing given elevated PA pressures, cardiomyopathy out of proportion to ischemic disease.  - Echo 07/03/24: EF 20-25%, RV severely reduced - NYHA II. Volume slightly elevated, but reports have been going down on Torsemide .  - continue Torsemide  20 mg daily, check BMET/BNP today - continue digoxin  0.125 mg daily - continue spiro 25mg  daily - start Toprol  XL 25 mg daily - BP too low for ARB/ARNi today, attempt to add next visit - repeat echo in 3 months, dicussed need for ICD if ED does not improve with GDMT.  - refer to cardiac rehab  CAD - recent STEMI to LAD. PCI attempted, however c/b LAD perforation and tamponade. No chest pain.  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 New diagnosis - continue amio 200 mg bid, decrease to daily on 07/22/24 - continue Eliquis  5 mg bid. Small amount of blood in stool intermittently, suspect hemorrhoids. CBC today  AKI - baseline unknown - cr 1.5 on discharge - labs today  Follow up in 6 weeks with APP Follow up in 3 month with Dr. Zenaida + echo  [place] Mende [place], NP 07/16/24

## 2024-07-22 ENCOUNTER — Telehealth (HOSPITAL_COMMUNITY): Payer: Self-pay

## 2024-07-22 NOTE — Telephone Encounter (Signed)
 Called patient regarding cardiac rehab. Patient confirmed interest. Asked which insurance he would like to use, he stated he would like us  to check his [employer] to see what his benefits are and then he will decide if he wants to reach out to the [place] to request authorization.

## 2024-07-24 ENCOUNTER — Telehealth (HOSPITAL_COMMUNITY): Payer: Self-pay

## 2024-07-24 NOTE — Telephone Encounter (Signed)
 Requested benefits information for patient from [facility], which can only be faxed. Received number and code through fax, called [facility] [employer] and spoke to [place] on 07/24/2024 @ 1:22pm who stated that all 4x CR codes need prior authorization. Attempted to speak to supervisor for clarification as Sharolyn did not seem to understand the questions I was asking him. He stated a supervisor will call back within 24 hours to go over patient's insurance information.

## 2024-07-25 ENCOUNTER — Telehealth (HOSPITAL_COMMUNITY): Payer: Self-pay

## 2024-07-25 NOTE — Telephone Encounter (Signed)
 Did not receive call back from [facility] or his supervisor today. Attempted to reach [facility] [employer] using the code given via fax but the code is no longer valid even though the codes are supposed to be good for 14 days. Sharolyn must have closed it out. Unable to reach [facility] [employer] to verify benefits.  Spoke to patient, gave him CPT codes to be able to verify benefits himself. He stated he will check his benefits and then decide if he wants to use [facility] or request VA auth from his provider. He will call and let us  know.

## 2024-07-26 ENCOUNTER — Telehealth (HOSPITAL_COMMUNITY): Payer: Self-pay

## 2024-07-26 ENCOUNTER — Telehealth (HOSPITAL_COMMUNITY): Payer: Self-pay | Admitting: *Deleted

## 2024-07-26 NOTE — Telephone Encounter (Signed)
 Patient reports that his HR Rep refuses to accept the paperwork they provided to be filled out and request a letter that needs to state when he can return and his work stipulations.

## 2024-07-26 NOTE — Telephone Encounter (Signed)
 [employer] STD form completed and signed by Dr Zenaida, RTW date TBD ~2 months, ability to RTW will be re-evaluated at [facility] 9/26, pt is aware and will pick forms up

## 2024-07-30 ENCOUNTER — Encounter (HOSPITAL_COMMUNITY): Payer: Self-pay | Admitting: *Deleted

## 2024-07-30 ENCOUNTER — Ambulatory Visit (HOSPITAL_COMMUNITY)
Admission: RE | Admit: 2024-07-30 | Discharge: 2024-07-30 | Disposition: A | Discharge status: Home / Self Care | Source: Ambulatory Visit | Attending: Cardiology | Admitting: Cardiology

## 2024-07-30 ENCOUNTER — Ambulatory Visit (HOSPITAL_COMMUNITY): Payer: Self-pay | Admitting: Cardiology

## 2024-07-30 DIAGNOSIS — I5022 Chronic systolic (congestive) heart failure: Secondary | ICD-10-CM | POA: Insufficient documentation

## 2024-07-30 LAB — BASIC METABOLIC PANEL WITH GFR
Anion gap: 11 (ref 5–15)
BUN: 12 mg/dL (ref 6–20)
CO2: 26 mmol/L (ref 22–32)
Calcium: 8.9 mg/dL (ref 8.9–10.3)
Chloride: 99 mmol/L (ref 98–111)
Creatinine, Ser: 1.39 mg/dL — ABNORMAL HIGH (ref 0.61–1.24)
GFR, Estimated: 58 mL/min — ABNORMAL LOW (ref >60)
Glucose, Bld: 163 mg/dL — ABNORMAL HIGH (ref 70–99)
Potassium: 3.7 mmol/L (ref 3.5–5.1)
Sodium: 136 mmol/L (ref 135–145)

## 2024-07-30 NOTE — Telephone Encounter (Signed)
 Letter completed for patient and left at front desk for him to pick up, pt aware

## 2024-08-02 ENCOUNTER — Telehealth (HOSPITAL_COMMUNITY): Payer: Self-pay

## 2024-08-02 NOTE — Telephone Encounter (Signed)
 FMLA paper work left at front desk for patient to collect. Left message for patient to inform him paper work ready for collection. My chart message also sent

## 2024-08-03 ENCOUNTER — Other Ambulatory Visit (HOSPITAL_COMMUNITY): Payer: Self-pay

## 2024-08-08 ENCOUNTER — Telehealth (HOSPITAL_COMMUNITY): Payer: Self-pay

## 2024-08-08 NOTE — Telephone Encounter (Signed)
 Called patient to see if he was interested in participating in the Cardiac Rehab Program. Patient will come in for orientation on 9/11 and will attend the 8:15 exercise class on Mondays and Wednesdays.  Patient verified his insurance through [facility], states they verified he is covered 100% for 36 TCR visits.  Sent MyChart message.

## 2024-08-21 ENCOUNTER — Telehealth (HOSPITAL_COMMUNITY): Payer: Self-pay

## 2024-08-21 NOTE — Telephone Encounter (Signed)
 Called pt to confirm orientation appointment and go over cardiac risk assessment @ [phone].

## 2024-08-22 ENCOUNTER — Encounter (HOSPITAL_COMMUNITY)
Admission: RE | Admit: 2024-08-22 | Discharge: 2024-08-22 | Disposition: A | Discharge status: Home / Self Care | Source: Ambulatory Visit | Attending: Cardiology | Admitting: Cardiology

## 2024-08-22 ENCOUNTER — Other Ambulatory Visit (HOSPITAL_COMMUNITY): Payer: Self-pay

## 2024-08-22 VITALS — BP 112/60 | HR 91 | Ht 69.0 in | Wt 289.5 lb

## 2024-08-22 DIAGNOSIS — Z7901 Long term (current) use of anticoagulants: Secondary | ICD-10-CM | POA: Diagnosis not present

## 2024-08-22 DIAGNOSIS — Z9861 Coronary angioplasty status: Secondary | ICD-10-CM | POA: Insufficient documentation

## 2024-08-22 DIAGNOSIS — I252 Old myocardial infarction: Secondary | ICD-10-CM | POA: Diagnosis not present

## 2024-08-22 DIAGNOSIS — Z48812 Encounter for surgical aftercare following surgery on the circulatory system: Secondary | ICD-10-CM | POA: Diagnosis present

## 2024-08-22 DIAGNOSIS — I213 ST elevation (STEMI) myocardial infarction of unspecified site: Secondary | ICD-10-CM | POA: Diagnosis present

## 2024-08-22 DIAGNOSIS — I2102 ST elevation (STEMI) myocardial infarction involving left anterior descending coronary artery: Secondary | ICD-10-CM

## 2024-08-22 NOTE — Progress Notes (Signed)
 Cardiac Rehab Medication Review   Does the patient  feel that his/her medications are working for him/her?  yes  Has the patient been experiencing any side effects to the medications prescribed?  no  Does the patient measure his/her own blood pressure or blood glucose at home?  yes   Does the patient have any problems obtaining medications due to transportation or finances?   no  Understanding of regimen: good Understanding of indications: good Potential of compliance: good    Comments: No questions regarding medications today.    Andrew Brock 08/22/2024 8:31 AM

## 2024-08-22 NOTE — Progress Notes (Signed)
 Cardiac Individual Treatment Plan  Patient Details  Name: Andrew Brock MRN: [id number] Date of Birth: [birth year] Referring Provider:   Flowsheet Row CARDIAC REHAB PHASE II ORIENTATION from 08/22/2024 in [facility]  Referring Provider Morene Brownie, MD    Initial Encounter Date:  Flowsheet Row CARDIAC REHAB PHASE II ORIENTATION from 08/22/2024 in [facility]  Date 08/22/24    Visit Diagnosis: 06/28/24 ST elevation myocardial infarction involving left anterior descending (LAD) coronary artery (HCC)  06/28/24 S/P PTCA (percutaneous transluminal coronary angioplasty)  Patient's Home Medications on Admission:  Current Outpatient Medications:    albuterol (VENTOLIN HFA) 108 (90 Base) MCG/ACT inhaler, Inhale 1 puff into the lungs every 4 (four) hours as needed for shortness of breath or wheezing., Disp: , Rfl:    amiodarone  (PACERONE ) 200 MG tablet, Take 200 mg twice daily for 14 days (until 8/11), than taken 200 mg once daily, Disp: 42 tablet, Rfl: 6   apixaban  (ELIQUIS ) 5 MG TABS tablet, Take 1 tablet (5 mg total) by mouth 2 (two) times daily., Disp: 60 tablet, Rfl: 6   aspirin  81 MG tablet, Take 1 tablet (81 mg total) by mouth daily., Disp: 30 tablet, Rfl: 0   atorvastatin  (LIPITOR ) 80 MG tablet, Take 80 mg by mouth daily., Disp: , Rfl:    colchicine  0.6 MG tablet, Take 1 tablet (0.6 mg total) by mouth daily., Disp: 30 tablet, Rfl: 3   digoxin  (LANOXIN ) 0.125 MG tablet, Take 1 tablet (0.125 mg total) by mouth daily., Disp: 30 tablet, Rfl: 6   empagliflozin  (JARDIANCE ) 10 MG TABS tablet, Take 1 tablet (10 mg total) by mouth daily., Disp: 30 tablet, Rfl: 6   insulin  glargine (LANTUS ) 100 UNIT/ML injection, Inject 34 Units into the skin daily., Disp: , Rfl:    metFORMIN (GLUCOPHAGE) 1000 MG tablet, Take 1,000 mg by mouth 2 (two) times daily with a meal., Disp: , Rfl:     metoprolol  succinate (TOPROL -XL) 25 MG 24 hr tablet, Take 1 tablet (25 mg total) by mouth daily. Take with or immediately following a meal., Disp: 90 tablet, Rfl: 3   spironolactone  (ALDACTONE ) 25 MG tablet, Take 1 tablet (25 mg total) by mouth daily., Disp: 30 tablet, Rfl: 6   torsemide  (DEMADEX ) 20 MG tablet, Take 1 tablet (20 mg total) by mouth daily., Disp: 30 tablet, Rfl: 6  Past Medical History: Past Medical History:  Diagnosis Date   Cancer (HCC)    hodgkin lymphoma 2005, treated in [place]   Diabetes mellitus (HCC)    Lateral meniscus tear    right knee   Plantar fasciitis     Tobacco Use: Social History   Tobacco Use  Smoking Status Never  Smokeless Tobacco Never    Labs: Review Flowsheet  More data exists      Latest Ref Rng & Units 07/04/2024 07/05/2024 07/06/2024 07/07/2024 07/08/2024  Labs for ITP Cardiac and Pulmonary Rehab  O2 Saturation % 62.9  56.2  59.8  61.2  55.7     Capillary Blood Glucose: Lab Results  Component Value Date   GLUCAP 139 (H) 07/08/2024   GLUCAP 119 (H) 07/08/2024   GLUCAP 173 (H) 07/07/2024   GLUCAP 162 (H) 07/07/2024   GLUCAP 157 (H) 07/07/2024     Exercise Target Goals: Exercise Program Goal: Individual exercise prescription set using results from initial 6 min walk test and THRR while considering  patient's activity barriers  and safety.   Exercise Prescription Goal: Initial exercise prescription builds to 30-45 minutes a day of aerobic activity, 2-3 days per week.  Home exercise guidelines will be given to patient during program as part of exercise prescription that the participant will acknowledge.  Activity Barriers & Risk Stratification:  Activity Barriers & Cardiac Risk Stratification - 08/22/24 0840       Activity Barriers & Cardiac Risk Stratification   Activity Barriers Joint Problems;Deconditioning;Muscular Weakness;Balance Concerns;Decreased Ventricular Function    Cardiac Risk Stratification High          6  Minute Walk:  6 Minute Walk     Row Name 08/22/24 0939         6 Minute Walk   Phase Initial     Distance 1253 feet     Walk Time 6 minutes     # of Rest Breaks 0     MPH 2.4     METS 2.43     RPE 6     Perceived Dyspnea  0     VO2 Peak 8.5     Symptoms Yes (comment)     Comments 5/10 chronic bilateral knee pain     Resting HR 93 bpm     Resting BP 112/60     Resting Oxygen Saturation  98 %     Exercise Oxygen Saturation  during 6 min walk 97 %     Max Ex. HR 107 bpm     Max Ex. BP 118/70     2 Minute Post BP 102/70        Oxygen Initial Assessment:   Oxygen Re-Evaluation:   Oxygen Discharge (Final Oxygen Re-Evaluation):   Initial Exercise Prescription:  Initial Exercise Prescription - 08/22/24 0800       Date of Initial Exercise RX and Referring Provider   Date 08/22/24    Referring Provider Morene Brownie, MD    Expected Discharge Date 10/21/24      NuStep   Level 1    SPM 75    Minutes 15    METs 2.4      Arm Ergometer   Level 1    Watts 25    RPM 60    Minutes 15    METs 2.4      Prescription Details   Frequency (times per week) 2    Duration Progress to 30 minutes of continuous aerobic without signs/symptoms of physical distress      Intensity   THRR 40-80% of Max Heartrate 64-128    Ratings of Perceived Exertion 11-13    Perceived Dyspnea 0-4      Progression   Progression Continue progressive overload as per policy without signs/symptoms or physical distress.      Resistance Training   Training Prescription Yes    Weight 4lbs    Reps 10-15          Perform Capillary Blood Glucose checks as needed.  Exercise Prescription Changes:   Exercise Comments:   Exercise Goals and Review:   Exercise Goals     Row Name 08/22/24 0751             Exercise Goals   Increase Physical Activity Yes       Intervention Provide advice, education, support and counseling about physical activity/exercise needs.;Develop an  individualized exercise prescription for aerobic and resistive training based on initial evaluation findings, risk stratification, comorbidities and participant's personal goals.       Expected Outcomes Short  Term: Attend rehab on a regular basis to increase amount of physical activity.;Long Term: Add in home exercise to make exercise part of routine and to increase amount of physical activity.;Long Term: Exercising regularly at least 3-5 days a week.       Increase Strength and Stamina Yes       Intervention Provide advice, education, support and counseling about physical activity/exercise needs.;Develop an individualized exercise prescription for aerobic and resistive training based on initial evaluation findings, risk stratification, comorbidities and participant's personal goals.       Expected Outcomes Short Term: Increase workloads from initial exercise prescription for resistance, speed, and METs.;Short Term: Perform resistance training exercises routinely during rehab and add in resistance training at home;Long Term: Improve cardiorespiratory fitness, muscular endurance and strength as measured by increased METs and functional capacity ( )       Able to understand and use rate of perceived exertion (RPE) scale Yes       Intervention Provide education and explanation on how to use RPE scale       Expected Outcomes Short Term: Able to use RPE daily in rehab to express subjective intensity level;Long Term:  Able to use RPE to guide intensity level when exercising independently       Knowledge and understanding of Target Heart Rate Range (THRR) Yes       Intervention Provide education and explanation of THRR including how the numbers were predicted and where they are located for reference       Expected Outcomes Short Term: Able to state/look up THRR;Short Term: Able to use daily as guideline for intensity in rehab;Long Term: Able to use THRR to govern intensity when exercising independently        Understanding of Exercise Prescription Yes       Intervention Provide education, explanation, and written materials on patient's individual exercise prescription       Expected Outcomes Short Term: Able to explain program exercise prescription;Long Term: Able to explain home exercise prescription to exercise independently          Exercise Goals Re-Evaluation :   Discharge Exercise Prescription (Final Exercise Prescription Changes):   Nutrition:  Target Goals: Understanding of nutrition guidelines, daily intake of sodium 1500mg , cholesterol 200mg , calories 30% from fat and 7% or less from saturated fats, daily to have 5 or more servings of fruits and vegetables.  Biometrics:  Pre Biometrics - 08/22/24 0800       Pre Biometrics   Waist Circumference 55 inches    Hip Circumference 51 inches    Waist to Hip Ratio 1.08 %    Triceps Skinfold 11 mm    % Body Fat 38.7 %    Grip Strength 40 kg    Flexibility 9 in    Single Leg Stand 8.68 seconds           Nutrition Therapy Plan and Nutrition Goals:   Nutrition Assessments:  MEDIFICTS Score Key: >=70 Need to make dietary changes  40-70 Heart Healthy Diet <= 40 Therapeutic Level Cholesterol Diet    Picture Your Plate Scores: <59 Unhealthy dietary pattern with much room for improvement. 41-50 Dietary pattern unlikely to meet recommendations for good health and room for improvement. 51-60 More healthful dietary pattern, with some room for improvement.  >60 Healthy dietary pattern, although there may be some specific behaviors that could be improved.    Nutrition Goals Re-Evaluation:   Nutrition Goals Re-Evaluation:   Nutrition Goals Discharge (Final Nutrition Goals Re-Evaluation):  Psychosocial: Target Goals: Acknowledge presence or absence of significant depression and/or stress, maximize coping skills, provide positive support system. Participant is able to verbalize types and ability to use techniques and  skills needed for reducing stress and depression.  Initial Review & Psychosocial Screening:  Initial Psych Review & Screening - 08/22/24 1142       Initial Review   Current issues with Current Depression;Current Sleep Concerns;Current Anxiety/Panic;Current Stress Concerns    Source of Stress Concerns Chronic Illness      Family Dynamics   Good Support System? Yes   has brother for support     Barriers   Psychosocial barriers to participate in program The patient should benefit from training in stress management and relaxation.      Screening Interventions   Interventions Encouraged to exercise;Other (comment)    Comments [facility] use of anitdepressant meds. Used in past and voices did not change the way he felt.    Expected Outcomes Short Term goal: Utilizing psychosocial counselor, staff and physician to assist with identification of specific Stressors or current issues interfering with healing process. Setting desired goal for each stressor or current issue identified.;Long Term Goal: Stressors or current issues are controlled or eliminated.;Short Term goal: Identification and review with participant of any Quality of Life or Depression concerns found by scoring the questionnaire.;Long Term goal: The participant improves quality of Life and PHQ9 Scores as seen by post scores and/or verbalization of changes          Quality of Life Scores:  Quality of Life - 08/22/24 1146       Quality of Life   Select Quality of Life      Quality of Life Scores   Health/Function Pre 24.6 %    Socioeconomic Pre 27.75 %    Psych/Spiritual Pre 25.57 %    Family Pre 30 %    GLOBAL Pre 26.08 %         Scores of 19 and below usually indicate a poorer quality of life in these areas.  A difference of  2-3 points is a clinically meaningful difference.  A difference of 2-3 points in the total score of the Quality of Life Index has been associated with significant  improvement in overall quality of life, self-image, physical symptoms, and general health in studies assessing change in quality of life.  PHQ-9: Review Flowsheet       08/22/2024  Depression screen PHQ 2/9  Decreased Interest 0  Down, Depressed, Hopeless 2  PHQ - 2 Score 2  Altered sleeping 1  Tired, decreased energy 0  Change in appetite 2  Feeling bad or failure about yourself  0  Trouble concentrating 0  Moving slowly or fidgety/restless 0  Suicidal thoughts 0  PHQ-9 Score 5  Difficult doing work/chores Not difficult at all   Interpretation of Total Score  Total Score Depression Severity:  1-4 = Minimal depression, 5-9 = Mild depression, 10-14 = Moderate depression, 15-19 = Moderately severe depression, 20-27 = Severe depression   Psychosocial Evaluation and Intervention:   Psychosocial Re-Evaluation:   Psychosocial Discharge (Final Psychosocial Re-Evaluation):   Vocational Rehabilitation: Provide vocational rehab assistance to qualifying candidates.   Vocational Rehab Evaluation & Intervention:  Vocational Rehab - 08/22/24 1145       Initial Vocational Rehab Evaluation & Intervention   Assessment shows need for Vocational Rehabilitation No   plans to return to his job when [place] cleared  Education: Education Goals: Education classes will be provided on a weekly basis, covering required topics. Participant will state understanding/return demonstration of topics presented.     Core Videos: Exercise    Move It!  Clinical staff conducted group or individual video education with verbal and written material and guidebook.  Patient learns the recommended Pritikin exercise program. Exercise with the goal of living a long, healthy life. Some of the health benefits of exercise include controlled diabetes, healthier blood pressure levels, improved cholesterol levels, improved heart and lung capacity, improved sleep, and better body composition. Everyone  should speak with their doctor before starting or changing an exercise routine.  Biomechanical Limitations Clinical staff conducted group or individual video education with verbal and written material and guidebook.  Patient learns how biomechanical limitations can impact exercise and how we can mitigate and possibly overcome limitations to have an impactful and balanced exercise routine.  Body Composition Clinical staff conducted group or individual video education with verbal and written material and guidebook.  Patient learns that body composition (ratio of muscle mass to fat mass) is a key component to assessing overall fitness, rather than body weight alone. Increased fat mass, especially visceral belly fat, can put us  at increased risk for metabolic syndrome, type 2 diabetes, heart disease, and even death. It is recommended to combine diet and exercise (cardiovascular and resistance training) to improve your body composition. Seek guidance from your physician and exercise physiologist before implementing an exercise routine.  Exercise Action Plan Clinical staff conducted group or individual video education with verbal and written material and guidebook.  Patient learns the recommended strategies to achieve and enjoy long-term exercise adherence, including variety, self-motivation, self-efficacy, and positive decision making. Benefits of exercise include fitness, good health, weight management, more energy, better sleep, less stress, and overall well-being.  Medical   Heart Disease Risk Reduction Clinical staff conducted group or individual video education with verbal and written material and guidebook.  Patient learns our heart is our most vital organ as it circulates oxygen, nutrients, white blood cells, and hormones throughout the entire body, and carries waste away. Data supports a plant-based eating plan like the Pritikin Program for its effectiveness in slowing progression of and  reversing heart disease. The video provides a number of recommendations to address heart disease.   Metabolic Syndrome and Belly Fat  Clinical staff conducted group or individual video education with verbal and written material and guidebook.  Patient learns what metabolic syndrome is, how it leads to heart disease, and how one can reverse it and keep it from coming back. You have metabolic syndrome if you have 3 of the following 5 criteria: abdominal obesity, high blood pressure, high triglycerides, low HDL cholesterol, and high blood sugar.  Hypertension and Heart Disease Clinical staff conducted group or individual video education with verbal and written material and guidebook.  Patient learns that high blood pressure, or hypertension, is very common in the [place] . Hypertension is largely due to excessive salt intake, but other important risk factors include being overweight, physical inactivity, drinking too much alcohol, smoking, and not eating enough potassium from fruits and vegetables. High blood pressure is a leading risk factor for heart attack, stroke, congestive heart failure, dementia, kidney failure, and premature death. Long-term effects of excessive salt intake include stiffening of the arteries and thickening of heart muscle and organ damage. Recommendations include ways to reduce hypertension and the risk of heart disease.  Diseases of Our Time - Focusing on Diabetes Clinical  staff conducted group or individual video education with verbal and written material and guidebook.  Patient learns why the best way to stop diseases of our time is prevention, through food and other lifestyle changes. Medicine (such as prescription pills and surgeries) is often only a Band-Aid on the problem, not a long-term solution. Most common diseases of our time include obesity, type 2 diabetes, hypertension, heart disease, and cancer. The Pritikin Program is recommended and has been proven to help  reduce, reverse, and/or prevent the damaging effects of metabolic syndrome.  Nutrition   Overview of the Pritikin Eating Plan  Clinical staff conducted group or individual video education with verbal and written material and guidebook.  Patient learns about the Pritikin Eating Plan for disease risk reduction. The Pritikin Eating Plan emphasizes a wide variety of unrefined, minimally-processed carbohydrates, like fruits, vegetables, whole grains, and legumes. Go, Caution, and Stop food choices are explained. Plant-based and lean animal proteins are emphasized. Rationale provided for low sodium intake for blood pressure control, low added sugars for blood sugar stabilization, and low added fats and oils for coronary artery disease risk reduction and weight management.  Calorie Density  Clinical staff conducted group or individual video education with verbal and written material and guidebook.  Patient learns about calorie density and how it impacts the Pritikin Eating Plan. Knowing the characteristics of the food you choose will help you decide whether those foods will lead to weight gain or weight loss, and whether you want to consume more or less of them. Weight loss is usually a side effect of the Pritikin Eating Plan because of its focus on low calorie-dense foods.  Label Reading  Clinical staff conducted group or individual video education with verbal and written material and guidebook.  Patient learns about the Pritikin recommended label reading guidelines and corresponding recommendations regarding calorie density, added sugars, sodium content, and whole grains.  Dining Out - Part 1  Clinical staff conducted group or individual video education with verbal and written material and guidebook.  Patient learns that restaurant meals can be sabotaging because they can be so high in calories, fat, sodium, and/or sugar. Patient learns recommended strategies on how to positively address this and avoid  unhealthy pitfalls.  Facts on Fats  Clinical staff conducted group or individual video education with verbal and written material and guidebook.  Patient learns that lifestyle modifications can be just as effective, if not more so, as many medications for lowering your risk of heart disease. A Pritikin lifestyle can help to reduce your risk of inflammation and atherosclerosis (cholesterol build-up, or plaque, in the artery walls). Lifestyle interventions such as dietary choices and physical activity address the cause of atherosclerosis. A review of the types of fats and their impact on blood cholesterol levels, along with dietary recommendations to reduce fat intake is also included.  Nutrition Action Plan  Clinical staff conducted group or individual video education with verbal and written material and guidebook.  Patient learns how to incorporate Pritikin recommendations into their lifestyle. Recommendations include planning and keeping personal health goals in mind as an important part of their success.  Healthy Mind-Set    Healthy Minds, Bodies, Hearts  Clinical staff conducted group or individual video education with verbal and written material and guidebook.  Patient learns how to identify when they are stressed. Video will discuss the impact of that stress, as well as the many benefits of stress management. Patient will also be introduced to stress management techniques. The way  we think, act, and feel has an impact on our hearts.  How Our Thoughts Can Heal Our Hearts  Clinical staff conducted group or individual video education with verbal and written material and guidebook.  Patient learns that negative thoughts can cause depression and anxiety. This can result in negative lifestyle behavior and serious health problems. Cognitive behavioral therapy is an effective method to help control our thoughts in order to change and improve our emotional outlook.  Additional Videos:  Exercise     Improving Performance  Clinical staff conducted group or individual video education with verbal and written material and guidebook.  Patient learns to use a non-linear approach by alternating intensity levels and lengths of time spent exercising to help burn more calories and lose more body fat. Cardiovascular exercise helps improve heart health, metabolism, hormonal balance, blood sugar control, and recovery from fatigue. Resistance training improves strength, endurance, balance, coordination, reaction time, metabolism, and muscle mass. Flexibility exercise improves circulation, posture, and balance. Seek guidance from your physician and exercise physiologist before implementing an exercise routine and learn your capabilities and proper form for all exercise.  Introduction to Yoga  Clinical staff conducted group or individual video education with verbal and written material and guidebook.  Patient learns about yoga, a discipline of the coming together of mind, breath, and body. The benefits of yoga include improved flexibility, improved range of motion, better posture and core strength, increased lung function, weight loss, and positive self-image. Yoga's heart health benefits include lowered blood pressure, healthier heart rate, decreased cholesterol and triglyceride levels, improved immune function, and reduced stress. Seek guidance from your physician and exercise physiologist before implementing an exercise routine and learn your capabilities and proper form for all exercise.  Medical   Aging: Enhancing Your Quality of Life  Clinical staff conducted group or individual video education with verbal and written material and guidebook.  Patient learns key strategies and recommendations to stay in good physical health and enhance quality of life, such as prevention strategies, having an advocate, securing a Health Care Proxy and Power of Attorney, and keeping a list of medications and system for  tracking them. It also discusses how to avoid risk for bone loss.  Biology of Weight Control  Clinical staff conducted group or individual video education with verbal and written material and guidebook.  Patient learns that weight gain occurs because we consume more calories than we burn (eating more, moving less). Even if your body weight is normal, you may have higher ratios of fat compared to muscle mass. Too much body fat puts you at increased risk for cardiovascular disease, heart attack, stroke, type 2 diabetes, and obesity-related cancers. In addition to exercise, following the Pritikin Eating Plan can help reduce your risk.  Decoding Lab Results  Clinical staff conducted group or individual video education with verbal and written material and guidebook.  Patient learns that lab test reflects one measurement whose values change over time and are influenced by many factors, including medication, stress, sleep, exercise, food, hydration, pre-existing medical conditions, and more. It is recommended to use the knowledge from this video to become more involved with your lab results and evaluate your numbers to speak with your doctor.   Diseases of Our Time - Overview  Clinical staff conducted group or individual video education with verbal and written material and guidebook.  Patient learns that according to the CDC, 50% to 70% of chronic diseases (such as obesity, type 2 diabetes, elevated lipids, hypertension, and heart  disease) are avoidable through lifestyle improvements including healthier food choices, listening to satiety cues, and increased physical activity.  Sleep Disorders Clinical staff conducted group or individual video education with verbal and written material and guidebook.  Patient learns how good quality and duration of sleep are important to overall health and well-being. Patient also learns about sleep disorders and how they impact health along with recommendations to address  them, including discussing with a physician.  Nutrition  Dining Out - Part 2 Clinical staff conducted group or individual video education with verbal and written material and guidebook.  Patient learns how to plan ahead and communicate in order to maximize their dining experience in a healthy and nutritious manner. Included are recommended food choices based on the type of restaurant the patient is visiting.   Fueling a Banker conducted group or individual video education with verbal and written material and guidebook.  There is a strong connection between our food choices and our health. Diseases like obesity and type 2 diabetes are very prevalent and are in large-part due to lifestyle choices. The Pritikin Eating Plan provides plenty of food and hunger-curbing satisfaction. It is easy to follow, affordable, and helps reduce health risks.  Menu Workshop  Clinical staff conducted group or individual video education with verbal and written material and guidebook.  Patient learns that restaurant meals can sabotage health goals because they are often packed with calories, fat, sodium, and sugar. Recommendations include strategies to plan ahead and to communicate with the manager, chef, or server to help order a healthier meal.  Planning Your Eating Strategy  Clinical staff conducted group or individual video education with verbal and written material and guidebook.  Patient learns about the Pritikin Eating Plan and its benefit of reducing the risk of disease. The Pritikin Eating Plan does not focus on calories. Instead, it emphasizes high-quality, nutrient-rich foods. By knowing the characteristics of the foods, we choose, we can determine their calorie density and make informed decisions.  Targeting Your Nutrition Priorities  Clinical staff conducted group or individual video education with verbal and written material and guidebook.  Patient learns that lifestyle habits have  a tremendous impact on disease risk and progression. This video provides eating and physical activity recommendations based on your personal health goals, such as reducing LDL cholesterol, losing weight, preventing or controlling type 2 diabetes, and reducing high blood pressure.  Vitamins and Minerals  Clinical staff conducted group or individual video education with verbal and written material and guidebook.  Patient learns different ways to obtain key vitamins and minerals, including through a recommended healthy diet. It is important to discuss all supplements you take with your doctor.   Healthy Mind-Set    Smoking Cessation  Clinical staff conducted group or individual video education with verbal and written material and guidebook.  Patient learns that cigarette smoking and tobacco addiction pose a serious health risk which affects millions of people. Stopping smoking will significantly reduce the risk of heart disease, lung disease, and many forms of cancer. Recommended strategies for quitting are covered, including working with your doctor to develop a successful plan.  Culinary   Becoming a Set designer conducted group or individual video education with verbal and written material and guidebook.  Patient learns that cooking at home can be healthy, cost-effective, quick, and puts them in control. Keys to cooking healthy recipes will include looking at your recipe, assessing your equipment needs, planning ahead, making it simple,  choosing cost-effective seasonal ingredients, and limiting the use of added fats, salts, and sugars.  Cooking - Breakfast and Snacks  Clinical staff conducted group or individual video education with verbal and written material and guidebook.  Patient learns how important breakfast is to satiety and nutrition through the entire day. Recommendations include key foods to eat during breakfast to help stabilize blood sugar levels and to prevent  overeating at meals later in the day. Planning ahead is also a key component.  Cooking - Educational psychologist conducted group or individual video education with verbal and written material and guidebook.  Patient learns eating strategies to improve overall health, including an approach to cook more at home. Recommendations include thinking of animal protein as a side on your plate rather than center stage and focusing instead on lower calorie dense options like vegetables, fruits, whole grains, and plant-based proteins, such as beans. Making sauces in large quantities to freeze for later and leaving the skin on your vegetables are also recommended to maximize your experience.  Cooking - Healthy Salads and Dressing Clinical staff conducted group or individual video education with verbal and written material and guidebook.  Patient learns that vegetables, fruits, whole grains, and legumes are the foundations of the Pritikin Eating Plan. Recommendations include how to incorporate each of these in flavorful and healthy salads, and how to create homemade salad dressings. Proper handling of ingredients is also covered. Cooking - Soups and [employer] - Soups and Desserts Clinical staff conducted group or individual video education with verbal and written material and guidebook.  Patient learns that Pritikin soups and desserts make for easy, nutritious, and delicious snacks and meal components that are low in sodium, fat, sugar, and calorie density, while high in vitamins, minerals, and filling fiber. Recommendations include simple and healthy ideas for soups and desserts.   Overview     The Pritikin Solution Program Overview Clinical staff conducted group or individual video education with verbal and written material and guidebook.  Patient learns that the results of the Pritikin Program have been documented in more than 100 articles published in peer-reviewed journals, and the benefits  include reducing risk factors for (and, in some cases, even reversing) high cholesterol, high blood pressure, type 2 diabetes, obesity, and more! An overview of the three key pillars of the Pritikin Program will be covered: eating well, doing regular exercise, and having a healthy mind-set.  WORKSHOPS  Exercise: Exercise Basics: Building Your Action Plan Clinical staff led group instruction and group discussion with PowerPoint presentation and patient guidebook. To enhance the learning environment the use of posters, models and videos may be added. At the conclusion of this workshop, patients will comprehend the difference between physical activity and exercise, as well as the benefits of incorporating both, into their routine. Patients will understand the FITT (Frequency, Intensity, Time, and Type) principle and how to use it to build an exercise action plan. In addition, safety concerns and other considerations for exercise and cardiac rehab will be addressed by the presenter. The purpose of this lesson is to promote a comprehensive and effective weekly exercise routine in order to improve patients' overall level of fitness.   Managing Heart Disease: Your Path to a Healthier Heart Clinical staff led group instruction and group discussion with PowerPoint presentation and patient guidebook. To enhance the learning environment the use of posters, models and videos may be added.At the conclusion of this workshop, patients will understand the anatomy and physiology  of the heart. Additionally, they will understand how Pritikin's three pillars impact the risk factors, the progression, and the management of heart disease.  The purpose of this lesson is to provide a high-level overview of the heart, heart disease, and how the Pritikin lifestyle positively impacts risk factors.  Exercise Biomechanics Clinical staff led group instruction and group discussion with PowerPoint presentation and patient  guidebook. To enhance the learning environment the use of posters, models and videos may be added. Patients will learn how the structural parts of their bodies function and how these functions impact their daily activities, movement, and exercise. Patients will learn how to promote a neutral spine, learn how to manage pain, and identify ways to improve their physical movement in order to promote healthy living. The purpose of this lesson is to expose patients to common physical limitations that impact physical activity. Participants will learn practical ways to adapt and manage aches and pains, and to minimize their effect on regular exercise. Patients will learn how to maintain good posture while sitting, walking, and lifting.  Balance Training and Fall Prevention  Clinical staff led group instruction and group discussion with PowerPoint presentation and patient guidebook. To enhance the learning environment the use of posters, models and videos may be added. At the conclusion of this workshop, patients will understand the importance of their sensorimotor skills (vision, proprioception, and the vestibular system) in maintaining their ability to balance as they age. Patients will apply a variety of balancing exercises that are appropriate for their current level of function. Patients will understand the common causes for poor balance, possible solutions to these problems, and ways to modify their physical environment in order to minimize their fall risk. The purpose of this lesson is to teach patients about the importance of maintaining balance as they age and ways to minimize their risk of falling.  WORKSHOPS   Nutrition:  Fueling a Ship broker led group instruction and group discussion with PowerPoint presentation and patient guidebook. To enhance the learning environment the use of posters, models and videos may be added. Patients will review the foundational principles of the  Pritikin Eating Plan and understand what constitutes a serving size in each of the food groups. Patients will also learn Pritikin-friendly foods that are better choices when away from home and review make-ahead meal and snack options. Calorie density will be reviewed and applied to three nutrition priorities: weight maintenance, weight loss, and weight gain. The purpose of this lesson is to reinforce (in a group setting) the key concepts around what patients are recommended to eat and how to apply these guidelines when away from home by planning and selecting Pritikin-friendly options. Patients will understand how calorie density may be adjusted for different weight management goals.  Mindful Eating  Clinical staff led group instruction and group discussion with PowerPoint presentation and patient guidebook. To enhance the learning environment the use of posters, models and videos may be added. Patients will briefly review the concepts of the Pritikin Eating Plan and the importance of low-calorie dense foods. The concept of mindful eating will be introduced as well as the importance of paying attention to internal hunger signals. Triggers for non-hunger eating and techniques for dealing with triggers will be explored. The purpose of this lesson is to provide patients with the opportunity to review the basic principles of the Pritikin Eating Plan, discuss the value of eating mindfully and how to measure internal cues of hunger and fullness using the Hunger Scale.  Patients will also discuss reasons for non-hunger eating and learn strategies to use for controlling emotional eating.  Targeting Your Nutrition Priorities Clinical staff led group instruction and group discussion with PowerPoint presentation and patient guidebook. To enhance the learning environment the use of posters, models and videos may be added. Patients will learn how to determine their genetic susceptibility to disease by reviewing their  family history. Patients will gain insight into the importance of diet as part of an overall healthy lifestyle in mitigating the impact of genetics and other environmental insults. The purpose of this lesson is to provide patients with the opportunity to assess their personal nutrition priorities by looking at their family history, their own health history and current risk factors. Patients will also be able to discuss ways of prioritizing and modifying the Pritikin Eating Plan for their highest risk areas  Menu  Clinical staff led group instruction and group discussion with PowerPoint presentation and patient guidebook. To enhance the learning environment the use of posters, models and videos may be added. Using menus brought in from [employer], or printed from [employer], patients will apply the Pritikin dining out guidelines that were presented in the [employer] video. Patients will also be able to practice these guidelines in a variety of provided scenarios. The purpose of this lesson is to provide patients with the opportunity to practice hands-on learning of the Pritikin Dining Out guidelines with actual menus and practice scenarios.  Label Reading Clinical staff led group instruction and group discussion with PowerPoint presentation and patient guidebook. To enhance the learning environment the use of posters, models and videos may be added. Patients will review and discuss the Pritikin label reading guidelines presented in Pritikin's Label Reading Educational series video. Using fool labels brought in from local grocery stores and markets, patients will apply the label reading guidelines and determine if the packaged food meet the Pritikin guidelines. The purpose of this lesson is to provide patients with the opportunity to review, discuss, and practice hands-on learning of the Pritikin Label Reading guidelines with actual packaged food labels. Cooking School  Pritikin's  [employer] are designed to teach patients ways to prepare quick, simple, and affordable recipes at home. The importance of nutrition's role in chronic disease risk reduction is reflected in its emphasis in the overall Pritikin program. By learning how to prepare essential core Pritikin Eating Plan recipes, patients will increase control over what they eat; be able to customize the flavor of foods without the use of added salt, sugar, or fat; and improve the quality of the food they consume. By learning a set of core recipes which are easily assembled, quickly prepared, and affordable, patients are more likely to prepare more healthy foods at home. These workshops focus on convenient breakfasts, simple entres, side dishes, and desserts which can be prepared with minimal effort and are consistent with nutrition recommendations for cardiovascular risk reduction. Cooking [employer] are taught by a Armed forces logistics/support/administrative officer (RD) who has been trained by the [employer]. The chef or RD has a clear understanding of the importance of minimizing - if not completely eliminating - added fat, sugar, and sodium in recipes. Throughout the series of Cooking School Workshop sessions, patients will learn about healthy ingredients and efficient methods of cooking to build confidence in their capability to prepare    Cooking School weekly topics:  Adding Flavor- Sodium-Free  Fast and Healthy Breakfasts  Powerhouse Plant-Based Proteins  Satisfying  Salads and Dressings  Simple Sides and Sauces  International Cuisine-Spotlight on the [employer] Zones  Delicious Desserts  Savory Soups  [employer] - Meals in a Snap  Tasty Appetizers and Snacks  Comforting Weekend Breakfasts  One-Pot Wonders   Fast Evening Meals  Landscape architect Your Pritikin Plate  WORKSHOPS   Healthy Mindset (Psychosocial):  Focused Goals, Sustainable Changes Clinical staff led group  instruction and group discussion with PowerPoint presentation and patient guidebook. To enhance the learning environment the use of posters, models and videos may be added. Patients will be able to apply effective goal setting strategies to establish at least one personal goal, and then take consistent, meaningful action toward that goal. They will learn to identify common barriers to achieving personal goals and develop strategies to overcome them. Patients will also gain an understanding of how our mind-set can impact our ability to achieve goals and the importance of cultivating a positive and growth-oriented mind-set. The purpose of this lesson is to provide patients with a deeper understanding of how to set and achieve personal goals, as well as the tools and strategies needed to overcome common obstacles which may arise along the way.  From Head to Heart: The Power of a Healthy Outlook  Clinical staff led group instruction and group discussion with PowerPoint presentation and patient guidebook. To enhance the learning environment the use of posters, models and videos may be added. Patients will be able to recognize and describe the impact of emotions and mood on physical health. They will discover the importance of self-care and explore self-care practices which may work for them. Patients will also learn how to utilize the 4 C's to cultivate a healthier outlook and better manage stress and challenges. The purpose of this lesson is to demonstrate to patients how a healthy outlook is an essential part of maintaining good health, especially as they continue their cardiac rehab journey.  Healthy Sleep for a Healthy Heart Clinical staff led group instruction and group discussion with PowerPoint presentation and patient guidebook. To enhance the learning environment the use of posters, models and videos may be added. At the conclusion of this workshop, patients will be able to demonstrate knowledge of the  importance of sleep to overall health, well-being, and quality of life. They will understand the symptoms of, and treatments for, common sleep disorders. Patients will also be able to identify daytime and nighttime behaviors which impact sleep, and they will be able to apply these tools to help manage sleep-related challenges. The purpose of this lesson is to provide patients with a general overview of sleep and outline the importance of quality sleep. Patients will learn about a few of the most common sleep disorders. Patients will also be introduced to the concept of "sleep hygiene," and discover ways to self-manage certain sleeping problems through simple daily behavior changes. Finally, the workshop will motivate patients by clarifying the links between quality sleep and their goals of heart-healthy living.   Recognizing and Reducing Stress Clinical staff led group instruction and group discussion with PowerPoint presentation and patient guidebook. To enhance the learning environment the use of posters, models and videos may be added. At the conclusion of this workshop, patients will be able to understand the types of stress reactions, differentiate between acute and chronic stress, and recognize the impact that chronic stress has on their health. They will also be able to apply different coping mechanisms, such as reframing negative self-talk. Patients will have the opportunity to  practice a variety of stress management techniques, such as deep abdominal breathing, progressive muscle relaxation, and/or guided imagery.  The purpose of this lesson is to educate patients on the role of stress in their lives and to provide healthy techniques for coping with it.  Learning Barriers/Preferences:  Learning Barriers/Preferences - 08/22/24 1144       Learning Barriers/Preferences   Learning Barriers Sight    Learning Preferences Audio;Skilled Demonstration;Computer/Internet;Verbal Instruction;Group  Instruction;Written Material;Video;Individual Instruction;Pictoral          Education Topics:  Knowledge Questionnaire Score:  Knowledge Questionnaire Score - 08/22/24 1144       Knowledge Questionnaire Score   Pre Score 22/24          Core Components/Risk Factors/Patient Goals at Admission:  Personal Goals and Risk Factors at Admission - 08/22/24 1145       Core Components/Risk Factors/Patient Goals on Admission    Weight Management Yes;Obesity;Weight Loss    Intervention Weight Management: Develop a combined nutrition and exercise program designed to reach desired caloric intake, while maintaining appropriate intake of nutrient and fiber, sodium and fats, and appropriate energy expenditure required for the weight goal.;Weight Management: Provide education and appropriate resources to help participant work on and attain dietary goals.;Weight Management/Obesity: Establish reasonable short term and long term weight goals.;Obesity: Provide education and appropriate resources to help participant work on and attain dietary goals.    Admit Weight 289 lb 7.4 oz (131.3 kg)    Expected Outcomes Short Term: Continue to assess and modify interventions until short term weight is achieved;Long Term: Adherence to nutrition and physical activity/exercise program aimed toward attainment of established weight goal;Weight Loss: Understanding of general recommendations for a balanced deficit meal plan, which promotes 1-2 lb weight loss per week and includes a negative energy balance of [phone] kcal/d;Understanding recommendations for meals to include 15-35% energy as protein, 25-35% energy from fat, 35-60% energy from carbohydrates, less than 200mg  of dietary cholesterol, 20-35 gm of total fiber daily;Understanding of distribution of calorie intake throughout the day with the consumption of 4-5 meals/snacks    Diabetes Yes    Intervention Provide education about signs/symptoms and action to take for  hypo/hyperglycemia.;Provide education about proper nutrition, including hydration, and aerobic/resistive exercise prescription along with prescribed medications to achieve blood glucose in normal ranges: Fasting glucose 65-99 mg/dL    Expected Outcomes Short Term: Participant verbalizes understanding of the signs/symptoms and immediate care of hyper/hypoglycemia, proper foot care and importance of medication, aerobic/resistive exercise and nutrition plan for blood glucose control.;Long Term: Attainment of HbA1C < 7%.    Heart Failure Yes    Intervention Provide a combined exercise and nutrition program that is supplemented with education, support and counseling about heart failure. Directed toward relieving symptoms such as shortness of breath, decreased exercise tolerance, and extremity edema.    Expected Outcomes Improve functional capacity of life;Short term: Attendance in program 2-3 days a week with increased exercise capacity. Reported lower sodium intake. Reported increased fruit and vegetable intake. Reports medication compliance.;Short term: Daily weights obtained and reported for increase. Utilizing diuretic protocols set by physician.;Long term: Adoption of self-care skills and reduction of barriers for early signs and symptoms recognition and intervention leading to self-care maintenance.    Hypertension Yes    Intervention Provide education on lifestyle modifcations including regular physical activity/exercise, weight management, moderate sodium restriction and increased consumption of fresh fruit, vegetables, and low fat dairy, alcohol moderation, and smoking cessation.;Monitor prescription use compliance.    Expected Outcomes Short  Term: Continued assessment and intervention until BP is < 140/73mm HG in hypertensive participants. < 130/62mm HG in hypertensive participants with diabetes, heart failure or chronic kidney disease.;Long Term: Maintenance of blood pressure at goal levels.    Lipids  Yes    Intervention Provide education and support for participant on nutrition & aerobic/resistive exercise along with prescribed medications to achieve LDL 70mg , HDL >40mg .    Expected Outcomes Short Term: Participant states understanding of desired cholesterol values and is compliant with medications prescribed. Participant is following exercise prescription and nutrition guidelines.;Long Term: Cholesterol controlled with medications as prescribed, with individualized exercise RX and with personalized nutrition plan. Value goals: LDL < 70mg , HDL > 40 mg.    Stress Yes    Intervention Offer individual and/or small group education and counseling on adjustment to heart disease, stress management and health-related lifestyle change. Teach and support self-help strategies.;Refer participants experiencing significant psychosocial distress to appropriate mental health specialists for further evaluation and treatment. When possible, include family members and significant others in education/counseling sessions.    Expected Outcomes Short Term: Participant demonstrates changes in health-related behavior, relaxation and other stress management skills, ability to obtain effective social support, and compliance with psychotropic medications if prescribed.;Long Term: Emotional wellbeing is indicated by absence of clinically significant psychosocial distress or social isolation.          Core Components/Risk Factors/Patient Goals Review:    Core Components/Risk Factors/Patient Goals at Discharge (Final Review):    ITP Comments:  ITP Comments     Row Name 08/22/24 0751           ITP Comments Wilbert Bihari, MD: Medical Director. Intorduction to the [facility] / Intensive Cardiac Rehab. Initial orientation packet reviewed with the patient.          Comments: Participant attended orientation for the cardiac rehabilitation program on  08/22/2024  to perform initial intake and exercise walk  test. Patient introduced to the [facility] education and orientation packet was reviewed. Completed 6-minute walk test, measurements, initial ITP, and exercise prescription. Vital signs stable. Telemetry-normal sinus rhythm, asymptomatic.   Service time was from 7:45 to 9:55.

## 2024-08-26 ENCOUNTER — Encounter (HOSPITAL_COMMUNITY)
Admission: RE | Admit: 2024-08-26 | Discharge: 2024-08-26 | Disposition: A | Discharge status: Home / Self Care | Source: Ambulatory Visit | Attending: Cardiology

## 2024-08-26 DIAGNOSIS — Z48812 Encounter for surgical aftercare following surgery on the circulatory system: Secondary | ICD-10-CM | POA: Diagnosis not present

## 2024-08-26 DIAGNOSIS — I2102 ST elevation (STEMI) myocardial infarction involving left anterior descending coronary artery: Secondary | ICD-10-CM

## 2024-08-26 DIAGNOSIS — Z9861 Coronary angioplasty status: Secondary | ICD-10-CM

## 2024-08-26 LAB — GLUCOSE, CAPILLARY
Glucose-Capillary: 171 mg/dL — ABNORMAL HIGH (ref 70–99)
Glucose-Capillary: 149 mg/dL — ABNORMAL HIGH (ref 70–99)

## 2024-08-26 NOTE — Progress Notes (Signed)
 Daily Session Note  Patient Details  Name: Andrew Brock MRN: [id number] Date of Birth: 1961-07-08 Referring Provider:   Flowsheet Row CARDIAC REHAB PHASE II ORIENTATION from 08/22/2024 in [facility]  Referring Provider Morene Brownie, MD    Encounter Date: 08/26/2024  Check In:  Session Check In - 08/26/24 0819       Check-In   Supervising physician immediately available to respond to emergencies [facility] MD immediately available    Physician(s) Rosabel Mose, NP    Location [facility]    Staff Present Alm Parkins, MS, ACSM-CEP, CCRP, Exercise Physiologist;Clista Rainford, RN, Valere Music, RN, Avonne Gal, MS, ACSM-CEP, Exercise Physiologist;Johnny Fayette, MS, Exercise Physiologist    Virtual Visit No    Medication changes reported     No    Fall or balance concerns reported    No    Tobacco Cessation No Change    Warm-up and Cool-down Performed as group-led instruction   CRP2 orientation today   Resistance Training Performed Yes    VAD Patient? No    PAD/SET Patient? No      Pain Assessment   Currently in Pain? No/denies    Pain Score 0-No pain    Pain Onset More than a month ago    Multiple Pain Sites No          Capillary Blood Glucose: Results for orders placed or performed during the hospital encounter of 08/26/24 (from the past 24 hours)  Glucose, capillary     Status: Abnormal   Collection Time: 08/26/24  9:02 AM  Result Value Ref Range   Glucose-Capillary 171 (H) 70 - 99 mg/dL  Glucose, capillary     Status: Abnormal   Collection Time: 08/26/24  9:58 AM  Result Value Ref Range   Glucose-Capillary 149 (H) 70 - 99 mg/dL     Exercise Prescription Changes - 08/26/24 1200       Response to Exercise   Blood Pressure (Admit) 114/64    Blood Pressure (Exercise) 108/78    Blood Pressure (Exit) 110/60    Heart Rate (Admit) 90 bpm    Heart Rate (Exercise) 113 bpm    Heart  Rate (Exit) 99 bpm    Rating of Perceived Exertion (Exercise) 7    Symptoms None    Comments Pt's first day in the CRP2 program    Duration Continue with 30 min of aerobic exercise without signs/symptoms of physical distress.    Intensity THRR unchanged      Progression   Progression Continue to progress workloads to maintain intensity without signs/symptoms of physical distress.    Average METs 1.7      Resistance Training   Training Prescription Yes    Weight 4lbs    Reps 10-15    Time 5 Minutes      Interval Training   Interval Training No      NuStep   Level 2    SPM 72    Minutes 15    METs 1.8      Arm Ergometer   Level 2    Watts 16    RPM 45    Minutes 15    METs 1.6          Social History   Tobacco Use  Smoking Status Never  Smokeless Tobacco Never    Goals Met:  Exercise tolerated well No report of concerns or symptoms today Strength training completed  today  Goals Unmet:  Not Applicable  Comments: Andrew Brock started cardiac rehab today.  Pt tolerated light exercise without difficulty. VSS, telemetry-Sinus Rhythm, asymptomatic.  Medication list reconciled. Pt denies barriers to medicaiton compliance.  PSYCHOSOCIAL ASSESSMENT:  PHQ-5. Andrew Brock mentioned that his car is not working. Given website to [email or web address] for assistance with transportation.    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   Pt oriented to exercise equipment and routine.    Understanding verbalized. Andrew Elpidio Quan RN BSN    Dr. Wilbert Bihari is Medical Director for Cardiac Rehab at [facility].

## 2024-08-28 ENCOUNTER — Encounter (HOSPITAL_COMMUNITY)
Admission: RE | Admit: 2024-08-28 | Discharge: 2024-08-28 | Disposition: A | Discharge status: Home / Self Care | Source: Ambulatory Visit | Attending: Cardiology

## 2024-08-28 DIAGNOSIS — Z9861 Coronary angioplasty status: Secondary | ICD-10-CM

## 2024-08-28 DIAGNOSIS — I2102 ST elevation (STEMI) myocardial infarction involving left anterior descending coronary artery: Secondary | ICD-10-CM

## 2024-08-28 DIAGNOSIS — Z48812 Encounter for surgical aftercare following surgery on the circulatory system: Secondary | ICD-10-CM | POA: Diagnosis not present

## 2024-08-28 LAB — GLUCOSE, CAPILLARY
Glucose-Capillary: 175 mg/dL — ABNORMAL HIGH (ref 70–99)
Glucose-Capillary: 181 mg/dL — ABNORMAL HIGH (ref 70–99)

## 2024-09-02 ENCOUNTER — Encounter (HOSPITAL_COMMUNITY)
Admission: RE | Admit: 2024-09-02 | Discharge: 2024-09-02 | Disposition: A | Discharge status: Home / Self Care | Source: Ambulatory Visit | Attending: Cardiology | Admitting: Cardiology

## 2024-09-02 ENCOUNTER — Telehealth (HOSPITAL_COMMUNITY): Payer: Self-pay | Admitting: Cardiology

## 2024-09-02 DIAGNOSIS — I2102 ST elevation (STEMI) myocardial infarction involving left anterior descending coronary artery: Secondary | ICD-10-CM

## 2024-09-02 DIAGNOSIS — Z48812 Encounter for surgical aftercare following surgery on the circulatory system: Secondary | ICD-10-CM | POA: Diagnosis not present

## 2024-09-02 DIAGNOSIS — Z9861 Coronary angioplasty status: Secondary | ICD-10-CM

## 2024-09-04 ENCOUNTER — Encounter (HOSPITAL_COMMUNITY)
Admission: RE | Admit: 2024-09-04 | Discharge: 2024-09-04 | Disposition: A | Discharge status: Home / Self Care | Source: Ambulatory Visit | Attending: Cardiology | Admitting: Cardiology

## 2024-09-04 DIAGNOSIS — Z48812 Encounter for surgical aftercare following surgery on the circulatory system: Secondary | ICD-10-CM | POA: Diagnosis not present

## 2024-09-04 DIAGNOSIS — Z9861 Coronary angioplasty status: Secondary | ICD-10-CM

## 2024-09-04 DIAGNOSIS — I2102 ST elevation (STEMI) myocardial infarction involving left anterior descending coronary artery: Secondary | ICD-10-CM

## 2024-09-04 NOTE — Progress Notes (Addendum)
 Patient had a 2 minute run of bigeminy. Asymptomatic now but was upset before exercise started. Blood pressure 106/70 heart rate 92. Will notify onsite provider Lum Louis NP. Onsite provider Lum Louis NP notified. No new order received. Will continue to monitor the patient throughout  the program.Vinal Rosengrant Elpidio Quan RN BSN

## 2024-09-04 NOTE — Progress Notes (Signed)
 Patient tearful this morning. Received paper work in the mail from [employer]. Worried about bills pilling up needs paperwork filled out to receive his short term disability. Emotional support provided. The heart failure was called. Left message with social work line. Andrew Brock is going to go by the heart failure clinic after exercise today.[name] RN BSN

## 2024-09-05 ENCOUNTER — Encounter (HOSPITAL_COMMUNITY)

## 2024-09-05 ENCOUNTER — Telehealth (HOSPITAL_COMMUNITY): Payer: Self-pay | Admitting: Licensed Clinical Social Worker

## 2024-09-05 NOTE — Telephone Encounter (Signed)
 H&V Care Navigation CSW Progress Note  Clinical Social Worker received VM from cardiac rehab informing CSW of pt current financial concerns as STD has paused.  Pt brought paperwork to clinic yesterday for completion so awaiting that to hopefully restart his benefits.  Was provided some food from cardiac rehab food pantry- pt reports he makes too much for food stamps.  Reports he can get what he needs and doesn't really have an appetite these days so doesn't eat much.  Is a little worried about bills if he cannot get STD restarted.  Isn't at risk of any turn offs at this time.  CSW will provide list of community resources and encouraged him to ask VA regarding assistance- is active with VA for mental health so will plan to ask if any financial assistance available.  Does not meet criteria for patient care fund assistance.  Car broke down so has been taking the bus.  Reports no issues with taking the bus and denied need for assistance with a taxi.    Pt reports no further needs at this time.  SDOH Screenings   Food Insecurity: No Food Insecurity (07/08/2024)  Housing: Low Risk  (07/08/2024)  Transportation Needs: No Transportation Needs (07/08/2024)  Utilities: Not At Risk (07/08/2024)  Depression (PHQ2-9): Medium Risk (08/22/2024)  Social Connections: Unknown (04/25/2022)   Received from [facility]  Tobacco Use: Low Risk  (07/16/2024)   Andriette HILARIO Leech, LCSW Clinical Social Worker [facility] Desk#: [phone] Cell#: [phone]

## 2024-09-06 ENCOUNTER — Telehealth (HOSPITAL_COMMUNITY): Payer: Self-pay

## 2024-09-06 NOTE — Telephone Encounter (Signed)
 Called to confirm/remind patient of their appointment at the [facility] on 09/09/24.   Appointment:   [x] Confirmed  [] Left mess   [] No answer/No voice mail  [] VM Full/unable to leave message  [] Phone not in service  Patient reminded to bring all medications and/or complete list.  Confirmed patient has transportation. Gave directions, instructed to utilize valet parking.

## 2024-09-06 NOTE — Progress Notes (Signed)
 ADVANCED HF CLINIC NOTE  Primary Care: [facility] Cardiologist: Dr. Zenaida  HPI: 63 y.o. male with a PMH of non-Hodgkin's lymphoma, HFrEF (new diagnosis 7/25), CAD s/p STEMI c/b LAD perforations and tamponade.   Presented to [facility] as STEMI to LAD 06/28/24. Emergent PCI complicated by distal LAD perforation s/p balloon tamponade. Subsequently developed cardiogenic shock and tamponade s/p inotrope support and pericardiocentesis. Developed atrial fibrillation RVR and self converted on IV amio. Echo showed severe biventricular heart failure with EF 20-25% with severely reduced RV. Eliquis  started prior to discharge.   Today he returns for HF follow up. Overall feeling fine. Doing well with cardiac rehab. Enjoys Environmental manager. Denies  palpitations, abnormal bleeding, CP, dizziness, edema, or PND/Orthopnea. Appetite ok. Weight at home 283 pounds. Taking all medications. Works as Aeronautical engineer at [employer]. Asking to return to work.   Past Medical History:  Diagnosis Date   Cancer (HCC)    hodgkin lymphoma 2005, treated in [place]   Diabetes mellitus (HCC)    Lateral meniscus tear    right knee   Plantar fasciitis    Current Outpatient Medications  Medication Sig Dispense Refill   albuterol (VENTOLIN HFA) 108 (90 Base) MCG/ACT inhaler Inhale 1 puff into the lungs every 4 (four) hours as needed for shortness of breath or wheezing.     amiodarone  (PACERONE ) 200 MG tablet Take 200 mg twice daily for 14 days (until 8/11), than taken 200 mg once daily 42 tablet 6   apixaban  (ELIQUIS ) 5 MG TABS tablet Take 1 tablet (5 mg total) by mouth 2 (two) times daily. 60 tablet 6   aspirin  81 MG tablet Take 1 tablet (81 mg total) by mouth daily. 30 tablet 0   atorvastatin  (LIPITOR ) 80 MG tablet Take 80 mg by mouth daily.     colchicine  0.6 MG tablet Take 1 tablet (0.6 mg total) by mouth daily. 30 tablet 3   digoxin  (LANOXIN ) 0.125 MG tablet Take 1 tablet  (0.125 mg total) by mouth daily. 30 tablet 6   empagliflozin  (JARDIANCE ) 10 MG TABS tablet Take 1 tablet (10 mg total) by mouth daily. 30 tablet 6   insulin  glargine (LANTUS ) 100 UNIT/ML injection Inject 34 Units into the skin daily.     metFORMIN (GLUCOPHAGE) 1000 MG tablet Take 1,000 mg by mouth 2 (two) times daily with a meal.     metoprolol  succinate (TOPROL -XL) 25 MG 24 hr tablet Take 1 tablet (25 mg total) by mouth daily. Take with or immediately following a meal. 90 tablet 3   spironolactone  (ALDACTONE ) 25 MG tablet Take 1 tablet (25 mg total) by mouth daily. 30 tablet 6   torsemide  (DEMADEX ) 20 MG tablet Take 1 tablet (20 mg total) by mouth daily. 30 tablet 6   No current facility-administered medications for this encounter.    No Known Allergies    Social History   Socioeconomic History   Marital status: Single    Spouse name: Not on file   Number of children: Not on file   Years of education: Not on file   Highest education level: Not on file  Occupational History   Not on file  Tobacco Use   Smoking status: Never   Smokeless tobacco: Never  Substance and Sexual Activity   Alcohol use: No   Drug use: No   Sexual activity: Not on file  Other Topics Concern   Not on file  Social History Narrative   Not on  file   Social Drivers of Health   Financial Resource Strain: Not on file  Food Insecurity: No Food Insecurity (07/08/2024)   Hunger Vital Sign    Worried About Running Out of Food in the Last Year: Never true    Ran Out of Food in the Last Year: Never true  Transportation Needs: No Transportation Needs (07/08/2024)   PRAPARE - Administrator, Civil Service (Medical): No    Lack of Transportation (Non-Medical): No  Physical Activity: Not on file  Stress: Not on file  Social Connections: Unknown (04/25/2022)   Received from [facility]   Social Network    Social Network: Not on file  Intimate Partner Violence: Not At Risk (07/08/2024)    Humiliation, Afraid, Rape, and Kick questionnaire    Fear of Current or Ex-Partner: No    Emotionally Abused: No    Physically Abused: No    Sexually Abused: No    No family history on file.   Wt Readings from Last 3 Encounters:  09/09/24 131.6 kg (290 lb 3.2 oz)  08/22/24 131.3 kg (289 lb 7.4 oz)  07/16/24 128.4 kg (283 lb)   BP 110/82   Pulse 87   Wt 131.6 kg (290 lb 3.2 oz)   SpO2 98%   BMI 42.86 kg/m   PHYSICAL EXAM: General:  NAD. No resp difficulty, walked into clinic HEENT: Normal Neck: Supple. No JVD. Thick neck Cor: Regular rate & rhythm. No rubs, gallops or murmurs. Lungs: Clear Abdomen: Soft, obese, nontender, nondistended.  Extremities: No cyanosis, clubbing, rash, edema Neuro: Alert & oriented x 3, moves all 4 extremities w/o difficulty. Affect pleasant.  ASSESSMENT & PLAN:  Chronic Biventricular HFrEF  - New diagnosis in the setting of STEMI. Suspect long standing given elevated PA pressures, cardiomyopathy out of proportion to ischemic disease.  - Echo 07/03/24: EF 20-25%, RV severely reduced - NYHA I-II, volume stable today. - Start losartan  12.5 mg daily (BP too soft for Entresto) - Continue torsemide  20 mg daily. - Continue digoxin  0.125 mg daily. - Continue spiro 25 mg daily. - Continue Toprol  XL 25 mg daily. - Continue Jardiance  10 mg daily. - Repeat echo next visit. If EF remains < 35%, refer to EP for ICD consideration. - does not appear to have significant social support at home and given obesity is not a heart transplant candidate, marginal LVAD candidate given RV failure. - BMET, BNP and dig level today; repeat BMET in 10-14 days  CAD - Recent STEMI to LAD. PCI attempted, however c/b LAD perforation and tamponade.  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 New diagnosis - Regular on exam today. - Continue amio 200 mg daily.  - Continue Eliquis  5 mg bid. No bleeding issues.  Follow up in 2 months with Dr. Zenaida + echo. He was given a note for employer today to RTW.  Harlene HERO [place], FNP 09/09/24

## 2024-09-09 ENCOUNTER — Ambulatory Visit (HOSPITAL_COMMUNITY)
Admission: RE | Admit: 2024-09-09 | Discharge: 2024-09-09 | Disposition: A | Discharge status: Home / Self Care | Source: Ambulatory Visit | Attending: Family Medicine | Admitting: Family Medicine

## 2024-09-09 ENCOUNTER — Encounter (HOSPITAL_COMMUNITY): Payer: Self-pay

## 2024-09-09 ENCOUNTER — Ambulatory Visit (HOSPITAL_COMMUNITY): Payer: Self-pay | Admitting: Family Medicine

## 2024-09-09 ENCOUNTER — Encounter (HOSPITAL_COMMUNITY)
Admission: RE | Admit: 2024-09-09 | Discharge: 2024-09-09 | Disposition: A | Discharge status: Home / Self Care | Source: Ambulatory Visit | Attending: Cardiology | Admitting: Cardiology

## 2024-09-09 VITALS — BP 110/82 | HR 87 | Wt 290.2 lb

## 2024-09-09 DIAGNOSIS — Z7984 Long term (current) use of oral hypoglycemic drugs: Secondary | ICD-10-CM | POA: Diagnosis not present

## 2024-09-09 DIAGNOSIS — I2510 Atherosclerotic heart disease of native coronary artery without angina pectoris: Secondary | ICD-10-CM | POA: Diagnosis not present

## 2024-09-09 DIAGNOSIS — Z9861 Coronary angioplasty status: Secondary | ICD-10-CM

## 2024-09-09 DIAGNOSIS — I5082 Biventricular heart failure: Secondary | ICD-10-CM | POA: Diagnosis not present

## 2024-09-09 DIAGNOSIS — I252 Old myocardial infarction: Secondary | ICD-10-CM | POA: Diagnosis not present

## 2024-09-09 DIAGNOSIS — Z79899 Other long term (current) drug therapy: Secondary | ICD-10-CM | POA: Diagnosis not present

## 2024-09-09 DIAGNOSIS — E669 Obesity, unspecified: Secondary | ICD-10-CM | POA: Diagnosis not present

## 2024-09-09 DIAGNOSIS — Z7901 Long term (current) use of anticoagulants: Secondary | ICD-10-CM | POA: Diagnosis not present

## 2024-09-09 DIAGNOSIS — I2102 ST elevation (STEMI) myocardial infarction involving left anterior descending coronary artery: Secondary | ICD-10-CM

## 2024-09-09 DIAGNOSIS — I480 Paroxysmal atrial fibrillation: Secondary | ICD-10-CM | POA: Insufficient documentation

## 2024-09-09 DIAGNOSIS — Z48812 Encounter for surgical aftercare following surgery on the circulatory system: Secondary | ICD-10-CM | POA: Diagnosis not present

## 2024-09-09 DIAGNOSIS — I428 Other cardiomyopathies: Secondary | ICD-10-CM | POA: Diagnosis not present

## 2024-09-09 DIAGNOSIS — Z7982 Long term (current) use of aspirin: Secondary | ICD-10-CM | POA: Insufficient documentation

## 2024-09-09 DIAGNOSIS — I3139 Other pericardial effusion (noninflammatory): Secondary | ICD-10-CM

## 2024-09-09 DIAGNOSIS — Z8572 Personal history of non-Hodgkin lymphomas: Secondary | ICD-10-CM | POA: Diagnosis not present

## 2024-09-09 DIAGNOSIS — I5022 Chronic systolic (congestive) heart failure: Secondary | ICD-10-CM | POA: Diagnosis present

## 2024-09-09 LAB — BASIC METABOLIC PANEL WITH GFR
Anion gap: 10 (ref 5–15)
BUN: 14 mg/dL (ref 6–20)
CO2: 26 mmol/L (ref 22–32)
Calcium: 9 mg/dL (ref 8.9–10.3)
Chloride: 99 mmol/L (ref 98–111)
Creatinine, Ser: 1.34 mg/dL — ABNORMAL HIGH (ref 0.61–1.24)
GFR, Estimated: >60 mL/min (ref >60)
Glucose, Bld: 183 mg/dL — ABNORMAL HIGH (ref 70–99)
Potassium: 4.2 mmol/L (ref 3.5–5.1)
Sodium: 135 mmol/L (ref 135–145)

## 2024-09-09 LAB — DIGOXIN LEVEL: Digoxin Level: 0.7 ng/mL — ABNORMAL LOW (ref 0.8–2.0)

## 2024-09-09 LAB — BRAIN NATRIURETIC PEPTIDE: B Natriuretic Peptide: 209.2 pg/mL — ABNORMAL HIGH (ref 0.0–100.0)

## 2024-09-09 MED ORDER — LOSARTAN POTASSIUM 25 MG PO TABS
12.5000 mg | ORAL_TABLET | Freq: Every day | ORAL | 3 refills | Status: DC
Start: 2024-09-09 — End: 2024-10-03

## 2024-09-09 NOTE — Progress Notes (Signed)
 H&V Care Navigation CSW Progress Note  Clinical Social Worker met with pt to check in.  Being cleared to go back to work so hopeful for financial concerns to be eliminated.  Will go to [place] tomorrow and ask about resources through them- given list of community resources as well.  No further needs at this time   SDOH Screenings   Food Insecurity: No Food Insecurity (07/08/2024)  Housing: Low Risk  (07/08/2024)  Transportation Needs: No Transportation Needs (07/08/2024)  Utilities: Not At Risk (07/08/2024)  Depression (PHQ2-9): Medium Risk (08/22/2024)  Social Connections: Unknown (04/25/2022)   Received from [facility]  Tobacco Use: Low Risk  (09/09/2024)   Andrew HILARIO Leech, LCSW Clinical Social Worker [facility] Desk#: [phone] Cell#: [phone]

## 2024-09-09 NOTE — Patient Instructions (Addendum)
 Good to see you today!  START losartan  12.5 mg (1/2 tablet) daily  Labs done today, your results will be available in MyChart, we will contact you for abnormal readings.  Repeat lab work 10-14 days  Your physician recommends that you schedule a follow-up appointment as scheduled  If you have any questions or concerns before your next appointment please send us  a message through [place] or call our office at [phone].    TO LEAVE A MESSAGE FOR THE NURSE SELECT OPTION 2, PLEASE LEAVE A MESSAGE INCLUDING: YOUR NAME DATE OF BIRTH CALL BACK NUMBER REASON FOR CALL**this is important as we prioritize the call backs  YOU WILL RECEIVE A CALL BACK THE SAME DAY AS LONG AS YOU CALL BEFORE 4:00 PM At the [facility], you and your health needs are our priority. As part of our continuing mission to provide you with exceptional heart care, we have created designated Provider Care Teams. These Care Teams include your primary Cardiologist (physician) and Advanced Practice Providers (APPs- Physician Assistants and Nurse Practitioners) who all work together to provide you with the care you need, when you need it.   You may see any of the following providers on your designated Care Team at your next follow up: Dr Toribio Fuel Dr Ezra Shuck Dr. Ria Commander Dr. Morene Brownie Amy Lenetta, NP Caffie Shed, [place] [facility] [place], [place] Beckey Coe, NP [place] Lee, NP Ellouise Class, NP Tinnie Redman, PharmD Jaun Bash, PharmD   Please be sure to bring in all your medications bottles to every appointment.    Thank you for choosing [facility]

## 2024-09-11 ENCOUNTER — Encounter (HOSPITAL_COMMUNITY)
Admission: RE | Admit: 2024-09-11 | Discharge: 2024-09-11 | Disposition: A | Discharge status: Home / Self Care | Source: Ambulatory Visit | Attending: Cardiology | Admitting: Cardiology

## 2024-09-11 DIAGNOSIS — Z5189 Encounter for other specified aftercare: Secondary | ICD-10-CM | POA: Diagnosis not present

## 2024-09-11 DIAGNOSIS — I2102 ST elevation (STEMI) myocardial infarction involving left anterior descending coronary artery: Secondary | ICD-10-CM

## 2024-09-11 DIAGNOSIS — I252 Old myocardial infarction: Secondary | ICD-10-CM | POA: Diagnosis present

## 2024-09-11 DIAGNOSIS — Z9861 Coronary angioplasty status: Secondary | ICD-10-CM

## 2024-09-11 DIAGNOSIS — I5022 Chronic systolic (congestive) heart failure: Secondary | ICD-10-CM | POA: Insufficient documentation

## 2024-09-12 ENCOUNTER — Telehealth (HOSPITAL_COMMUNITY): Payer: Self-pay

## 2024-09-12 NOTE — Telephone Encounter (Signed)
 Requested paper work faxed to [employer]. My chart message sent to patient to inform him paper work faxed

## 2024-09-16 ENCOUNTER — Encounter (HOSPITAL_COMMUNITY)
Admission: RE | Admit: 2024-09-16 | Discharge: 2024-09-16 | Disposition: A | Discharge status: Home / Self Care | Source: Ambulatory Visit | Attending: Cardiology | Admitting: Cardiology

## 2024-09-16 DIAGNOSIS — I252 Old myocardial infarction: Secondary | ICD-10-CM | POA: Diagnosis not present

## 2024-09-16 DIAGNOSIS — I2102 ST elevation (STEMI) myocardial infarction involving left anterior descending coronary artery: Secondary | ICD-10-CM

## 2024-09-16 DIAGNOSIS — Z9861 Coronary angioplasty status: Secondary | ICD-10-CM

## 2024-09-17 NOTE — Progress Notes (Signed)
 Cardiac Individual Treatment Plan  Patient Details  Name: Andrew Brock MRN: [id number] Date of Birth: 10-Feb-1961 Referring Provider:   Flowsheet Row CARDIAC REHAB PHASE II ORIENTATION from 08/22/2024 in [facility]  Referring Provider Morene Brownie, MD    Initial Encounter Date:  Flowsheet Row CARDIAC REHAB PHASE II ORIENTATION from 08/22/2024 in [facility]  Date 08/22/24    Visit Diagnosis: 06/28/24 ST elevation myocardial infarction involving left anterior descending (LAD) coronary artery (HCC)  06/28/24 S/P PTCA (percutaneous transluminal coronary angioplasty)  Patient's Home Medications on Admission:  Current Outpatient Medications:    albuterol (VENTOLIN HFA) 108 (90 Base) MCG/ACT inhaler, Inhale 1 puff into the lungs every 4 (four) hours as needed for shortness of breath or wheezing., Disp: , Rfl:    amiodarone  (PACERONE ) 200 MG tablet, Take 200 mg twice daily for 14 days (until 8/11), than taken 200 mg once daily, Disp: 42 tablet, Rfl: 6   apixaban  (ELIQUIS ) 5 MG TABS tablet, Take 1 tablet (5 mg total) by mouth 2 (two) times daily., Disp: 60 tablet, Rfl: 6   aspirin  81 MG tablet, Take 1 tablet (81 mg total) by mouth daily., Disp: 30 tablet, Rfl: 0   atorvastatin  (LIPITOR ) 80 MG tablet, Take 80 mg by mouth daily., Disp: , Rfl:    colchicine  0.6 MG tablet, Take 1 tablet (0.6 mg total) by mouth daily., Disp: 30 tablet, Rfl: 3   digoxin  (LANOXIN ) 0.125 MG tablet, Take 1 tablet (0.125 mg total) by mouth daily., Disp: 30 tablet, Rfl: 6   empagliflozin  (JARDIANCE ) 10 MG TABS tablet, Take 1 tablet (10 mg total) by mouth daily., Disp: 30 tablet, Rfl: 6   insulin  glargine (LANTUS ) 100 UNIT/ML injection, Inject 34 Units into the skin daily., Disp: , Rfl:    losartan  (COZAAR ) 25 MG tablet, Take 0.5 tablets (12.5 mg total) by mouth daily., Disp: 90 tablet, Rfl: 3    metFORMIN (GLUCOPHAGE) 1000 MG tablet, Take 1,000 mg by mouth 2 (two) times daily with a meal., Disp: , Rfl:    metoprolol  succinate (TOPROL -XL) 25 MG 24 hr tablet, Take 1 tablet (25 mg total) by mouth daily. Take with or immediately following a meal., Disp: 90 tablet, Rfl: 3   spironolactone  (ALDACTONE ) 25 MG tablet, Take 1 tablet (25 mg total) by mouth daily., Disp: 30 tablet, Rfl: 6   torsemide  (DEMADEX ) 20 MG tablet, Take 1 tablet (20 mg total) by mouth daily., Disp: 30 tablet, Rfl: 6  Past Medical History: Past Medical History:  Diagnosis Date   Cancer (HCC)    hodgkin lymphoma 2005, treated in [place]   Diabetes mellitus (HCC)    Lateral meniscus tear    right knee   Plantar fasciitis     Tobacco Use: Social History   Tobacco Use  Smoking Status Never  Smokeless Tobacco Never    Labs: Review Flowsheet  More data exists      Latest Ref Rng & Units 07/04/2024 07/05/2024 07/06/2024 07/07/2024 07/08/2024  Labs for ITP Cardiac and Pulmonary Rehab  O2 Saturation % 62.9  56.2  59.8  61.2  55.7     Capillary Blood Glucose: Lab Results  Component Value Date   GLUCAP 181 (H) 08/28/2024   GLUCAP 175 (H) 08/28/2024   GLUCAP 149 (H) 08/26/2024   GLUCAP 171 (H) 08/26/2024   GLUCAP 139 (H) 07/08/2024     Exercise Target Goals: Exercise Program  Goal: Individual exercise prescription set using results from initial 6 min walk test and THRR while considering  patient's activity barriers and safety.   Exercise Prescription Goal: Initial exercise prescription builds to 30-45 minutes a day of aerobic activity, 2-3 days per week.  Home exercise guidelines will be given to patient during program as part of exercise prescription that the participant will acknowledge.  Activity Barriers & Risk Stratification:  Activity Barriers & Cardiac Risk Stratification - 08/22/24 0840       Activity Barriers & Cardiac Risk Stratification   Activity Barriers Joint Problems;Deconditioning;Muscular  Weakness;Balance Concerns;Decreased Ventricular Function    Cardiac Risk Stratification High          6 Minute Walk:  6 Minute Walk     Row Name 08/22/24 0939         6 Minute Walk   Phase Initial     Distance 1253 feet     Walk Time 6 minutes     # of Rest Breaks 0     MPH 2.4     METS 2.43     RPE 6     Perceived Dyspnea  0     VO2 Peak 8.5     Symptoms Yes (comment)     Comments 5/10 chronic bilateral knee pain     Resting HR 93 bpm     Resting BP 112/60     Resting Oxygen Saturation  98 %     Exercise Oxygen Saturation  during 6 min walk 97 %     Max Ex. HR 107 bpm     Max Ex. BP 118/70     2 Minute Post BP 102/70        Oxygen Initial Assessment:   Oxygen Re-Evaluation:   Oxygen Discharge (Final Oxygen Re-Evaluation):   Initial Exercise Prescription:  Initial Exercise Prescription - 08/22/24 0800       Date of Initial Exercise RX and Referring Provider   Date 08/22/24    Referring Provider Morene Brownie, MD    Expected Discharge Date 10/21/24      NuStep   Level 1    SPM 75    Minutes 15    METs 2.4      Arm Ergometer   Level 1    Watts 25    RPM 60    Minutes 15    METs 2.4      Prescription Details   Frequency (times per week) 2    Duration Progress to 30 minutes of continuous aerobic without signs/symptoms of physical distress      Intensity   THRR 40-80% of Max Heartrate 64-128    Ratings of Perceived Exertion 11-13    Perceived Dyspnea 0-4      Progression   Progression Continue progressive overload as per policy without signs/symptoms or physical distress.      Resistance Training   Training Prescription Yes    Weight 4lbs    Reps 10-15          Perform Capillary Blood Glucose checks as needed.  Exercise Prescription Changes:   Exercise Prescription Changes     Row Name 08/26/24 1200 09/04/24 1652 09/11/24 1030         Response to Exercise   Blood Pressure (Admit) 114/64 106/70 112/72     Blood Pressure  (Exercise) 108/78 130/70 114/78     Blood Pressure (Exit) 110/60 98/66 96/62      Heart Rate (Admit) 90 bpm 91 bpm 83  bpm     Heart Rate (Exercise) 113 bpm 103 bpm 107 bpm     Heart Rate (Exit) 99 bpm 87 bpm 84 bpm     Rating of Perceived Exertion (Exercise) 7 11 7      Symptoms None None None     Comments Pt's first day in the CRP2 program Reviewed home exercise Rx Reviewed METs     Duration Continue with 30 min of aerobic exercise without signs/symptoms of physical distress. Continue with 30 min of aerobic exercise without signs/symptoms of physical distress. Continue with 30 min of aerobic exercise without signs/symptoms of physical distress.     Intensity THRR unchanged THRR unchanged THRR unchanged       Progression   Progression Continue to progress workloads to maintain intensity without signs/symptoms of physical distress. Continue to progress workloads to maintain intensity without signs/symptoms of physical distress. Continue to progress workloads to maintain intensity without signs/symptoms of physical distress.     Average METs 1.7 2.1 1.9  METs from [facility] missing data       Resistance Training   Training Prescription Yes No No     Weight 4lbs No wts on Wednesdays --     Reps 10-15 -- --     Time 5 Minutes -- --       Interval Training   Interval Training No No No       NuStep   Level 2 3 4      SPM 72 83 --     Minutes 15 15 15      METs 1.8 2.1 --       Arm Ergometer   Level 2 3 3      Watts 16 25 --     RPM 45 55 --     Minutes 15 15 15      METs 1.6 2 1.9       Home Exercise Plan   Plans to continue exercise at -- Home (comment) Home (comment)     Frequency -- Add 2 additional days to program exercise sessions. Add 2 additional days to program exercise sessions.     Initial Home Exercises Provided -- 09/04/24 09/04/24        Exercise Comments:   Exercise Comments     Row Name 08/26/24 1226 09/04/24 1222 09/11/24 0800       Exercise Comments Pt's first day  in the CRP2 program. Pt exericse without complaints and increse workloads on both modalities today. Reviewed home exercise Rx with patient today. Pt voices he has been swimming in the pool and would like to continue as long as the pool is available,  or walk. Pt verbalized understanding of the home exercise Rx and was provided a copy. Reviewed METs with patient. Pt is making progress. Peak METs have gone from 1.8 to 2.4.        Exercise Goals and Review:   Exercise Goals     Row Name 08/22/24 0751             Exercise Goals   Increase Physical Activity Yes       Intervention Provide advice, education, support and counseling about physical activity/exercise needs.;Develop an individualized exercise prescription for aerobic and resistive training based on initial evaluation findings, risk stratification, comorbidities and participant's personal goals.       Expected Outcomes Short Term: Attend rehab on a regular basis to increase amount of physical activity.;Long Term: Add in home exercise to make exercise part of routine and  to increase amount of physical activity.;Long Term: Exercising regularly at least 3-5 days a week.       Increase Strength and Stamina Yes       Intervention Provide advice, education, support and counseling about physical activity/exercise needs.;Develop an individualized exercise prescription for aerobic and resistive training based on initial evaluation findings, risk stratification, comorbidities and participant's personal goals.       Expected Outcomes Short Term: Increase workloads from initial exercise prescription for resistance, speed, and METs.;Short Term: Perform resistance training exercises routinely during rehab and add in resistance training at home;Long Term: Improve cardiorespiratory fitness, muscular endurance and strength as measured by increased METs and functional capacity ( )       Able to understand and use rate of perceived exertion (RPE) scale Yes        Intervention Provide education and explanation on how to use RPE scale       Expected Outcomes Short Term: Able to use RPE daily in rehab to express subjective intensity level;Long Term:  Able to use RPE to guide intensity level when exercising independently       Knowledge and understanding of Target Heart Rate Range (THRR) Yes       Intervention Provide education and explanation of THRR including how the numbers were predicted and where they are located for reference       Expected Outcomes Short Term: Able to state/look up THRR;Short Term: Able to use daily as guideline for intensity in rehab;Long Term: Able to use THRR to govern intensity when exercising independently       Understanding of Exercise Prescription Yes       Intervention Provide education, explanation, and written materials on patient's individual exercise prescription       Expected Outcomes Short Term: Able to explain program exercise prescription;Long Term: Able to explain home exercise prescription to exercise independently          Exercise Goals Re-Evaluation :  Exercise Goals Re-Evaluation     Row Name 08/26/24 1224             Exercise Goal Re-Evaluation   Exercise Goals Review Increase Physical Activity;Increase Strength and Stamina;Able to understand and use rate of perceived exertion (RPE) scale;Knowledge and understanding of Target Heart Rate Range (THRR);Understanding of Exercise Prescription       Comments Pt's first day in the CRP2 program. Pt understnads the exercise Rx, RPE scale and THRR.       Expected Outcomes Will continue to montior patient and progress exericse workloads as tolerated.          Discharge Exercise Prescription (Final Exercise Prescription Changes):  Exercise Prescription Changes - 09/11/24 1030       Response to Exercise   Blood Pressure (Admit) 112/72    Blood Pressure (Exercise) 114/78    Blood Pressure (Exit) 96/62    Heart Rate (Admit) 83 bpm    Heart Rate  (Exercise) 107 bpm    Heart Rate (Exit) 84 bpm    Rating of Perceived Exertion (Exercise) 7    Symptoms None    Comments Reviewed METs    Duration Continue with 30 min of aerobic exercise without signs/symptoms of physical distress.    Intensity THRR unchanged      Progression   Progression Continue to progress workloads to maintain intensity without signs/symptoms of physical distress.    Average METs 1.9   METs from [facility] missing Hotel manager Prescription No  Interval Training   Interval Training No      NuStep   Level 4    Minutes 15      Arm Ergometer   Level 3    Minutes 15    METs 1.9      Home Exercise Plan   Plans to continue exercise at Home (comment)    Frequency Add 2 additional days to program exercise sessions.    Initial Home Exercises Provided 09/04/24          Nutrition:  Target Goals: Understanding of nutrition guidelines, daily intake of sodium 1500mg , cholesterol 200mg , calories 30% from fat and 7% or less from saturated fats, daily to have 5 or more servings of fruits and vegetables.  Biometrics:  Pre Biometrics - 08/22/24 0800       Pre Biometrics   Waist Circumference 55 inches    Hip Circumference 51 inches    Waist to Hip Ratio 1.08 %    Triceps Skinfold 11 mm    % Body Fat 38.7 %    Grip Strength 40 kg    Flexibility 9 in    Single Leg Stand 8.68 seconds           Nutrition Therapy Plan and Nutrition Goals:   Nutrition Assessments:  MEDIFICTS Score Key: >=70 Need to make dietary changes  40-70 Heart Healthy Diet <= 40 Therapeutic Level Cholesterol Diet   Flowsheet Row CARDIAC REHAB PHASE II EXERCISE from 08/26/2024 in [facility]  Picture Your Plate Total Score on Admission 58   Picture Your Plate Scores: <59 Unhealthy dietary pattern with much room for improvement. 41-50 Dietary pattern unlikely to meet recommendations for good  health and room for improvement. 51-60 More healthful dietary pattern, with some room for improvement.  >60 Healthy dietary pattern, although there may be some specific behaviors that could be improved.    Nutrition Goals Re-Evaluation:   Nutrition Goals Re-Evaluation:   Nutrition Goals Discharge (Final Nutrition Goals Re-Evaluation):   Psychosocial: Target Goals: Acknowledge presence or absence of significant depression and/or stress, maximize coping skills, provide positive support system. Participant is able to verbalize types and ability to use techniques and skills needed for reducing stress and depression.  Initial Review & Psychosocial Screening:  Initial Psych Review & Screening - 08/22/24 1142       Initial Review   Current issues with Current Depression;Current Sleep Concerns;Current Anxiety/Panic;Current Stress Concerns    Source of Stress Concerns Chronic Illness      Family Dynamics   Good Support System? Yes   has brother for support     Barriers   Psychosocial barriers to participate in program The patient should benefit from training in stress management and relaxation.      Screening Interventions   Interventions Encouraged to exercise;Other (comment)    [facility] use of anitdepressant meds. Used in past and voices did not change the way he felt.    Expected Outcomes Short Term goal: Utilizing psychosocial counselor, staff and physician to assist with identification of specific Stressors or current issues interfering with healing process. Setting desired goal for each stressor or current issue identified.;Long Term Goal: Stressors or current issues are controlled or eliminated.;Short Term goal: Identification and review with participant of any Quality of Life or Depression concerns found by scoring the questionnaire.;Long Term goal: The participant improves quality of Life and PHQ9 Scores as seen by post  scores and/or  verbalization of changes          Quality of Life Scores:  Quality of Life - 08/22/24 1146       Quality of Life   Select Quality of Life      Quality of Life Scores   Health/Function Pre 24.6 %    Socioeconomic Pre 27.75 %    Psych/Spiritual Pre 25.57 %    Family Pre 30 %    GLOBAL Pre 26.08 %         Scores of 19 and below usually indicate a poorer quality of life in these areas.  A difference of  2-3 points is a clinically meaningful difference.  A difference of 2-3 points in the total score of the Quality of Life Index has been associated with significant improvement in overall quality of life, self-image, physical symptoms, and general health in studies assessing change in quality of life.  PHQ-9: Review Flowsheet       08/22/2024  Depression screen PHQ 2/9  Decreased Interest 0  Down, Depressed, Hopeless 2  PHQ - 2 Score 2  Altered sleeping 1  Tired, decreased energy 0  Change in appetite 2  Feeling bad or failure about yourself  0  Trouble concentrating 0  Moving slowly or fidgety/restless 0  Suicidal thoughts 0  PHQ-9 Score 5  Difficult doing work/chores Not difficult at all   Interpretation of Total Score  Total Score Depression Severity:  1-4 = Minimal depression, 5-9 = Mild depression, 10-14 = Moderate depression, 15-19 = Moderately severe depression, 20-27 = Severe depression   Psychosocial Evaluation and Intervention:   Psychosocial Re-Evaluation:  Psychosocial Re-Evaluation     Row Name 08/26/24 1632 09/17/24 1707           Psychosocial Re-Evaluation   Current issues with Current Anxiety/Panic;Current Stress Concerns;Current Sleep Concerns;Current Depression Current Anxiety/Panic;Current Stress Concerns;Current Sleep Concerns;Current Depression      Comments Andrew Brock started cardiac rehab, Andrew Brock did reports stress concerns regarding transportation resources provided Andrew Brock is returning to work on 09/16/24. Andrew Brock is hopeful that his short term  disability will be reinstated so that he can receive back pay. Andrew Brock also hopes that his car will be repaired. Andrew Brock says he is ina better mental state.      Expected Outcomes Andrew Brock will have controlled or decreased depression/ stressors upon completion of cardiac rehab. Andrew Brock will have controlled or decreased depression/ stressors upon completion of cardiac rehab.      Interventions Stress management education;Relaxation education;Encouraged to attend Cardiac Rehabilitation for the exercise Stress management education;Relaxation education;Encouraged to attend Cardiac Rehabilitation for the exercise      Continue Psychosocial Services  Follow up required by staff Follow up required by staff        Initial Review   Source of Stress Concerns Chronic Illness;Transportation Chronic Illness;Transportation      Comments Will continue to monitor and offer support as needed. Will continue to monitor and offer support as needed.         Psychosocial Discharge (Final Psychosocial Re-Evaluation):  Psychosocial Re-Evaluation - 09/17/24 1707       Psychosocial Re-Evaluation   Current issues with Current Anxiety/Panic;Current Stress Concerns;Current Sleep Concerns;Current Depression    Comments Andrew Brock is returning to work on 09/16/24. Andrew Brock is hopeful that his short term disability will be reinstated so that he can receive back pay. Andrew Brock also hopes that his car will be repaired. Andrew Brock says he is ina better mental state.  Expected Outcomes Andrew Brock will have controlled or decreased depression/ stressors upon completion of cardiac rehab.    Interventions Stress management education;Relaxation education;Encouraged to attend Cardiac Rehabilitation for the exercise    Continue Psychosocial Services  Follow up required by staff      Initial Review   Source of Stress Concerns Chronic Illness;Transportation    Comments Will continue to monitor and offer support as needed.          Vocational  Rehabilitation: Provide vocational rehab assistance to qualifying candidates.   Vocational Rehab Evaluation & Intervention:  Vocational Rehab - 08/22/24 1145       Initial Vocational Rehab Evaluation & Intervention   Assessment shows need for Vocational Rehabilitation No   plans to return to his job when [employer] cleared         Education: Education Goals: Education classes will be provided on a weekly basis, covering required topics. Participant will state understanding/return demonstration of topics presented.    Education     Row Name 08/26/24 0800     Education   Cardiac Education Topics Andrew Brock   Select Core Videos     Core Videos   Educator Exercise Physiologist   Select Exercise Education   Exercise Education Biomechanial Limitations   Instruction Review Code 1- Verbalizes Understanding   Class Start Time [phone]   Class Stop Time 0846   Class Time Calculation (min) 35 min    Row Name 08/28/24 0800     Education   Cardiac Education Topics Andrew Brock   Secondary school teacher School   Educator Nurse   Weekly Topic Fast Evening Meals   Instruction Review Code 1- Verbalizes Understanding   Class Start Time 0815   Class Stop Time 0847   Class Time Calculation (min) 32 min    Row Name 09/02/24 0800     Education   Cardiac Education Topics Andrew Brock   Select Core Videos     Core Videos   Educator Exercise Physiologist   Select Exercise Education   Exercise Education Improving Performance   Instruction Review Code 1- Verbalizes Understanding   Class Start Time [phone]   Class Stop Time 0849   Class Time Calculation (min) 37 min    Row Name 09/09/24 0800     Education   Cardiac Education Topics Andrew Brock   Select Workshops     Workshops   Educator Exercise Physiologist   Select Psychosocial   Psychosocial Workshop Healthy Sleep for a Healthy Heart   Instruction Review Code 1- Verbalizes Understanding   Class Start Time 0815   Class Stop Time  0857   Class Time Calculation (min) 42 min    Row Name 09/11/24 0800     Education   Cardiac Education Topics Andrew Brock   Multimedia programmer School     Cooking School   Educator Nurse;Respiratory Therapist   Weekly Topic Simple Sides and Sauces   Instruction Review Code 1- Verbalizes Understanding   Class Start Time [phone]   Class Stop Time 0854   Class Time Calculation (min) 43 min    Row Name 09/16/24 0800     Education   Cardiac Education Topics Andrew Brock   Psychologist, forensic General Education   General Education Heart Disease Risk Reduction   Instruction Review Code 1- Verbalizes Understanding   Class Start Time 0818   Class Stop Time 0853   Class Time  Calculation (min) 35 min      Core Videos: Exercise    Move It!  Clinical staff conducted group or individual video education with verbal and written material and guidebook.  Patient learns the recommended Andrew Brock exercise program. Exercise with the goal of living a long, healthy life. Some of the health benefits of exercise include controlled diabetes, healthier blood pressure levels, improved cholesterol levels, improved heart and lung capacity, improved sleep, and better body composition. Everyone should speak with their doctor before starting or changing an exercise routine.  Biomechanical Limitations Clinical staff conducted group or individual video education with verbal and written material and guidebook.  Patient learns how biomechanical limitations can impact exercise and how we can mitigate and possibly overcome limitations to have an impactful and balanced exercise routine.  Body Composition Clinical staff conducted group or individual video education with verbal and written material and guidebook.  Patient learns that body composition (ratio of muscle mass to fat mass) is a key component to assessing overall fitness, rather than body weight alone. Increased  fat mass, especially visceral belly fat, can put us  at increased risk for metabolic syndrome, type 2 diabetes, heart disease, and even death. It is recommended to combine diet and exercise (cardiovascular and resistance training) to improve your body composition. Seek guidance from your physician and exercise physiologist before implementing an exercise routine.  Exercise Action Plan Clinical staff conducted group or individual video education with verbal and written material and guidebook.  Patient learns the recommended strategies to achieve and enjoy long-term exercise adherence, including variety, self-motivation, self-efficacy, and positive decision making. Benefits of exercise include fitness, good health, weight management, more energy, better sleep, less stress, and overall well-being.  Medical   Heart Disease Risk Reduction Clinical staff conducted group or individual video education with verbal and written material and guidebook.  Patient learns our heart is our most vital organ as it circulates oxygen, nutrients, white blood cells, and hormones throughout the entire body, and carries waste away. Data supports a plant-based eating plan like the Andrew Brock Program for its effectiveness in slowing progression of and reversing heart disease. The video provides a number of recommendations to address heart disease.   Metabolic Syndrome and Belly Fat  Clinical staff conducted group or individual video education with verbal and written material and guidebook.  Patient learns what metabolic syndrome is, how it leads to heart disease, and how one can reverse it and keep it from coming back. You have metabolic syndrome if you have 3 of the following 5 criteria: abdominal obesity, high blood pressure, high triglycerides, low HDL cholesterol, and high blood sugar.  Hypertension and Heart Disease Clinical staff conducted group or individual video education with verbal and written material and guidebook.   Patient learns that high blood pressure, or hypertension, is very common in the [place] . Hypertension is largely due to excessive salt intake, but other important risk factors include being overweight, physical inactivity, drinking too much alcohol, smoking, and not eating enough potassium from fruits and vegetables. High blood pressure is a leading risk factor for heart attack, stroke, congestive heart failure, dementia, kidney failure, and premature death. Long-term effects of excessive salt intake include stiffening of the arteries and thickening of heart muscle and organ damage. Recommendations include ways to reduce hypertension and the risk of heart disease.  Diseases of Our Time - Focusing on Diabetes Clinical staff conducted group or individual video education with verbal and written material and guidebook.  Patient learns why  the best way to stop diseases of our time is prevention, through food and other lifestyle changes. Medicine (such as prescription pills and surgeries) is often only a Band-Aid on the problem, not a long-term solution. Most common diseases of our time include obesity, type 2 diabetes, hypertension, heart disease, and cancer. The Andrew Brock Program is recommended and has been proven to help reduce, reverse, and/or prevent the damaging effects of metabolic syndrome.  Nutrition   Overview of the Andrew Brock Eating Plan  Clinical staff conducted group or individual video education with verbal and written material and guidebook.  Patient learns about the Andrew Brock Eating Plan for disease risk reduction. The Andrew Brock Eating Plan emphasizes a wide variety of unrefined, minimally-processed carbohydrates, like fruits, vegetables, whole grains, and legumes. Go, Caution, and Stop food choices are explained. Plant-based and lean animal proteins are emphasized. Rationale provided for low sodium intake for blood pressure control, low added sugars for blood sugar stabilization, and low  added fats and oils for coronary artery disease risk reduction and weight management.  Calorie Density  Clinical staff conducted group or individual video education with verbal and written material and guidebook.  Patient learns about calorie density and how it impacts the Andrew Brock Eating Plan. Knowing the characteristics of the food you choose will help you decide whether those foods will lead to weight gain or weight loss, and whether you want to consume more or less of them. Weight loss is usually a side effect of the Andrew Brock Eating Plan because of its focus on low calorie-dense foods.  Label Reading  Clinical staff conducted group or individual video education with verbal and written material and guidebook.  Patient learns about the Andrew Brock recommended label reading guidelines and corresponding recommendations regarding calorie density, added sugars, sodium content, and whole grains.  Dining Out - Part 1  Clinical staff conducted group or individual video education with verbal and written material and guidebook.  Patient learns that restaurant meals can be sabotaging because they can be so high in calories, fat, sodium, and/or sugar. Patient learns recommended strategies on how to positively address this and avoid unhealthy pitfalls.  Facts on Fats  Clinical staff conducted group or individual video education with verbal and written material and guidebook.  Patient learns that lifestyle modifications can be just as effective, if not more so, as many medications for lowering your risk of heart disease. A Andrew Brock lifestyle can help to reduce your risk of inflammation and atherosclerosis (cholesterol build-up, or plaque, in the artery walls). Lifestyle interventions such as dietary choices and physical activity address the cause of atherosclerosis. A review of the types of fats and their impact on blood cholesterol levels, along with dietary recommendations to reduce fat intake is also  included.  Nutrition Action Plan  Clinical staff conducted group or individual video education with verbal and written material and guidebook.  Patient learns how to incorporate Andrew Brock recommendations into their lifestyle. Recommendations include planning and keeping personal health goals in mind as an important part of their success.  Healthy Mind-Set    Healthy Minds, Bodies, Hearts  Clinical staff conducted group or individual video education with verbal and written material and guidebook.  Patient learns how to identify when they are stressed. Video will discuss the impact of that stress, as well as the many benefits of stress management. Patient will also be introduced to stress management techniques. The way we think, act, and feel has an impact on our hearts.  How Our Thoughts Can Heal Our  Hearts  Clinical staff conducted group or individual video education with verbal and written material and guidebook.  Patient learns that negative thoughts can cause depression and anxiety. This can result in negative lifestyle behavior and serious health problems. Cognitive behavioral therapy is an effective method to help control our thoughts in order to change and improve our emotional outlook.  Additional Videos:  Exercise    Improving Performance  Clinical staff conducted group or individual video education with verbal and written material and guidebook.  Patient learns to use a non-linear approach by alternating intensity levels and lengths of time spent exercising to help burn more calories and lose more body fat. Cardiovascular exercise helps improve heart health, metabolism, hormonal balance, blood sugar control, and recovery from fatigue. Resistance training improves strength, endurance, balance, coordination, reaction time, metabolism, and muscle mass. Flexibility exercise improves circulation, posture, and balance. Seek guidance from your physician and exercise physiologist before  implementing an exercise routine and learn your capabilities and proper form for all exercise.  Introduction to Yoga  Clinical staff conducted group or individual video education with verbal and written material and guidebook.  Patient learns about yoga, a discipline of the coming together of mind, breath, and body. The benefits of yoga include improved flexibility, improved range of motion, better posture and core strength, increased lung function, weight loss, and positive self-image. Yoga's heart health benefits include lowered blood pressure, healthier heart rate, decreased cholesterol and triglyceride levels, improved immune function, and reduced stress. Seek guidance from your physician and exercise physiologist before implementing an exercise routine and learn your capabilities and proper form for all exercise.  Medical   Aging: Enhancing Your Quality of Life  Clinical staff conducted group or individual video education with verbal and written material and guidebook.  Patient learns key strategies and recommendations to stay in good physical health and enhance quality of life, such as prevention strategies, having an advocate, securing a Health Care Proxy and Power of Attorney, and keeping a list of medications and system for tracking them. It also discusses how to avoid risk for bone loss.  Biology of Weight Control  Clinical staff conducted group or individual video education with verbal and written material and guidebook.  Patient learns that weight gain occurs because we consume more calories than we burn (eating more, moving less). Even if your body weight is normal, you may have higher ratios of fat compared to muscle mass. Too much body fat puts you at increased risk for cardiovascular disease, heart attack, stroke, type 2 diabetes, and obesity-related cancers. In addition to exercise, following the Andrew Brock Eating Plan can help reduce your risk.  Decoding Lab Results  Clinical staff  conducted group or individual video education with verbal and written material and guidebook.  Patient learns that lab test reflects one measurement whose values change over time and are influenced by many factors, including medication, stress, sleep, exercise, food, hydration, pre-existing medical conditions, and more. It is recommended to use the knowledge from this video to become more involved with your lab results and evaluate your numbers to speak with your doctor.   Diseases of Our Time - Overview  Clinical staff conducted group or individual video education with verbal and written material and guidebook.  Patient learns that according to the CDC, 50% to 70% of chronic diseases (such as obesity, type 2 diabetes, elevated lipids, hypertension, and heart disease) are avoidable through lifestyle improvements including healthier food choices, listening to satiety cues, and increased physical activity.  Sleep Disorders Clinical staff conducted group or individual video education with verbal and written material and guidebook.  Patient learns how good quality and duration of sleep are important to overall health and well-being. Patient also learns about sleep disorders and how they impact health along with recommendations to address them, including discussing with a physician.  Nutrition  Dining Out - Part 2 Clinical staff conducted group or individual video education with verbal and written material and guidebook.  Patient learns how to plan ahead and communicate in order to maximize their dining experience in a healthy and nutritious manner. Included are recommended food choices based on the type of restaurant the patient is visiting.   Fueling a Banker conducted group or individual video education with verbal and written material and guidebook.  There is a strong connection between our food choices and our health. Diseases like obesity and type 2 diabetes are very  prevalent and are in large-part due to lifestyle choices. The Andrew Brock Eating Plan provides plenty of food and hunger-curbing satisfaction. It is easy to follow, affordable, and helps reduce health risks.  Menu Workshop  Clinical staff conducted group or individual video education with verbal and written material and guidebook.  Patient learns that restaurant meals can sabotage health goals because they are often packed with calories, fat, sodium, and sugar. Recommendations include strategies to plan ahead and to communicate with the manager, chef, or server to help order a healthier meal.  Planning Your Eating Strategy  Clinical staff conducted group or individual video education with verbal and written material and guidebook.  Patient learns about the Andrew Brock Eating Plan and its benefit of reducing the risk of disease. The Andrew Brock Eating Plan does not focus on calories. Instead, it emphasizes high-quality, nutrient-rich foods. By knowing the characteristics of the foods, we choose, we can determine their calorie density and make informed decisions.  Targeting Your Nutrition Priorities  Clinical staff conducted group or individual video education with verbal and written material and guidebook.  Patient learns that lifestyle habits have a tremendous impact on disease risk and progression. This video provides eating and physical activity recommendations based on your personal health goals, such as reducing LDL cholesterol, losing weight, preventing or controlling type 2 diabetes, and reducing high blood pressure.  Vitamins and Minerals  Clinical staff conducted group or individual video education with verbal and written material and guidebook.  Patient learns different ways to obtain key vitamins and minerals, including through a recommended healthy diet. It is important to discuss all supplements you take with your doctor.   Healthy Mind-Set    Smoking Cessation  Clinical staff conducted group  or individual video education with verbal and written material and guidebook.  Patient learns that cigarette smoking and tobacco addiction pose a serious health risk which affects millions of people. Stopping smoking will significantly reduce the risk of heart disease, lung disease, and many forms of cancer. Recommended strategies for quitting are covered, including working with your doctor to develop a successful plan.  Culinary   Becoming a Set designer conducted group or individual video education with verbal and written material and guidebook.  Patient learns that cooking at home can be healthy, cost-effective, quick, and puts them in control. Keys to cooking healthy recipes will include looking at your recipe, assessing your equipment needs, planning ahead, making it simple, choosing cost-effective seasonal ingredients, and limiting the use of added fats, salts, and sugars.  Cooking - Breakfast and  Snacks  Clinical staff conducted group or individual video education with verbal and written material and guidebook.  Patient learns how important breakfast is to satiety and nutrition through the entire day. Recommendations include key foods to eat during breakfast to help stabilize blood sugar levels and to prevent overeating at meals later in the day. Planning ahead is also a key component.  Cooking - Educational psychologist conducted group or individual video education with verbal and written material and guidebook.  Patient learns eating strategies to improve overall health, including an approach to cook more at home. Recommendations include thinking of animal protein as a side on your plate rather than center stage and focusing instead on lower calorie dense options like vegetables, fruits, whole grains, and plant-based proteins, such as beans. Making sauces in large quantities to freeze for later and leaving the skin on your vegetables are also recommended to maximize  your experience.  Cooking - Healthy Salads and Dressing Clinical staff conducted group or individual video education with verbal and written material and guidebook.  Patient learns that vegetables, fruits, whole grains, and legumes are the foundations of the Andrew Brock Eating Plan. Recommendations include how to incorporate each of these in flavorful and healthy salads, and how to create homemade salad dressings. Proper handling of ingredients is also covered. Cooking - Soups and [employer] - Soups and Desserts Clinical staff conducted group or individual video education with verbal and written material and guidebook.  Patient learns that Andrew Brock soups and desserts make for easy, nutritious, and delicious snacks and meal components that are low in sodium, fat, sugar, and calorie density, while high in vitamins, minerals, and filling fiber. Recommendations include simple and healthy ideas for soups and desserts.   Overview     The Andrew Brock Solution Program Overview Clinical staff conducted group or individual video education with verbal and written material and guidebook.  Patient learns that the results of the Andrew Brock Program have been documented in more than 100 articles published in peer-reviewed journals, and the benefits include reducing risk factors for (and, in some cases, even reversing) high cholesterol, high blood pressure, type 2 diabetes, obesity, and more! An overview of the three key pillars of the Andrew Brock Program will be covered: eating well, doing regular exercise, and having a healthy mind-set.  WORKSHOPS  Exercise: Exercise Basics: Building Your Action Plan Clinical staff led group instruction and group discussion with PowerPoint presentation and patient guidebook. To enhance the learning environment the use of posters, models and videos may be added. At the conclusion of this workshop, patients will comprehend the difference between physical activity and exercise, as well  as the benefits of incorporating both, into their routine. Patients will understand the FITT (Frequency, Intensity, Time, and Type) principle and how to use it to build an exercise action plan. In addition, safety concerns and other considerations for exercise and cardiac rehab will be addressed by the presenter. The purpose of this lesson is to promote a comprehensive and effective weekly exercise routine in order to improve patients' overall level of fitness.   Managing Heart Disease: Your Path to a Healthier Heart Clinical staff led group instruction and group discussion with PowerPoint presentation and patient guidebook. To enhance the learning environment the use of posters, models and videos may be added.At the conclusion of this workshop, patients will understand the anatomy and physiology of the heart. Additionally, they will understand how Andrew Brock's three pillars impact the risk factors, the progression, and the  management of heart disease.  The purpose of this lesson is to provide a high-level overview of the heart, heart disease, and how the Andrew Brock lifestyle positively impacts risk factors.  Exercise Biomechanics Clinical staff led group instruction and group discussion with PowerPoint presentation and patient guidebook. To enhance the learning environment the use of posters, models and videos may be added. Patients will learn how the structural parts of their bodies function and how these functions impact their daily activities, movement, and exercise. Patients will learn how to promote a neutral spine, learn how to manage pain, and identify ways to improve their physical movement in order to promote healthy living. The purpose of this lesson is to expose patients to common physical limitations that impact physical activity. Participants will learn practical ways to adapt and manage aches and pains, and to minimize their effect on regular exercise. Patients will learn how to  maintain good posture while sitting, walking, and lifting.  Balance Training and Fall Prevention  Clinical staff led group instruction and group discussion with PowerPoint presentation and patient guidebook. To enhance the learning environment the use of posters, models and videos may be added. At the conclusion of this workshop, patients will understand the importance of their sensorimotor skills (vision, proprioception, and the vestibular system) in maintaining their ability to balance as they age. Patients will apply a variety of balancing exercises that are appropriate for their current level of function. Patients will understand the common causes for poor balance, possible solutions to these problems, and ways to modify their physical environment in order to minimize their fall risk. The purpose of this lesson is to teach patients about the importance of maintaining balance as they age and ways to minimize their risk of falling.  WORKSHOPS   Nutrition:  Fueling a Ship broker led group instruction and group discussion with PowerPoint presentation and patient guidebook. To enhance the learning environment the use of posters, models and videos may be added. Patients will review the foundational principles of the Andrew Brock Eating Plan and understand what constitutes a serving size in each of the food groups. Patients will also learn Andrew Brock-friendly foods that are better choices when away from home and review make-ahead meal and snack options. Calorie density will be reviewed and applied to three nutrition priorities: weight maintenance, weight loss, and weight gain. The purpose of this lesson is to reinforce (in a group setting) the key concepts around what patients are recommended to eat and how to apply these guidelines when away from home by planning and selecting Andrew Brock-friendly options. Patients will understand how calorie density may be adjusted for different weight management  goals.  Mindful Eating  Clinical staff led group instruction and group discussion with PowerPoint presentation and patient guidebook. To enhance the learning environment the use of posters, models and videos may be added. Patients will briefly review the concepts of the Andrew Brock Eating Plan and the importance of low-calorie dense foods. The concept of mindful eating will be introduced as well as the importance of paying attention to internal hunger signals. Triggers for non-hunger eating and techniques for dealing with triggers will be explored. The purpose of this lesson is to provide patients with the opportunity to review the basic principles of the Andrew Brock Eating Plan, discuss the value of eating mindfully and how to measure internal cues of hunger and fullness using the Hunger Scale. Patients will also discuss reasons for non-hunger eating and learn strategies to use for controlling emotional eating.  Targeting  Your Nutrition Priorities Clinical staff led group instruction and group discussion with PowerPoint presentation and patient guidebook. To enhance the learning environment the use of posters, models and videos may be added. Patients will learn how to determine their genetic susceptibility to disease by reviewing their family history. Patients will gain insight into the importance of diet as part of an overall healthy lifestyle in mitigating the impact of genetics and other environmental insults. The purpose of this lesson is to provide patients with the opportunity to assess their personal nutrition priorities by looking at their family history, their own health history and current risk factors. Patients will also be able to discuss ways of prioritizing and modifying the Andrew Brock Eating Plan for their highest risk areas  Menu  Clinical staff led group instruction and group discussion with PowerPoint presentation and patient guidebook. To enhance the learning environment the use of posters,  models and videos may be added. Using menus brought in from [employer], or printed from [employer], patients will apply the Andrew Brock dining out guidelines that were presented in the [employer] video. Patients will also be able to practice these guidelines in a variety of provided scenarios. The purpose of this lesson is to provide patients with the opportunity to practice hands-on learning of the Andrew Brock Dining Out guidelines with actual menus and practice scenarios.  Label Reading Clinical staff led group instruction and group discussion with PowerPoint presentation and patient guidebook. To enhance the learning environment the use of posters, models and videos may be added. Patients will review and discuss the Andrew Brock label reading guidelines presented in Andrew Brock's Label Reading Educational series video. Using fool labels brought in from local grocery stores and markets, patients will apply the label reading guidelines and determine if the packaged food meet the Andrew Brock guidelines. The purpose of this lesson is to provide patients with the opportunity to review, discuss, and practice hands-on learning of the Andrew Brock Label Reading guidelines with actual packaged food labels. Cooking School  Andrew Brock's [employer] are designed to teach patients ways to prepare quick, simple, and affordable recipes at home. The importance of nutrition's role in chronic disease risk reduction is reflected in its emphasis in the overall Andrew Brock program. By learning how to prepare essential core Andrew Brock Eating Plan recipes, patients will increase control over what they eat; be able to customize the flavor of foods without the use of added salt, sugar, or fat; and improve the quality of the food they consume. By learning a set of core recipes which are easily assembled, quickly prepared, and affordable, patients are more likely to prepare more healthy foods at home. These workshops  focus on convenient breakfasts, simple entres, side dishes, and desserts which can be prepared with minimal effort and are consistent with nutrition recommendations for cardiovascular risk reduction. Cooking [employer] are taught by a Armed forces logistics/support/administrative officer (RD) who has been trained by the [employer]. The chef or RD has a clear understanding of the importance of minimizing - if not completely eliminating - added fat, sugar, and sodium in recipes. Throughout the series of Cooking School Workshop sessions, patients will learn about healthy ingredients and efficient methods of cooking to build confidence in their capability to prepare    Cooking School weekly topics:  Adding Flavor- Sodium-Free  Fast and Healthy Breakfasts  Powerhouse Plant-Based Proteins  Satisfying Salads and Dressings  Simple Sides and Sauces  International Cuisine-Spotlight on the [employer]  Savory Soups  Efficiency Cooking - Meals in a Hydrologist and Snacks  Comforting Weekend Breakfasts  One-Pot Wonders   Fast [employer] Your Andrew Brock Plate  WORKSHOPS   Healthy Mindset (Psychosocial):  Focused Goals, Sustainable Changes Clinical staff led group instruction and group discussion with PowerPoint presentation and patient guidebook. To enhance the learning environment the use of posters, models and videos may be added. Patients will be able to apply effective goal setting strategies to establish at least one personal goal, and then take consistent, meaningful action toward that goal. They will learn to identify common barriers to achieving personal goals and develop strategies to overcome them. Patients will also gain an understanding of how our mind-set can impact our ability to achieve goals and the importance of cultivating a positive and growth-oriented mind-set. The purpose of this lesson is to provide patients with a deeper  understanding of how to set and achieve personal goals, as well as the tools and strategies needed to overcome common obstacles which may arise along the way.  From Head to Heart: The Power of a Healthy Outlook  Clinical staff led group instruction and group discussion with PowerPoint presentation and patient guidebook. To enhance the learning environment the use of posters, models and videos may be added. Patients will be able to recognize and describe the impact of emotions and mood on physical health. They will discover the importance of self-care and explore self-care practices which may work for them. Patients will also learn how to utilize the 4 C's to cultivate a healthier outlook and better manage stress and challenges. The purpose of this lesson is to demonstrate to patients how a healthy outlook is an essential part of maintaining good health, especially as they continue their cardiac rehab journey.  Healthy Sleep for a Healthy Heart Clinical staff led group instruction and group discussion with PowerPoint presentation and patient guidebook. To enhance the learning environment the use of posters, models and videos may be added. At the conclusion of this workshop, patients will be able to demonstrate knowledge of the importance of sleep to overall health, well-being, and quality of life. They will understand the symptoms of, and treatments for, common sleep disorders. Patients will also be able to identify daytime and nighttime behaviors which impact sleep, and they will be able to apply these tools to help manage sleep-related challenges. The purpose of this lesson is to provide patients with a general overview of sleep and outline the importance of quality sleep. Patients will learn about a few of the most common sleep disorders. Patients will also be introduced to the concept of "sleep hygiene," and discover ways to self-manage certain sleeping problems through simple daily behavior changes.  Finally, the workshop will motivate patients by clarifying the links between quality sleep and their goals of heart-healthy living.   Recognizing and Reducing Stress Clinical staff led group instruction and group discussion with PowerPoint presentation and patient guidebook. To enhance the learning environment the use of posters, models and videos may be added. At the conclusion of this workshop, patients will be able to understand the types of stress reactions, differentiate between acute and chronic stress, and recognize the impact that chronic stress has on their health. They will also be able to apply different coping mechanisms, such as reframing negative self-talk. Patients will have the opportunity to practice a variety of stress management techniques, such as deep abdominal breathing, progressive muscle relaxation, and/or guided imagery.  The purpose of this lesson is to educate patients on the role of stress in their lives and to provide healthy techniques for coping with it.  Learning Barriers/Preferences:  Learning Barriers/Preferences - 08/22/24 1144       Learning Barriers/Preferences   Learning Barriers Sight    Learning Preferences Audio;Skilled Demonstration;Computer/Internet;Verbal Instruction;Group Instruction;Written Material;Video;Individual Instruction;Pictoral          Education Topics:  Knowledge Questionnaire Score:  Knowledge Questionnaire Score - 08/22/24 1144       Knowledge Questionnaire Score   Pre Score 22/24          Core Components/Risk Factors/Patient Goals at Admission:  Personal Goals and Risk Factors at Admission - 08/22/24 1145       Core Components/Risk Factors/Patient Goals on Admission    Weight Management Yes;Obesity;Weight Loss    Intervention Weight Management: Develop a combined nutrition and exercise program designed to reach desired caloric intake, while maintaining appropriate intake of nutrient and fiber, sodium and fats, and  appropriate energy expenditure required for the weight goal.;Weight Management: Provide education and appropriate resources to help participant work on and attain dietary goals.;Weight Management/Obesity: Establish reasonable short term and long term weight goals.;Obesity: Provide education and appropriate resources to help participant work on and attain dietary goals.    Admit Weight 289 lb 7.4 oz (131.3 kg)    Expected Outcomes Short Term: Continue to assess and modify interventions until short term weight is achieved;Long Term: Adherence to nutrition and physical activity/exercise program aimed toward attainment of established weight goal;Weight Loss: Understanding of general recommendations for a balanced deficit meal plan, which promotes 1-2 lb weight loss per week and includes a negative energy balance of [id number] kcal/d;Understanding recommendations for meals to include 15-35% energy as protein, 25-35% energy from fat, 35-60% energy from carbohydrates, less than 200mg  of dietary cholesterol, 20-35 gm of total fiber daily;Understanding of distribution of calorie intake throughout the day with the consumption of 4-5 meals/snacks    Diabetes Yes    Intervention Provide education about signs/symptoms and action to take for hypo/hyperglycemia.;Provide education about proper nutrition, including hydration, and aerobic/resistive exercise prescription along with prescribed medications to achieve blood glucose in normal ranges: Fasting glucose 65-99 mg/dL    Expected Outcomes Short Term: Participant verbalizes understanding of the signs/symptoms and immediate care of hyper/hypoglycemia, proper foot care and importance of medication, aerobic/resistive exercise and nutrition plan for blood glucose control.;Long Term: Attainment of HbA1C < 7%.    Heart Failure Yes    Intervention Provide a combined exercise and nutrition program that is supplemented with education, support and counseling about heart failure.  Directed toward relieving symptoms such as shortness of breath, decreased exercise tolerance, and extremity edema.    Expected Outcomes Improve functional capacity of life;Short term: Attendance in program 2-3 days a week with increased exercise capacity. Reported lower sodium intake. Reported increased fruit and vegetable intake. Reports medication compliance.;Short term: Daily weights obtained and reported for increase. Utilizing diuretic protocols set by physician.;Long term: Adoption of self-care skills and reduction of barriers for early signs and symptoms recognition and intervention leading to self-care maintenance.    Hypertension Yes    Intervention Provide education on lifestyle modifcations including regular physical activity/exercise, weight management, moderate sodium restriction and increased consumption of fresh fruit, vegetables, and low fat dairy, alcohol moderation, and smoking cessation.;Monitor prescription use compliance.    Expected Outcomes Short Term: Continued assessment and intervention until BP is < 140/63mm HG in hypertensive participants. < 130/76mm HG in hypertensive  participants with diabetes, heart failure or chronic kidney disease.;Long Term: Maintenance of blood pressure at goal levels.    Lipids Yes    Intervention Provide education and support for participant on nutrition & aerobic/resistive exercise along with prescribed medications to achieve LDL 70mg , HDL >40mg .    Expected Outcomes Short Term: Participant states understanding of desired cholesterol values and is compliant with medications prescribed. Participant is following exercise prescription and nutrition guidelines.;Long Term: Cholesterol controlled with medications as prescribed, with individualized exercise RX and with personalized nutrition plan. Value goals: LDL < 70mg , HDL > 40 mg.    Stress Yes    Intervention Offer individual and/or small group education and counseling on adjustment to heart disease,  stress management and health-related lifestyle change. Teach and support self-help strategies.;Refer participants experiencing significant psychosocial distress to appropriate mental health specialists for further evaluation and treatment. When possible, include family members and significant others in education/counseling sessions.    Expected Outcomes Short Term: Participant demonstrates changes in health-related behavior, relaxation and other stress management skills, ability to obtain effective social support, and compliance with psychotropic medications if prescribed.;Long Term: Emotional wellbeing is indicated by absence of clinically significant psychosocial distress or social isolation.          Core Components/Risk Factors/Patient Goals Review:   Goals and Risk Factor Review     Row Name 08/26/24 1637 09/17/24 1712           Core Components/Risk Factors/Patient Goals Review   Personal Goals Review Weight Management/Obesity;Heart Failure;Hypertension;Lipids;Diabetes;Stress Weight Management/Obesity;Heart Failure;Hypertension;Lipids;Diabetes;Stress      Review Andrew Brock started cardiac rehab on 08/26/24. Andrew Brock did well with exercise. Vital signs and CBG were stable. Andrew Brock started cardiac rehab on 08/26/24. Andrew Brock is off to a good start  with exercise. Vital signs and CBG have been stable.      Expected Outcomes Latrel will continue to participate in cardiac rehab for exercise nutrition and lifestyle modifications. Patric will continue to participate in cardiac rehab for exercise nutrition and lifestyle modifications.         Core Components/Risk Factors/Patient Goals at Discharge (Final Review):   Goals and Risk Factor Review - 09/17/24 1712       Core Components/Risk Factors/Patient Goals Review   Personal Goals Review Weight Management/Obesity;Heart Failure;Hypertension;Lipids;Diabetes;Stress    Review Andrew Brock started cardiac rehab on 08/26/24. Andrew Brock is off to a good start  with exercise.  Vital signs and CBG have been stable.    Expected Outcomes Andrew Brock will continue to participate in cardiac rehab for exercise nutrition and lifestyle modifications.          ITP Comments:  ITP Comments     Row Name 08/22/24 0751 08/26/24 1626 09/17/24 1702       ITP Comments Wilbert Bihari, MD: Medical Director. Intorduction to the [employer] / Intensive Cardiac Rehab. Initial orientation packet reviewed with the patient. 30 Day ITP Review. Vedant started cardiac rehab on 08/26/24. Mccartney did well with exercise. 30 Day ITP Review. Rayshaun has good attendance and participaiton cardiac with exercise at cardiac rehab.        Comments: See ITP comments.[name] RN BSN

## 2024-09-18 ENCOUNTER — Encounter (HOSPITAL_COMMUNITY)
Admission: RE | Admit: 2024-09-18 | Discharge: 2024-09-18 | Disposition: A | Source: Ambulatory Visit | Attending: Cardiology

## 2024-09-18 DIAGNOSIS — Z9861 Coronary angioplasty status: Secondary | ICD-10-CM

## 2024-09-18 DIAGNOSIS — I2102 ST elevation (STEMI) myocardial infarction involving left anterior descending coronary artery: Secondary | ICD-10-CM

## 2024-09-18 DIAGNOSIS — I252 Old myocardial infarction: Secondary | ICD-10-CM | POA: Diagnosis not present

## 2024-09-23 ENCOUNTER — Ambulatory Visit (HOSPITAL_COMMUNITY)
Admission: RE | Admit: 2024-09-23 | Discharge: 2024-09-23 | Disposition: A | Discharge status: Home / Self Care | Source: Ambulatory Visit | Attending: Internal Medicine | Admitting: Internal Medicine

## 2024-09-23 ENCOUNTER — Encounter (HOSPITAL_COMMUNITY)
Admission: RE | Admit: 2024-09-23 | Discharge: 2024-09-23 | Disposition: A | Discharge status: Home / Self Care | Source: Ambulatory Visit | Attending: Cardiology | Admitting: Cardiology

## 2024-09-23 DIAGNOSIS — I252 Old myocardial infarction: Secondary | ICD-10-CM | POA: Diagnosis not present

## 2024-09-23 DIAGNOSIS — I5022 Chronic systolic (congestive) heart failure: Secondary | ICD-10-CM | POA: Insufficient documentation

## 2024-09-23 DIAGNOSIS — I2102 ST elevation (STEMI) myocardial infarction involving left anterior descending coronary artery: Secondary | ICD-10-CM

## 2024-09-23 DIAGNOSIS — Z9861 Coronary angioplasty status: Secondary | ICD-10-CM

## 2024-09-23 LAB — BASIC METABOLIC PANEL WITH GFR
Anion gap: 11 (ref 5–15)
BUN: 15 mg/dL (ref 6–20)
CO2: 24 mmol/L (ref 22–32)
Calcium: 9 mg/dL (ref 8.9–10.3)
Chloride: 104 mmol/L (ref 98–111)
Creatinine, Ser: 1.27 mg/dL — ABNORMAL HIGH (ref 0.61–1.24)
GFR, Estimated: >60 mL/min (ref >60)
Glucose, Bld: 133 mg/dL — ABNORMAL HIGH (ref 70–99)
Potassium: 4 mmol/L (ref 3.5–5.1)
Sodium: 139 mmol/L (ref 135–145)

## 2024-09-25 ENCOUNTER — Encounter (HOSPITAL_COMMUNITY)
Admission: RE | Admit: 2024-09-25 | Discharge: 2024-09-25 | Disposition: A | Source: Ambulatory Visit | Attending: Cardiology

## 2024-09-25 DIAGNOSIS — I2102 ST elevation (STEMI) myocardial infarction involving left anterior descending coronary artery: Secondary | ICD-10-CM

## 2024-09-25 DIAGNOSIS — Z9861 Coronary angioplasty status: Secondary | ICD-10-CM

## 2024-09-25 DIAGNOSIS — I252 Old myocardial infarction: Secondary | ICD-10-CM | POA: Diagnosis not present

## 2024-09-26 ENCOUNTER — Encounter (HOSPITAL_COMMUNITY): Payer: Self-pay

## 2024-09-30 ENCOUNTER — Encounter (HOSPITAL_COMMUNITY)
Admission: RE | Admit: 2024-09-30 | Discharge: 2024-09-30 | Disposition: A | Discharge status: Home / Self Care | Source: Ambulatory Visit | Attending: Cardiology | Admitting: Cardiology

## 2024-09-30 DIAGNOSIS — I2102 ST elevation (STEMI) myocardial infarction involving left anterior descending coronary artery: Secondary | ICD-10-CM

## 2024-09-30 DIAGNOSIS — I252 Old myocardial infarction: Secondary | ICD-10-CM | POA: Diagnosis not present

## 2024-09-30 DIAGNOSIS — Z9861 Coronary angioplasty status: Secondary | ICD-10-CM

## 2024-10-01 NOTE — Progress Notes (Unsigned)
 Cardiology Office Note:  .   Date:  10/03/2024  ID:  Andrew Brock, DOB 04-30-1961, MRN [id number] PCP: [facility] Providers Cardiologist:  None {  History of Present Illness: .    Chief Complaint  Patient presents with   Congestive Heart Failure    Andrew Brock is a 63 y.o. male with history of CAD, HFrEF, pAF who presents for the evaluation of CHF at the request of Clinic, [facility].   History of Present Illness   Andrew Brock is a 63 year old male with coronary artery disease and heart failure who presents for follow-up after a recent anterior STEMI and hospitalization.  He has a history of coronary artery disease, complicated by an anterior ST-elevation myocardial infarction (STEMI) in July 2025, which led to cardiogenic shock and pericardial effusion, requiring pericardiocentesis. He was hospitalized for 23 to 25 days, including 11 days in the ICU. He experiences mental fluctuations, having 'good and bad days' as he processes the events. Initially, he thought the issue was a simple blockage that could be resolved with angioplasty, but later learned that the artery had ruptured during the procedure, causing bleeding outside the artery.  He has a history of congestive heart failure. Symptoms included fluid buildup, swelling in his ankles and feet, and significant weight gain, which he attributed to his heart condition. Despite a healthy lifestyle, including dietary changes and regular exercise, he experienced a burning sensation in his chest on the day of the heart attack. He was assisting someone at a hotel when he required an ambulance for his own heart attack.  He is currently on several medications for his heart condition, including digoxin  0.125 mg Brock, Jardiance  10 mg Brock, metoprolol  succinate 25 mg Brock, losartan  25 mg Brock, spironolactone  25 mg Brock, and torsemide  20 mg Brock. He also takes aspirin  81 mg Brock and Eliquis  5 mg  twice Brock for paroxysmal atrial fibrillation. He experiences frequent urination after taking his medications, which subsides by midday.  His family history includes his father, who died of a heart attack, and his mother, who had congestive heart failure. He has no known history of high blood pressure but is currently on medication for his heart. He has a history of diabetes and no significant history of smoking, alcohol, or drug use. 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  No current chest pain or trouble breathing, although he experienced chest tightness the previous day, which he attributed to awkward stretching. No current swelling or fluid retention.          Problem List CAD -anterior STEMI 06/28/2024 -c/b cardiogenic shock and LAD perforation s/p pericardiocentesis  2. HFrEF -EF 20-25% 06/2024 3. Paroxysmal Afib  4. DM -A1c 7.8 5. HLD  -T chol 109, HDL 44, LDL 46, TG 101    ROS: All other ROS reviewed and negative. Pertinent positives noted in the HPI.     Studies Reviewed: SABRA       TTE 07/05/2024  1. Left ventricular ejection fraction, by estimation, is 20 to 25%. The  left ventricle has severely decreased function.   2. Right ventricular systolic function is severely reduced. The right  ventricular size is normal. There is normal pulmonary artery systolic  pressure.   3. There is no evidence of pericardial effusion.   4. The mitral valve is normal in structure. Mild mitral valve  regurgitation.   5. The inferior vena cava is normal in size with greater than 50%  respiratory variability, suggesting right atrial pressure of 3 mmHg.  Physical Exam:   VS:  BP 128/78   Pulse 84   Ht 5' 11 (1.803 m)   Wt 286 lb 6.4 oz (129.9 kg)   SpO2 97%   BMI 39.94 kg/m    Wt Readings from Last 3 Encounters:  10/03/24 286 lb 6.4 oz (129.9 kg)  09/09/24 290 lb 3.2 oz (131.6 kg)  08/22/24 289 lb  7.4 oz (131.3 kg)    GEN: Well nourished, well developed in no acute distress NECK: No JVD; No carotid bruits CARDIAC: RRR, no murmurs, rubs, gallops RESPIRATORY:  Clear to auscultation without rales, wheezing or rhonchi  ABDOMEN: Soft, non-tender, non-distended EXTREMITIES:  No edema; No deformity  ASSESSMENT AND PLAN: .   Assessment and Plan    Chronic systolic heart failure with reduced ejection fraction Chronic systolic heart failure, likely pre-existing before anterior STEMI in July 2025. Currently well compensated. Transitioning to Entresto for improved cardiac function. Euvolemic status noted. - Stop losartan . Start Entresto 24/26 mg twice Brock. - Take torsemide  20mg  Brock as needed. - Continue digoxin  0.125 mg Brock. - Continue Jardiance  10 mg Brock. - Continue metoprolol  succinate 25 mg Brock. - Continue Aldactone  25 mg Brock. - Order repeat echocardiogram in a few weeks. - Consider cardiac MRI if ejection fraction remains low. - Discuss potential ICD placement if indicated.  Paroxysmal atrial fibrillation Paroxysmal atrial fibrillation managed with Eliquis  and amiodarone . Discussed catheter ablation with heart failure clinic. Amiodarone  not for long-term use. - Continue Eliquis  5 mg BID. - Prescribe amiodarone  200 mg Brock. - Discuss catheter ablation with heart failure clinic.  Atherosclerotic heart disease with anterior STEMI, distal LAD, status post failed angioplasty Anterior STEMI in distal LAD, likely embolic. Status post failed angioplasty. Discussed medication role in management and prevention. - Continue aspirin  81 mg Brock.  Type 2 diabetes mellitus Type 2 diabetes mellitus with recent A1c of 7.8.  Hyperlipidemia Hyperlipidemia managed with Lipitor . Recent LDL level is 46, indicating good control. - Continue Lipitor  80 mg Brock.              Follow-up: Return if symptoms worsen or fail to improve.   Signed, Darryle DASEN. Barbaraann, MD, [facility]   [facility]  [address] [place], [place] [address] [phone]  9:37 AM

## 2024-10-02 ENCOUNTER — Encounter (HOSPITAL_COMMUNITY): Admission: RE | Admit: 2024-10-02 | Source: Ambulatory Visit

## 2024-10-03 ENCOUNTER — Other Ambulatory Visit (HOSPITAL_COMMUNITY): Payer: Self-pay

## 2024-10-03 ENCOUNTER — Telehealth: Payer: Self-pay

## 2024-10-03 ENCOUNTER — Telehealth: Payer: Self-pay | Admitting: Pharmacy Technician

## 2024-10-03 ENCOUNTER — Ambulatory Visit: Attending: Internal Medicine | Admitting: Cardiovascular Disease

## 2024-10-03 ENCOUNTER — Encounter: Payer: Self-pay | Admitting: Cardiovascular Disease

## 2024-10-03 VITALS — BP 128/78 | HR 84 | Ht 71.0 in | Wt 286.4 lb

## 2024-10-03 DIAGNOSIS — I2510 Atherosclerotic heart disease of native coronary artery without angina pectoris: Secondary | ICD-10-CM | POA: Diagnosis not present

## 2024-10-03 DIAGNOSIS — I3139 Other pericardial effusion (noninflammatory): Secondary | ICD-10-CM | POA: Diagnosis not present

## 2024-10-03 DIAGNOSIS — I480 Paroxysmal atrial fibrillation: Secondary | ICD-10-CM

## 2024-10-03 DIAGNOSIS — I5022 Chronic systolic (congestive) heart failure: Secondary | ICD-10-CM | POA: Diagnosis not present

## 2024-10-03 MED ORDER — TORSEMIDE 20 MG PO TABS: 20.0000 mg | ORAL_TABLET | Freq: Every day | ORAL | 6 refills | Status: DC | PRN

## 2024-10-03 MED ORDER — AMIODARONE HCL 200 MG PO TABS: 200.0000 mg | ORAL_TABLET | Freq: Every day | ORAL | 3 refills | Status: DC

## 2024-10-03 MED ORDER — SACUBITRIL-VALSARTAN 24-26 MG PO TABS: 1.0000 | ORAL_TABLET | Freq: Two times a day (BID) | ORAL | 6 refills | Status: DC

## 2024-10-03 MED ORDER — TORSEMIDE 20 MG PO TABS
20.0000 mg | ORAL_TABLET | Freq: Every day | ORAL | 6 refills | Status: DC | PRN
  Filled 2024-10-03: qty 30, 30d supply, fill #0

## 2024-10-03 MED ORDER — AMIODARONE HCL 200 MG PO TABS
200.0000 mg | ORAL_TABLET | Freq: Every day | ORAL | 3 refills | Status: AC
  Filled 2024-10-03: qty 30, 30d supply, fill #0
  Filled 2024-11-25 – 2024-12-07 (×2): qty 30, 30d supply, fill #1
  Filled 2025-01-04: qty 30, 30d supply, fill #2

## 2024-10-03 NOTE — Telephone Encounter (Signed)
   Pharmacy Patient Advocate Encounter   Received notification from Pt Calls Messages that prior authorization for sacubitril/valsartan is required/requested.   Insurance verification completed.   The patient is insured through [employer].   Per  pa:    Couldn't do test claim - it said product not appropriate for this location

## 2024-10-03 NOTE — Patient Instructions (Addendum)
 Medication Instructions:  Your physician has recommended you make the following change in your medication:   -Change torsemide  (demadex ) to 20mg  as needed once daily.  -Stay on Losartan  (cozaar ) 12.5mg  daily until we get prior auth for Entresto 24-26mg  twice daily.  *If you need a refill on your cardiac medications before your next appointment, please call your pharmacy*   Follow-Up: At [facility], you and your health needs are our priority.  As part of our continuing mission to provide you with exceptional heart care, our providers are all part of one team.  This team includes your primary Cardiologist (physician) and Advanced Practice Providers or APPs (Physician Assistants and Nurse Practitioners) who all work together to provide you with the care you need, when you need it.  Your next appointment:   Wednesday, November 19th @ 9am   Provider:   Dr. Zenaida

## 2024-10-03 NOTE — Telephone Encounter (Signed)
 Per Prior Auth team, pt does not need a PA for generic entresto. Prescription sent to pt's preferred pharmacy and mychart message sent to pt with instructions.

## 2024-10-07 ENCOUNTER — Encounter (HOSPITAL_COMMUNITY)
Admission: RE | Admit: 2024-10-07 | Discharge: 2024-10-07 | Disposition: A | Source: Ambulatory Visit | Attending: Cardiology

## 2024-10-07 DIAGNOSIS — Z9861 Coronary angioplasty status: Secondary | ICD-10-CM

## 2024-10-07 DIAGNOSIS — I252 Old myocardial infarction: Secondary | ICD-10-CM | POA: Diagnosis not present

## 2024-10-07 DIAGNOSIS — I2102 ST elevation (STEMI) myocardial infarction involving left anterior descending coronary artery: Secondary | ICD-10-CM

## 2024-10-09 ENCOUNTER — Encounter (HOSPITAL_COMMUNITY)
Admission: RE | Admit: 2024-10-09 | Discharge: 2024-10-09 | Disposition: A | Discharge status: Home / Self Care | Source: Ambulatory Visit | Attending: Cardiology | Admitting: Cardiology

## 2024-10-10 NOTE — Progress Notes (Signed)
 Cardiac Individual Treatment Plan  Patient Details  Name: Andrew Brock MRN: [id number] Date of Birth: Aug 04, 1961 Referring Provider:   Flowsheet Row CARDIAC REHAB PHASE II ORIENTATION from 08/22/2024 in [facility]  Referring Provider Morene Brownie, MD    Initial Encounter Date:  Flowsheet Row CARDIAC REHAB PHASE II ORIENTATION from 08/22/2024 in [facility]  Date 08/22/24    Visit Diagnosis: 06/28/24 ST elevation myocardial infarction involving left anterior descending (LAD) coronary artery (HCC)  06/28/24 S/P PTCA (percutaneous transluminal coronary angioplasty)  Patient's Home Medications on Admission:  Current Outpatient Medications:    albuterol (VENTOLIN HFA) 108 (90 Base) MCG/ACT inhaler, Inhale 1 puff into the lungs every 4 (four) hours as needed for shortness of breath or wheezing., Disp: , Rfl:    amiodarone  (PACERONE ) 200 MG tablet, Take 1 tablet (200 mg total) by mouth daily., Disp: 90 tablet, Rfl: 3   apixaban  (ELIQUIS ) 5 MG TABS tablet, Take 1 tablet (5 mg total) by mouth 2 (two) times daily., Disp: 60 tablet, Rfl: 6   aspirin  EC 81 MG tablet, Take 81 mg by mouth daily. Swallow whole., Disp: , Rfl:    atorvastatin  (LIPITOR ) 80 MG tablet, Take 80 mg by mouth daily., Disp: , Rfl:    colchicine  0.6 MG tablet, Take 1 tablet (0.6 mg total) by mouth daily. (Patient not taking: Reported on 10/03/2024), Disp: 30 tablet, Rfl: 3   digoxin  (LANOXIN ) 0.125 MG tablet, Take 1 tablet (0.125 mg total) by mouth daily., Disp: 30 tablet, Rfl: 6   empagliflozin  (JARDIANCE ) 10 MG TABS tablet, Take 1 tablet (10 mg total) by mouth daily., Disp: 30 tablet, Rfl: 6   insulin  glargine (LANTUS ) 100 UNIT/ML injection, Inject 34 Units into the skin daily., Disp: , Rfl:    metFORMIN (GLUCOPHAGE) 1000 MG tablet, Take 1,000 mg by mouth 2 (two) times daily with a meal., Disp: , Rfl:     metoprolol  succinate (TOPROL -XL) 25 MG 24 hr tablet, Take 1 tablet (25 mg total) by mouth daily. Take with or immediately following a meal., Disp: 90 tablet, Rfl: 3   omeprazole (PRILOSEC) 20 MG capsule, Take 20 mg by mouth daily., Disp: , Rfl:    sacubitril-valsartan (ENTRESTO) 24-26 MG, Take 1 tablet by mouth 2 (two) times daily., Disp: 60 tablet, Rfl: 6   spironolactone  (ALDACTONE ) 25 MG tablet, Take 1 tablet (25 mg total) by mouth daily., Disp: 30 tablet, Rfl: 6   torsemide  (DEMADEX ) 20 MG tablet, Take 1 tablet (20 mg total) by mouth daily as needed., Disp: 30 tablet, Rfl: 6  Past Medical History: Past Medical History:  Diagnosis Date   Cancer (HCC)    hodgkin lymphoma 2005, treated in [place]   CHF (congestive heart failure) (HCC)    Coronary artery disease    Diabetes mellitus (HCC)    Hyperlipidemia    Hypertension    Lateral meniscus tear    right knee   Plantar fasciitis     Tobacco Use: Social History   Tobacco Use  Smoking Status Never  Smokeless Tobacco Never    Labs: Review Flowsheet  More data exists      Latest Ref Rng & Units 07/04/2024 07/05/2024 07/06/2024 07/07/2024 07/08/2024  Labs for ITP Cardiac and Pulmonary Rehab  O2 Saturation % 62.9  56.2  59.8  61.2  55.7     Capillary Blood Glucose: Lab Results  Component Value Date  GLUCAP 181 (H) 08/28/2024   GLUCAP 175 (H) 08/28/2024   GLUCAP 149 (H) 08/26/2024   GLUCAP 171 (H) 08/26/2024   GLUCAP 139 (H) 07/08/2024     Exercise Target Goals: Exercise Program Goal: Individual exercise prescription set using results from initial 6 min walk test and THRR while considering  patient's activity barriers and safety.   Exercise Prescription Goal: Initial exercise prescription builds to 30-45 minutes a day of aerobic activity, 2-3 days per week.  Home exercise guidelines will be given to patient during program as part of exercise prescription that the participant will acknowledge.  Activity Barriers & Risk  Stratification:  Activity Barriers & Cardiac Risk Stratification - 08/22/24 0840       Activity Barriers & Cardiac Risk Stratification   Activity Barriers Joint Problems;Deconditioning;Muscular Weakness;Balance Concerns;Decreased Ventricular Function    Cardiac Risk Stratification High          6 Minute Walk:  6 Minute Walk     Row Name 08/22/24 0939         6 Minute Walk   Phase Initial     Distance 1253 feet     Walk Time 6 minutes     # of Rest Breaks 0     MPH 2.4     METS 2.43     RPE 6     Perceived Dyspnea  0     VO2 Peak 8.5     Symptoms Yes (comment)     Comments 5/10 chronic bilateral knee pain     Resting HR 93 bpm     Resting BP 112/60     Resting Oxygen Saturation  98 %     Exercise Oxygen Saturation  during 6 min walk 97 %     Max Ex. HR 107 bpm     Max Ex. BP 118/70     2 Minute Post BP 102/70        Oxygen Initial Assessment:   Oxygen Re-Evaluation:   Oxygen Discharge (Final Oxygen Re-Evaluation):   Initial Exercise Prescription:  Initial Exercise Prescription - 08/22/24 0800       Date of Initial Exercise RX and Referring Provider   Date 08/22/24    Referring Provider Morene Brownie, MD    Expected Discharge Date 10/21/24      NuStep   Level 1    SPM 75    Minutes 15    METs 2.4      Arm Ergometer   Level 1    Watts 25    RPM 60    Minutes 15    METs 2.4      Prescription Details   Frequency (times per week) 2    Duration Progress to 30 minutes of continuous aerobic without signs/symptoms of physical distress      Intensity   THRR 40-80% of Max Heartrate 64-128    Ratings of Perceived Exertion 11-13    Perceived Dyspnea 0-4      Progression   Progression Continue progressive overload as per policy without signs/symptoms or physical distress.      Resistance Training   Training Prescription Yes    Weight 4lbs    Reps 10-15          Perform Capillary Blood Glucose checks as needed.  Exercise Prescription  Changes:   Exercise Prescription Changes     Row Name 08/26/24 1200 09/04/24 1652 09/11/24 1030 09/23/24 1239       Response to Exercise   Blood  Pressure (Admit) 114/64 106/70 112/72 96/68    Blood Pressure (Exercise) 108/78 130/70 114/78 108/62    Blood Pressure (Exit) 110/60 98/66 96/62  110/64    Heart Rate (Admit) 90 bpm 91 bpm 83 bpm 96 bpm    Heart Rate (Exercise) 113 bpm 103 bpm 107 bpm 108 bpm    Heart Rate (Exit) 99 bpm 87 bpm 84 bpm 93 bpm    Rating of Perceived Exertion (Exercise) 7 11 7 9     Symptoms None None None None    Comments Pt's first day in the CRP2 program Reviewed home exercise Rx Reviewed METs Reviewed METs and goals    Duration Continue with 30 min of aerobic exercise without signs/symptoms of physical distress. Continue with 30 min of aerobic exercise without signs/symptoms of physical distress. Continue with 30 min of aerobic exercise without signs/symptoms of physical distress. Continue with 30 min of aerobic exercise without signs/symptoms of physical distress.    Intensity THRR unchanged THRR unchanged THRR unchanged THRR unchanged      Progression   Progression Continue to progress workloads to maintain intensity without signs/symptoms of physical distress. Continue to progress workloads to maintain intensity without signs/symptoms of physical distress. Continue to progress workloads to maintain intensity without signs/symptoms of physical distress. Continue to progress workloads to maintain intensity without signs/symptoms of physical distress.    Average METs 1.7 2.1 1.9  METs from Nustep missing data 2.25      Resistance Training   Training Prescription Yes No No Yes    Weight 4lbs No wts on Wednesdays -- 4 lbs    Reps 10-15 -- -- 10-15    Time 5 Minutes -- -- 5 Minutes      Interval Training   Interval Training No No No No      NuStep   Level 2 3 4 4     SPM 72 83 -- 97    Minutes 15 15 15 15     METs 1.8 2.1 -- 2.7      Arm Ergometer   Level 2  3 3  3.5    Watts 16 25 -- 22    RPM 45 55 -- 48    Minutes 15 15 15 15     METs 1.6 2 1.9 1.8      Home Exercise Plan   Plans to continue exercise at -- Home (comment) Home (comment) Home (comment)    Frequency -- Add 2 additional days to program exercise sessions. Add 2 additional days to program exercise sessions. Add 2 additional days to program exercise sessions.    Initial Home Exercises Provided -- 09/04/24 09/04/24 09/04/24       Exercise Comments:   Exercise Comments     Row Name 08/26/24 1226 09/04/24 1222 09/11/24 0800 09/23/24 1245 10/09/24 1412   Exercise Comments Pt's first day in the CRP2 program. Pt exericse without complaints and increse workloads on both modalities today. Reviewed home exercise Rx with patient today. Pt voices he has been swimming in the pool and would like to continue as long as the pool is available,  or walk. Pt verbalized understanding of the home exercise Rx and was provided a copy. Reviewed METs with patient. Pt is making progress. Peak METs have gone from 1.8 to 2.4. Reviewed METs and goals. Pt voices he is tried today as he had to work 3rd shift last night. Pt did what he felt he able to do on his workout today. Pt is progressing on his METs. Pt  voices stressors regarding his work and car. Provided emotional support for his ongoing challanges. Pt due for MET review today, did not attend. Will complete upon return next session.      Exercise Goals and Review:   Exercise Goals     Row Name 08/22/24 0751             Exercise Goals   Increase Physical Activity Yes       Intervention Provide advice, education, support and counseling about physical activity/exercise needs.;Develop an individualized exercise prescription for aerobic and resistive training based on initial evaluation findings, risk stratification, comorbidities and participant's personal goals.       Expected Outcomes Short Term: Attend rehab on a regular basis to increase amount of  physical activity.;Long Term: Add in home exercise to make exercise part of routine and to increase amount of physical activity.;Long Term: Exercising regularly at least 3-5 days a week.       Increase Strength and Stamina Yes       Intervention Provide advice, education, support and counseling about physical activity/exercise needs.;Develop an individualized exercise prescription for aerobic and resistive training based on initial evaluation findings, risk stratification, comorbidities and participant's personal goals.       Expected Outcomes Short Term: Increase workloads from initial exercise prescription for resistance, speed, and METs.;Short Term: Perform resistance training exercises routinely during rehab and add in resistance training at home;Long Term: Improve cardiorespiratory fitness, muscular endurance and strength as measured by increased METs and functional capacity ( )       Able to understand and use rate of perceived exertion (RPE) scale Yes       Intervention Provide education and explanation on how to use RPE scale       Expected Outcomes Short Term: Able to use RPE daily in rehab to express subjective intensity level;Long Term:  Able to use RPE to guide intensity level when exercising independently       Knowledge and understanding of Target Heart Rate Range (THRR) Yes       Intervention Provide education and explanation of THRR including how the numbers were predicted and where they are located for reference       Expected Outcomes Short Term: Able to state/look up THRR;Short Term: Able to use daily as guideline for intensity in rehab;Long Term: Able to use THRR to govern intensity when exercising independently       Understanding of Exercise Prescription Yes       Intervention Provide education, explanation, and written materials on patient's individual exercise prescription       Expected Outcomes Short Term: Able to explain program exercise prescription;Long Term: Able to  explain home exercise prescription to exercise independently          Exercise Goals Re-Evaluation :  Exercise Goals Re-Evaluation     Row Name 08/26/24 1224 09/23/24 1243           Exercise Goal Re-Evaluation   Exercise Goals Review Increase Physical Activity;Increase Strength and Stamina;Able to understand and use rate of perceived exertion (RPE) scale;Knowledge and understanding of Target Heart Rate Range (THRR);Understanding of Exercise Prescription Increase Physical Activity;Increase Strength and Stamina;Able to understand and use rate of perceived exertion (RPE) scale;Knowledge and understanding of Target Heart Rate Range (THRR);Understanding of Exercise Prescription      Comments Pt's first day in the CRP2 program. Pt understnads the exercise Rx, RPE scale and THRR. Reviewed METs and goals. Pt voices progress on his goals of imrpoved strength and  stamina. Pt is exercising at home 1-2x/week in the community gym. Pt also has goal of weight loss but has not made progress on this to date. Continue to encourage patient to make healthy dietary items.      Expected Outcomes Will continue to montior patient and progress exericse workloads as tolerated. Will continue to montior patient and progress exericse workloads as tolerated.         Discharge Exercise Prescription (Final Exercise Prescription Changes):  Exercise Prescription Changes - 09/23/24 1239       Response to Exercise   Blood Pressure (Admit) 96/68    Blood Pressure (Exercise) 108/62    Blood Pressure (Exit) 110/64    Heart Rate (Admit) 96 bpm    Heart Rate (Exercise) 108 bpm    Heart Rate (Exit) 93 bpm    Rating of Perceived Exertion (Exercise) 9    Symptoms None    Comments Reviewed METs and goals    Duration Continue with 30 min of aerobic exercise without signs/symptoms of physical distress.    Intensity THRR unchanged      Progression   Progression Continue to progress workloads to maintain intensity without  signs/symptoms of physical distress.    Average METs 2.25      Resistance Training   Training Prescription Yes    Weight 4 lbs    Reps 10-15    Time 5 Minutes      Interval Training   Interval Training No      NuStep   Level 4    SPM 97    Minutes 15    METs 2.7      Arm Ergometer   Level 3.5    Watts 22    RPM 48    Minutes 15    METs 1.8      Home Exercise Plan   Plans to continue exercise at Home (comment)    Frequency Add 2 additional days to program exercise sessions.    Initial Home Exercises Provided 09/04/24          Nutrition:  Target Goals: Understanding of nutrition guidelines, daily intake of sodium 1500mg , cholesterol 200mg , calories 30% from fat and 7% or less from saturated fats, daily to have 5 or more servings of fruits and vegetables.  Biometrics:  Pre Biometrics - 08/22/24 0800       Pre Biometrics   Waist Circumference 55 inches    Hip Circumference 51 inches    Waist to Hip Ratio 1.08 %    Triceps Skinfold 11 mm    % Body Fat 38.7 %    Grip Strength 40 kg    Flexibility 9 in    Single Leg Stand 8.68 seconds           Nutrition Therapy Plan and Nutrition Goals:   Nutrition Assessments:  MEDIFICTS Score Key: >=70 Need to make dietary changes  40-70 Heart Healthy Diet <= 40 Therapeutic Level Cholesterol Diet   Flowsheet Row CARDIAC REHAB PHASE II EXERCISE from 08/26/2024 in [facility]  Picture Your Plate Total Score on Admission 58   Picture Your Plate Scores: <59 Unhealthy dietary pattern with much room for improvement. 41-50 Dietary pattern unlikely to meet recommendations for good health and room for improvement. 51-60 More healthful dietary pattern, with some room for improvement.  >60 Healthy dietary pattern, although there may be some specific behaviors that could be improved.    Nutrition Goals Re-Evaluation:  Nutrition Goals  Re-Evaluation:   Nutrition Goals Discharge (Final Nutrition Goals Re-Evaluation):   Psychosocial: Target Goals: Acknowledge presence or absence of significant depression and/or stress, maximize coping skills, provide positive support system. Participant is able to verbalize types and ability to use techniques and skills needed for reducing stress and depression.  Initial Review & Psychosocial Screening:  Initial Psych Review & Screening - 08/22/24 1142       Initial Review   Current issues with Current Depression;Current Sleep Concerns;Current Anxiety/Panic;Current Stress Concerns    Source of Stress Concerns Chronic Illness      Family Dynamics   Good Support System? Yes   has brother for support     Barriers   Psychosocial barriers to participate in program The patient should benefit from training in stress management and relaxation.      Screening Interventions   Interventions Encouraged to exercise;Other (comment)    [facility] of anitdepressant meds. Used in past and voices did not change the way he felt.    Expected Outcomes Short Term goal: Utilizing psychosocial counselor, staff and physician to assist with identification of specific Stressors or current issues interfering with healing process. Setting desired goal for each stressor or current issue identified.;Long Term Goal: Stressors or current issues are controlled or eliminated.;Short Term goal: Identification and review with participant of any Quality of Life or Depression concerns found by scoring the questionnaire.;Long Term goal: The participant improves quality of Life and PHQ9 Scores as seen by post scores and/or verbalization of changes          Quality of Life Scores:  Quality of Life - 08/22/24 1146       Quality of Life   Select Quality of Life      Quality of Life Scores   Health/Function Pre 24.6 %    Socioeconomic Pre 27.75 %    Psych/Spiritual Pre 25.57 %     Family Pre 30 %    GLOBAL Pre 26.08 %         Scores of 19 and below usually indicate a poorer quality of life in these areas.  A difference of  2-3 points is a clinically meaningful difference.  A difference of 2-3 points in the total score of the Quality of Life Index has been associated with significant improvement in overall quality of life, self-image, physical symptoms, and general health in studies assessing change in quality of life.  PHQ-9: Review Flowsheet       08/22/2024  Depression screen PHQ 2/9  Decreased Interest 0  Down, Depressed, Hopeless 2  PHQ - 2 Score 2  Altered sleeping 1  Tired, decreased energy 0  Change in appetite 2  Feeling bad or failure about yourself  0  Trouble concentrating 0  Moving slowly or fidgety/restless 0  Suicidal thoughts 0  PHQ-9 Score 5  Difficult doing work/chores Not difficult at all   Interpretation of Total Score  Total Score Depression Severity:  1-4 = Minimal depression, 5-9 = Mild depression, 10-14 = Moderate depression, 15-19 = Moderately severe depression, 20-27 = Severe depression   Psychosocial Evaluation and Intervention:   Psychosocial Re-Evaluation:  Psychosocial Re-Evaluation     Row Name 08/26/24 1632 09/17/24 1707 10/10/24 1500         Psychosocial Re-Evaluation   Current issues with Current Anxiety/Panic;Current Stress Concerns;Current Sleep Concerns;Current Depression Current Anxiety/Panic;Current Stress Concerns;Current Sleep Concerns;Current Depression Current Anxiety/Panic;Current Stress Concerns;Current Sleep Concerns;Current Depression  Comments Mykeal started cardiac rehab, Jabre did reports stress concerns regarding transportation resources provided Abdul is returning to work on 09/16/24. Thedore is hopeful that his short term disability will be reinstated so that he can receive back pay. Jayleon also hopes that his car will be repaired. Vasili says he is ina better mental state. Jayan has returned to  work. Kevis continues to have work done to his car.  Alden says things are better for him now that he has income     Expected Outcomes Wadsworth will have controlled or decreased depression/ stressors upon completion of cardiac rehab. Iniko will have controlled or decreased depression/ stressors upon completion of cardiac rehab. Colvin will have controlled or decreased depression/ stressors upon completion of cardiac rehab.     Interventions Stress management education;Relaxation education;Encouraged to attend Cardiac Rehabilitation for the exercise Stress management education;Relaxation education;Encouraged to attend Cardiac Rehabilitation for the exercise Stress management education;Relaxation education;Encouraged to attend Cardiac Rehabilitation for the exercise     Continue Psychosocial Services  Follow up required by staff Follow up required by staff Follow up required by staff       Initial Review   Source of Stress Concerns Chronic Illness;Transportation Chronic Illness;Transportation Chronic Illness;Transportation     Comments Will continue to monitor and offer support as needed. Will continue to monitor and offer support as needed. Will continue to monitor and offer support as needed.        Psychosocial Discharge (Final Psychosocial Re-Evaluation):  Psychosocial Re-Evaluation - 10/10/24 1500       Psychosocial Re-Evaluation   Current issues with Current Anxiety/Panic;Current Stress Concerns;Current Sleep Concerns;Current Depression    Comments Winter has returned to work. Koston continues to have work done to his car.  Dominick says things are better for him now that he has income    Expected Outcomes Colter will have controlled or decreased depression/ stressors upon completion of cardiac rehab.    Interventions Stress management education;Relaxation education;Encouraged to attend Cardiac Rehabilitation for the exercise    Continue Psychosocial Services  Follow up required by staff      Initial  Review   Source of Stress Concerns Chronic Illness;Transportation    Comments Will continue to monitor and offer support as needed.          Vocational Rehabilitation: Provide vocational rehab assistance to qualifying candidates.   Vocational Rehab Evaluation & Intervention:  Vocational Rehab - 08/22/24 1145       Initial Vocational Rehab Evaluation & Intervention   Assessment shows need for Vocational Rehabilitation No   plans to return to his job when [employer] cleared         Education: Education Goals: Education classes will be provided on a weekly basis, covering required topics. Participant will state understanding/return demonstration of topics presented.    Education     Row Name 08/26/24 0800     Education   Cardiac Education Topics Pritikin   Select Core Videos     Core Videos   Educator Exercise Physiologist   Select Exercise Education   Exercise Education Biomechanial Limitations   Instruction Review Code 1- Verbalizes Understanding   Class Start Time [phone]   Class Stop Time 0846   Class Time Calculation (min) 35 min    Row Name 08/28/24 0800     Education   Cardiac Education Topics Pritikin   Dentist   Weekly Topic [employer]   Instruction  Review Code 1- Verbalizes Understanding   Class Start Time 0815   Class Stop Time 0847   Class Time Calculation (min) 32 min    Row Name 09/02/24 0800     Education   Cardiac Education Topics Pritikin   Select Core Videos     Core Videos   Educator Exercise Physiologist   Select Exercise Education   Exercise Education Improving Performance   Instruction Review Code 1- Verbalizes Understanding   Class Start Time [phone]   Class Stop Time 0849   Class Time Calculation (min) 37 min    Row Name 09/09/24 0800     Education   Cardiac Education Topics Pritikin   Select Workshops     Workshops   Educator Exercise Physiologist   Select  Psychosocial   Psychosocial Workshop Healthy Sleep for a Healthy Heart   Instruction Review Code 1- Verbalizes Understanding   Class Start Time 0815   Class Stop Time 0857   Class Time Calculation (min) 42 min    Row Name 09/11/24 0800     Education   Cardiac Education Topics Pritikin   Secondary School Teacher School   Educator Nurse;Respiratory Therapist   Weekly Topic Simple Sides and Sauces   Instruction Review Code 1- Verbalizes Understanding   Class Start Time [phone]   Class Stop Time 0854   Class Time Calculation (min) 43 min    Row Name 09/16/24 0800     Education   Cardiac Education Topics Pritikin   Psychologist, Forensic General Education   General Education Heart Disease Risk Reduction   Instruction Review Code 1- Verbalizes Understanding   Class Start Time 0818   Class Stop Time 0853   Class Time Calculation (min) 35 min    Row Name 09/18/24 0900     Education   Cardiac Education Topics Pritikin   Orthoptist   Educator Nurse   Weekly Topic Power[employer] Plant-[employer]   Instruction Review Code 1- Verbalizes Understanding   Class Start Time 0815   Class Stop Time 0848   Class Time Calculation (min) 33 min    Row Name 09/23/24 0800     Education   Cardiac Education Topics Pritikin   Geographical Information Systems Officer Psychosocial   Psychosocial Workshop From Head to Heart: The Power of a Healthy Outlook   Instruction Review Code 1- Verbalizes Understanding   Class Start Time [phone]   Class Stop Time 0857   Class Time Calculation (min) 43 min    Row Name 09/25/24 0800     Education   Cardiac Education Topics Pritikin   Secondary School Teacher School   Educator Nurse;Respiratory Therapist   Weekly Topic Tasty Appetizers and Snacks   Instruction Review Code 1- Verbalizes Understanding    Row Name  09/25/24 0900     Education   Cardiac Education Topics Pritikin   [employer] Program[employer]     Cooking School   Educator Nurse;Respiratory Therapist   Weekly Topic Tasty Appetizers and Snacks   Instruction Review Code 1- Verbalizes Understanding   Class Start Time 0815   Class Stop Time 0846   Class Time Calculation (min) 31 min    Row Name 09/30/24 0700     Education   Cardiac  Education Topics Pritikin   Nurse, Children's Exercise Physiologist   [facility] Psychosocial   Psychosocial Healthy Minds, Bodies, Hearts   Instruction Review Code 1- Verbalizes Understanding   Class Start Time 0815   Class Stop Time 0847   Class Time Calculation (min) 32 min    Row Name 10/07/24 0900     Education   Cardiac Education Topics Pritikin   [employer] Workshops     Workshops   Educator Exercise Physiologist   [employer] Exercise   Exercise Workshop Location Manager and Fall Prevention   Instruction Review Code 1- Verbalizes Understanding   Class Start Time 0815   Class Stop Time 0850   Class Time Calculation (min) 35 min      Core Videos: Exercise    Move It!  Clinical staff conducted group or individual video education with verbal and written material and guidebook.  Patient learns the recommended Pritikin exercise program. Exercise with the goal of living a long, healthy life. Some of the health benefits of exercise include controlled diabetes, healthier blood pressure levels, improved cholesterol levels, improved heart and lung capacity, improved sleep, and better body composition. Everyone should speak with their doctor before starting or changing an exercise routine.  Biomechanical Limitations Clinical staff conducted group or individual video education with verbal and written material and guidebook.  Patient learns how biomechanical limitations can impact exercise and how we can mitigate and possibly overcome limitations to have an impactful and balanced  exercise routine.  Body Composition Clinical staff conducted group or individual video education with verbal and written material and guidebook.  Patient learns that body composition (ratio of muscle mass to fat mass) is a key component to assessing overall fitness, rather than body weight alone. Increased fat mass, especially visceral belly fat, can put us  at increased risk for metabolic syndrome, type 2 diabetes, heart disease, and even death. It is recommended to combine diet and exercise (cardiovascular and resistance training) to improve your body composition. Seek guidance from your physician and exercise physiologist before implementing an exercise routine.  Exercise Action Plan Clinical staff conducted group or individual video education with verbal and written material and guidebook.  Patient learns the recommended strategies to achieve and enjoy long-term exercise adherence, including variety, self-motivation, self-efficacy, and positive decision making. Benefits of exercise include fitness, good health, weight management, more energy, better sleep, less stress, and overall well-being.  Medical   Heart Disease Risk Reduction Clinical staff conducted group or individual video education with verbal and written material and guidebook.  Patient learns our heart is our most vital organ as it circulates oxygen, nutrients, white blood cells, and hormones throughout the entire body, and carries waste away. Data supports a plant-based eating plan like the Pritikin Program for its effectiveness in slowing progression of and reversing heart disease. The video provides a number of recommendations to address heart disease.   Metabolic Syndrome and Belly Fat  Clinical staff conducted group or individual video education with verbal and written material and guidebook.  Patient learns what metabolic syndrome is, how it leads to heart disease, and how one can reverse it and keep it from coming back. You  have metabolic syndrome if you have 3 of the following 5 criteria: abdominal obesity, high blood pressure, high triglycerides, low HDL cholesterol, and high blood sugar.  Hypertension and Heart Disease Clinical staff conducted group or individual video education with verbal and written material and guidebook.  Patient learns that high blood pressure, or hypertension, is very common in the [place] . Hypertension is largely due to excessive salt intake, but other important risk factors include being overweight, physical inactivity, drinking too much alcohol, smoking, and not eating enough potassium from fruits and vegetables. High blood pressure is a leading risk factor for heart attack, stroke, congestive heart failure, dementia, kidney failure, and premature death. Long-term effects of excessive salt intake include stiffening of the arteries and thickening of heart muscle and organ damage. Recommendations include ways to reduce hypertension and the risk of heart disease.  Diseases of Our Time - Focusing on Diabetes Clinical staff conducted group or individual video education with verbal and written material and guidebook.  Patient learns why the best way to stop diseases of our time is prevention, through food and other lifestyle changes. Medicine (such as prescription pills and surgeries) is often only a Band-Aid on the problem, not a long-term solution. Most common diseases of our time include obesity, type 2 diabetes, hypertension, heart disease, and cancer. The Pritikin Program is recommended and has been proven to help reduce, reverse, and/or prevent the damaging effects of metabolic syndrome.  Nutrition   Overview of the Pritikin Eating Plan  Clinical staff conducted group or individual video education with verbal and written material and guidebook.  Patient learns about the Pritikin Eating Plan for disease risk reduction. The Pritikin Eating Plan emphasizes a wide variety of unrefined,  minimally-processed carbohydrates, like fruits, vegetables, whole grains, and legumes. Go, Caution, and Stop food choices are explained. Plant-based and lean animal proteins are emphasized. Rationale provided for low sodium intake for blood pressure control, low added sugars for blood sugar stabilization, and low added fats and oils for coronary artery disease risk reduction and weight management.  Calorie Density  Clinical staff conducted group or individual video education with verbal and written material and guidebook.  Patient learns about calorie density and how it impacts the Pritikin Eating Plan. Knowing the characteristics of the food you choose will help you decide whether those foods will lead to weight gain or weight loss, and whether you want to consume more or less of them. Weight loss is usually a side effect of the Pritikin Eating Plan because of its focus on low calorie-dense foods.  Label Reading  Clinical staff conducted group or individual video education with verbal and written material and guidebook.  Patient learns about the Pritikin recommended label reading guidelines and corresponding recommendations regarding calorie density, added sugars, sodium content, and whole grains.  Dining Out - Part 1  Clinical staff conducted group or individual video education with verbal and written material and guidebook.  Patient learns that restaurant meals can be sabotaging because they can be so high in calories, fat, sodium, and/or sugar. Patient learns recommended strategies on how to positively address this and avoid unhealthy pitfalls.  Facts on Fats  Clinical staff conducted group or individual video education with verbal and written material and guidebook.  Patient learns that lifestyle modifications can be just as effective, if not more so, as many medications for lowering your risk of heart disease. A Pritikin lifestyle can help to reduce your risk of inflammation and atherosclerosis  (cholesterol build-up, or plaque, in the artery walls). Lifestyle interventions such as dietary choices and physical activity address the cause of atherosclerosis. A review of the types of fats and their impact on blood cholesterol levels, along with dietary recommendations to reduce fat intake is also included.  Nutrition Action  Plan  Clinical staff conducted group or individual video education with verbal and written material and guidebook.  Patient learns how to incorporate Pritikin recommendations into their lifestyle. Recommendations include planning and keeping personal health goals in mind as an important part of their success.  Healthy Mind-Set    Healthy Minds, Bodies, Hearts  Clinical staff conducted group or individual video education with verbal and written material and guidebook.  Patient learns how to identify when they are stressed. Video will discuss the impact of that stress, as well as the many benefits of stress management. Patient will also be introduced to stress management techniques. The way we think, act, and feel has an impact on our hearts.  How Our Thoughts Can Heal Our Hearts  Clinical staff conducted group or individual video education with verbal and written material and guidebook.  Patient learns that negative thoughts can cause depression and anxiety. This can result in negative lifestyle behavior and serious health problems. Cognitive behavioral therapy is an effective method to help control our thoughts in order to change and improve our emotional outlook.  Additional Videos:  Exercise    Improving Performance  Clinical staff conducted group or individual video education with verbal and written material and guidebook.  Patient learns to use a non-linear approach by alternating intensity levels and lengths of time spent exercising to help burn more calories and lose more body fat. Cardiovascular exercise helps improve heart health, metabolism, hormonal balance,  blood sugar control, and recovery from fatigue. Resistance training improves strength, endurance, balance, coordination, reaction time, metabolism, and muscle mass. Flexibility exercise improves circulation, posture, and balance. Seek guidance from your physician and exercise physiologist before implementing an exercise routine and learn your capabilities and proper form for all exercise.  Introduction to Yoga  Clinical staff conducted group or individual video education with verbal and written material and guidebook.  Patient learns about yoga, a discipline of the coming together of mind, breath, and body. The benefits of yoga include improved flexibility, improved range of motion, better posture and core strength, increased lung function, weight loss, and positive self-image. Yoga's heart health benefits include lowered blood pressure, healthier heart rate, decreased cholesterol and triglyceride levels, improved immune function, and reduced stress. Seek guidance from your physician and exercise physiologist before implementing an exercise routine and learn your capabilities and proper form for all exercise.  Medical   Aging: Enhancing Your Quality of Life  Clinical staff conducted group or individual video education with verbal and written material and guidebook.  Patient learns key strategies and recommendations to stay in good physical health and enhance quality of life, such as prevention strategies, having an advocate, securing a Health Care Proxy and Power of Attorney, and keeping a list of medications and system for tracking them. It also discusses how to avoid risk for bone loss.  Biology of Weight Control  Clinical staff conducted group or individual video education with verbal and written material and guidebook.  Patient learns that weight gain occurs because we consume more calories than we burn (eating more, moving less). Even if your body weight is normal, you may have higher ratios of fat  compared to muscle mass. Too much body fat puts you at increased risk for cardiovascular disease, heart attack, stroke, type 2 diabetes, and obesity-related cancers. In addition to exercise, following the Pritikin Eating Plan can help reduce your risk.  Decoding Lab Results  Clinical staff conducted group or individual video education with verbal and written material and  guidebook.  Patient learns that lab test reflects one measurement whose values change over time and are influenced by many factors, including medication, stress, sleep, exercise, food, hydration, pre-existing medical conditions, and more. It is recommended to use the knowledge from this video to become more involved with your lab results and evaluate your numbers to speak with your doctor.   Diseases of Our Time - Overview  Clinical staff conducted group or individual video education with verbal and written material and guidebook.  Patient learns that according to the CDC, 50% to 70% of chronic diseases (such as obesity, type 2 diabetes, elevated lipids, hypertension, and heart disease) are avoidable through lifestyle improvements including healthier food choices, listening to satiety cues, and increased physical activity.  Sleep Disorders Clinical staff conducted group or individual video education with verbal and written material and guidebook.  Patient learns how good quality and duration of sleep are important to overall health and well-being. Patient also learns about sleep disorders and how they impact health along with recommendations to address them, including discussing with a physician.  Nutrition  Dining Out - Part 2 Clinical staff conducted group or individual video education with verbal and written material and guidebook.  Patient learns how to plan ahead and communicate in order to maximize their dining experience in a healthy and nutritious manner. Included are recommended food choices based on the type of restaurant  the patient is visiting.   Fueling a Banker conducted group or individual video education with verbal and written material and guidebook.  There is a strong connection between our food choices and our health. Diseases like obesity and type 2 diabetes are very prevalent and are in large-part due to lifestyle choices. The Pritikin Eating Plan provides plenty of food and hunger-curbing satisfaction. It is easy to follow, affordable, and helps reduce health risks.  Menu Workshop  Clinical staff conducted group or individual video education with verbal and written material and guidebook.  Patient learns that restaurant meals can sabotage health goals because they are often packed with calories, fat, sodium, and sugar. Recommendations include strategies to plan ahead and to communicate with the manager, chef, or server to help order a healthier meal.  Planning Your Eating Strategy  Clinical staff conducted group or individual video education with verbal and written material and guidebook.  Patient learns about the Pritikin Eating Plan and its benefit of reducing the risk of disease. The Pritikin Eating Plan does not focus on calories. Instead, it emphasizes high-quality, nutrient-rich foods. By knowing the characteristics of the foods, we choose, we can determine their calorie density and make informed decisions.  Targeting Your Nutrition Priorities  Clinical staff conducted group or individual video education with verbal and written material and guidebook.  Patient learns that lifestyle habits have a tremendous impact on disease risk and progression. This video provides eating and physical activity recommendations based on your personal health goals, such as reducing LDL cholesterol, losing weight, preventing or controlling type 2 diabetes, and reducing high blood pressure.  Vitamins and Minerals  Clinical staff conducted group or individual video education with verbal and written  material and guidebook.  Patient learns different ways to obtain key vitamins and minerals, including through a recommended healthy diet. It is important to discuss all supplements you take with your doctor.   Healthy Mind-Set    Smoking Cessation  Clinical staff conducted group or individual video education with verbal and written material and guidebook.  Patient learns that  cigarette smoking and tobacco addiction pose a serious health risk which affects millions of people. Stopping smoking will significantly reduce the risk of heart disease, lung disease, and many forms of cancer. Recommended strategies for quitting are covered, including working with your doctor to develop a successful plan.  Culinary   Becoming a Set Designer conducted group or individual video education with verbal and written material and guidebook.  Patient learns that cooking at home can be healthy, cost-effective, quick, and puts them in control. Keys to cooking healthy recipes will include looking at your recipe, assessing your equipment needs, planning ahead, making it simple, choosing cost-effective seasonal ingredients, and limiting the use of added fats, salts, and sugars.  Cooking - Breakfast and Snacks  Clinical staff conducted group or individual video education with verbal and written material and guidebook.  Patient learns how important breakfast is to satiety and nutrition through the entire day. Recommendations include key foods to eat during breakfast to help stabilize blood sugar levels and to prevent overeating at meals later in the day. Planning ahead is also a key component.  Cooking - Educational Psychologist conducted group or individual video education with verbal and written material and guidebook.  Patient learns eating strategies to improve overall health, including an approach to cook more at home. Recommendations include thinking of animal protein as a side on your plate  rather than center stage and focusing instead on lower calorie dense options like vegetables, fruits, whole grains, and plant-based proteins, such as beans. Making sauces in large quantities to freeze for later and leaving the skin on your vegetables are also recommended to maximize your experience.  Cooking - Healthy Salads and Dressing Clinical staff conducted group or individual video education with verbal and written material and guidebook.  Patient learns that vegetables, fruits, whole grains, and legumes are the foundations of the Pritikin Eating Plan. Recommendations include how to incorporate each of these in flavorful and healthy salads, and how to create homemade salad dressings. Proper handling of ingredients is also covered. Cooking - Soups and [employer] - Soups and Desserts Clinical staff conducted group or individual video education with verbal and written material and guidebook.  Patient learns that Pritikin soups and desserts make for easy, nutritious, and delicious snacks and meal components that are low in sodium, fat, sugar, and calorie density, while high in vitamins, minerals, and filling fiber. Recommendations include simple and healthy ideas for soups and desserts.   Overview     The Pritikin Solution Program Overview Clinical staff conducted group or individual video education with verbal and written material and guidebook.  Patient learns that the results of the Pritikin Program have been documented in more than 100 articles published in peer-reviewed journals, and the benefits include reducing risk factors for (and, in some cases, even reversing) high cholesterol, high blood pressure, type 2 diabetes, obesity, and more! An overview of the three key pillars of the Pritikin Program will be covered: eating well, doing regular exercise, and having a healthy mind-set.  WORKSHOPS  Exercise: Exercise Basics: Building Your Action Plan Clinical staff led group instruction  and group discussion with PowerPoint presentation and patient guidebook. To enhance the learning environment the use of posters, models and videos may be added. At the conclusion of this workshop, patients will comprehend the difference between physical activity and exercise, as well as the benefits of incorporating both, into their routine. Patients will understand the FITT (Frequency, Intensity,  Time, and Type) principle and how to use it to build an exercise action plan. In addition, safety concerns and other considerations for exercise and cardiac rehab will be addressed by the presenter. The purpose of this lesson is to promote a comprehensive and effective weekly exercise routine in order to improve patients' overall level of fitness.   Managing Heart Disease: Your Path to a Healthier Heart Clinical staff led group instruction and group discussion with PowerPoint presentation and patient guidebook. To enhance the learning environment the use of posters, models and videos may be added.At the conclusion of this workshop, patients will understand the anatomy and physiology of the heart. Additionally, they will understand how Pritikin's three pillars impact the risk factors, the progression, and the management of heart disease.  The purpose of this lesson is to provide a high-level overview of the heart, heart disease, and how the Pritikin lifestyle positively impacts risk factors.  Exercise Biomechanics Clinical staff led group instruction and group discussion with PowerPoint presentation and patient guidebook. To enhance the learning environment the use of posters, models and videos may be added. Patients will learn how the structural parts of their bodies function and how these functions impact their daily activities, movement, and exercise. Patients will learn how to promote a neutral spine, learn how to manage pain, and identify ways to improve their physical movement in order to  promote healthy living. The purpose of this lesson is to expose patients to common physical limitations that impact physical activity. Participants will learn practical ways to adapt and manage aches and pains, and to minimize their effect on regular exercise. Patients will learn how to maintain good posture while sitting, walking, and lifting.  Balance Training and Fall Prevention  Clinical staff led group instruction and group discussion with PowerPoint presentation and patient guidebook. To enhance the learning environment the use of posters, models and videos may be added. At the conclusion of this workshop, patients will understand the importance of their sensorimotor skills (vision, proprioception, and the vestibular system) in maintaining their ability to balance as they age. Patients will apply a variety of balancing exercises that are appropriate for their current level of function. Patients will understand the common causes for poor balance, possible solutions to these problems, and ways to modify their physical environment in order to minimize their fall risk. The purpose of this lesson is to teach patients about the importance of maintaining balance as they age and ways to minimize their risk of falling.  WORKSHOPS   Nutrition:  Fueling a Ship Broker led group instruction and group discussion with PowerPoint presentation and patient guidebook. To enhance the learning environment the use of posters, models and videos may be added. Patients will review the foundational principles of the Pritikin Eating Plan and understand what constitutes a serving size in each of the food groups. Patients will also learn Pritikin-friendly foods that are better choices when away from home and review make-ahead meal and snack options. Calorie density will be reviewed and applied to three nutrition priorities: weight maintenance, weight loss, and weight gain. The purpose of this lesson is to  reinforce (in a group setting) the key concepts around what patients are recommended to eat and how to apply these guidelines when away from home by planning and selecting Pritikin-friendly options. Patients will understand how calorie density may be adjusted for different weight management goals.  Mindful Eating  Clinical staff led group instruction and group discussion with PowerPoint presentation and patient  guidebook. To enhance the learning environment the use of posters, models and videos may be added. Patients will briefly review the concepts of the Pritikin Eating Plan and the importance of low-calorie dense foods. The concept of mindful eating will be introduced as well as the importance of paying attention to internal hunger signals. Triggers for non-hunger eating and techniques for dealing with triggers will be explored. The purpose of this lesson is to provide patients with the opportunity to review the basic principles of the Pritikin Eating Plan, discuss the value of eating mindfully and how to measure internal cues of hunger and fullness using the Hunger Scale. Patients will also discuss reasons for non-hunger eating and learn strategies to use for controlling emotional eating.  Targeting Your Nutrition Priorities Clinical staff led group instruction and group discussion with PowerPoint presentation and patient guidebook. To enhance the learning environment the use of posters, models and videos may be added. Patients will learn how to determine their genetic susceptibility to disease by reviewing their family history. Patients will gain insight into the importance of diet as part of an overall healthy lifestyle in mitigating the impact of genetics and other environmental insults. The purpose of this lesson is to provide patients with the opportunity to assess their personal nutrition priorities by looking at their family history, their own health history and current risk factors. Patients will  also be able to discuss ways of prioritizing and modifying the Pritikin Eating Plan for their highest risk areas  Menu  Clinical staff led group instruction and group discussion with PowerPoint presentation and patient guidebook. To enhance the learning environment the use of posters, models and videos may be added. Using menus brought in from [employer], or printed from [employer], patients will apply the Pritikin dining out guidelines that were presented in the [employer] video. Patients will also be able to practice these guidelines in a variety of provided scenarios. The purpose of this lesson is to provide patients with the opportunity to practice hands-on learning of the Pritikin Dining Out guidelines with actual menus and practice scenarios.  Label Reading Clinical staff led group instruction and group discussion with PowerPoint presentation and patient guidebook. To enhance the learning environment the use of posters, models and videos may be added. Patients will review and discuss the Pritikin label reading guidelines presented in [employer]'s Label Reading Educational series video. Using fool labels brought in from local grocery stores and markets, patients will apply the label reading guidelines and determine if the packaged food meet the Pritikin guidelines. The purpose of this lesson is to provide patients with the opportunity to review, discuss, and practice hands-on learning of the Pritikin Label Reading guidelines with actual packaged food labels. Cooking School  Pritikin's [employer] are designed to teach patients ways to prepare quick, simple, and affordable recipes at home. The importance of nutrition's role in chronic disease risk reduction is reflected in its emphasis in the overall Pritikin program. By learning how to prepare essential core Pritikin Eating Plan recipes, patients will increase control over what they eat; be able to customize the  flavor of foods without the use of added salt, sugar, or fat; and improve the quality of the food they consume. By learning a set of core recipes which are easily assembled, quickly prepared, and affordable, patients are more likely to prepare more healthy foods at home. These workshops focus on convenient breakfasts, simple entres, side dishes, and desserts which can be prepared with  minimal effort and are consistent with nutrition recommendations for cardiovascular risk reduction. Cooking [employer] are taught by a armed forces logistics/support/administrative officer (RD) who has been trained by the [employer]. The chef or RD has a clear understanding of the importance of minimizing - if not completely eliminating - added fat, sugar, and sodium in recipes. Throughout the series of Cooking School Workshop sessions, patients will learn about healthy ingredients and efficient methods of cooking to build confidence in their capability to prepare    Cooking School weekly topics:  Adding Flavor- Sodium-Free  Fast and Healthy Breakfasts  Powerhouse Plant-Based Proteins  Satisfying Salads and Dressings  Simple Sides and Sauces  International Cuisine-Spotlight on the [employer] Zones  Delicious Desserts  Savory Soups  [employer] - Meals in a Astronomer Appetizers and Snacks  Comforting Weekend Breakfasts  One-Pot Wonders   Fast Evening Meals  Landscape Architect Your Pritikin Plate  WORKSHOPS   Healthy Mindset (Psychosocial):  Focused Goals, Sustainable Changes Clinical staff led group instruction and group discussion with PowerPoint presentation and patient guidebook. To enhance the learning environment the use of posters, models and videos may be added. Patients will be able to apply effective goal setting strategies to establish at least one personal goal, and then take consistent, meaningful action toward that goal. They will learn to identify common barriers to achieving  personal goals and develop strategies to overcome them. Patients will also gain an understanding of how our mind-set can impact our ability to achieve goals and the importance of cultivating a positive and growth-oriented mind-set. The purpose of this lesson is to provide patients with a deeper understanding of how to set and achieve personal goals, as well as the tools and strategies needed to overcome common obstacles which may arise along the way.  From Head to Heart: The Power of a Healthy Outlook  Clinical staff led group instruction and group discussion with PowerPoint presentation and patient guidebook. To enhance the learning environment the use of posters, models and videos may be added. Patients will be able to recognize and describe the impact of emotions and mood on physical health. They will discover the importance of self-care and explore self-care practices which may work for them. Patients will also learn how to utilize the 4 C's to cultivate a healthier outlook and better manage stress and challenges. The purpose of this lesson is to demonstrate to patients how a healthy outlook is an essential part of maintaining good health, especially as they continue their cardiac rehab journey.  Healthy Sleep for a Healthy Heart Clinical staff led group instruction and group discussion with PowerPoint presentation and patient guidebook. To enhance the learning environment the use of posters, models and videos may be added. At the conclusion of this workshop, patients will be able to demonstrate knowledge of the importance of sleep to overall health, well-being, and quality of life. They will understand the symptoms of, and treatments for, common sleep disorders. Patients will also be able to identify daytime and nighttime behaviors which impact sleep, and they will be able to apply these tools to help manage sleep-related challenges. The purpose of this lesson is to provide patients with a general  overview of sleep and outline the importance of quality sleep. Patients will learn about a few of the most common sleep disorders. Patients will also be introduced to the concept of "sleep hygiene," and discover ways to self-manage certain sleeping problems through simple daily behavior changes.  Finally, the workshop will motivate patients by clarifying the links between quality sleep and their goals of heart-healthy living.   Recognizing and Reducing Stress Clinical staff led group instruction and group discussion with PowerPoint presentation and patient guidebook. To enhance the learning environment the use of posters, models and videos may be added. At the conclusion of this workshop, patients will be able to understand the types of stress reactions, differentiate between acute and chronic stress, and recognize the impact that chronic stress has on their health. They will also be able to apply different coping mechanisms, such as reframing negative self-talk. Patients will have the opportunity to practice a variety of stress management techniques, such as deep abdominal breathing, progressive muscle relaxation, and/or guided imagery.  The purpose of this lesson is to educate patients on the role of stress in their lives and to provide healthy techniques for coping with it.  Learning Barriers/Preferences:  Learning Barriers/Preferences - 08/22/24 1144       Learning Barriers/Preferences   Learning Barriers Sight    Learning Preferences Audio;Skilled Demonstration;Computer/Internet;Verbal Instruction;Group Instruction;Written Material;Video;Individual Instruction;Pictoral          Education Topics:  Knowledge Questionnaire Score:  Knowledge Questionnaire Score - 08/22/24 1144       Knowledge Questionnaire Score   Pre Score 22/24          Core Components/Risk Factors/Patient Goals at Admission:  Personal Goals and Risk Factors at Admission - 08/22/24 1145       Core Components/Risk  Factors/Patient Goals on Admission    Weight Management Yes;Obesity;Weight Loss    Intervention Weight Management: Develop a combined nutrition and exercise program designed to reach desired caloric intake, while maintaining appropriate intake of nutrient and fiber, sodium and fats, and appropriate energy expenditure required for the weight goal.;Weight Management: Provide education and appropriate resources to help participant work on and attain dietary goals.;Weight Management/Obesity: Establish reasonable short term and long term weight goals.;Obesity: Provide education and appropriate resources to help participant work on and attain dietary goals.    Admit Weight 289 lb 7.4 oz (131.3 kg)    Expected Outcomes Short Term: Continue to assess and modify interventions until short term weight is achieved;Long Term: Adherence to nutrition and physical activity/exercise program aimed toward attainment of established weight goal;Weight Loss: Understanding of general recommendations for a balanced deficit meal plan, which promotes 1-2 lb weight loss per week and includes a negative energy balance of [phone] kcal/d;Understanding recommendations for meals to include 15-35% energy as protein, 25-35% energy from fat, 35-60% energy from carbohydrates, less than 200mg  of dietary cholesterol, 20-35 gm of total fiber daily;Understanding of distribution of calorie intake throughout the day with the consumption of 4-5 meals/snacks    Diabetes Yes    Intervention Provide education about signs/symptoms and action to take for hypo/hyperglycemia.;Provide education about proper nutrition, including hydration, and aerobic/resistive exercise prescription along with prescribed medications to achieve blood glucose in normal ranges: Fasting glucose 65-99 mg/dL    Expected Outcomes Short Term: Participant verbalizes understanding of the signs/symptoms and immediate care of hyper/hypoglycemia, proper foot care and importance of  medication, aerobic/resistive exercise and nutrition plan for blood glucose control.;Long Term: Attainment of HbA1C < 7%.    Heart Failure Yes    Intervention Provide a combined exercise and nutrition program that is supplemented with education, support and counseling about heart failure. Directed toward relieving symptoms such as shortness of breath, decreased exercise tolerance, and extremity edema.    Expected Outcomes Improve functional capacity of  life;Short term: Attendance in program 2-3 days a week with increased exercise capacity. Reported lower sodium intake. Reported increased fruit and vegetable intake. Reports medication compliance.;Short term: Daily weights obtained and reported for increase. Utilizing diuretic protocols set by physician.;Long term: Adoption of self-care skills and reduction of barriers for early signs and symptoms recognition and intervention leading to self-care maintenance.    Hypertension Yes    Intervention Provide education on lifestyle modifcations including regular physical activity/exercise, weight management, moderate sodium restriction and increased consumption of fresh fruit, vegetables, and low fat dairy, alcohol moderation, and smoking cessation.;Monitor prescription use compliance.    Expected Outcomes Short Term: Continued assessment and intervention until BP is < 140/35mm HG in hypertensive participants. < 130/64mm HG in hypertensive participants with diabetes, heart failure or chronic kidney disease.;Long Term: Maintenance of blood pressure at goal levels.    Lipids Yes    Intervention Provide education and support for participant on nutrition & aerobic/resistive exercise along with prescribed medications to achieve LDL 70mg , HDL >40mg .    Expected Outcomes Short Term: Participant states understanding of desired cholesterol values and is compliant with medications prescribed. Participant is following exercise prescription and nutrition guidelines.;Long Term:  Cholesterol controlled with medications as prescribed, with individualized exercise RX and with personalized nutrition plan. Value goals: LDL < 70mg , HDL > 40 mg.    Stress Yes    Intervention Offer individual and/or small group education and counseling on adjustment to heart disease, stress management and health-related lifestyle change. Teach and support self-help strategies.;Refer participants experiencing significant psychosocial distress to appropriate mental health specialists for further evaluation and treatment. When possible, include family members and significant others in education/counseling sessions.    Expected Outcomes Short Term: Participant demonstrates changes in health-related behavior, relaxation and other stress management skills, ability to obtain effective social support, and compliance with psychotropic medications if prescribed.;Long Term: Emotional wellbeing is indicated by absence of clinically significant psychosocial distress or social isolation.          Core Components/Risk Factors/Patient Goals Review:   Goals and Risk Factor Review     Row Name 08/26/24 1637 09/17/24 1712 10/10/24 1503         Core Components/Risk Factors/Patient Goals Review   Personal Goals Review Weight Management/Obesity;Heart Failure;Hypertension;Lipids;Diabetes;Stress Weight Management/Obesity;Heart Failure;Hypertension;Lipids;Diabetes;Stress Weight Management/Obesity;Heart Failure;Hypertension;Lipids;Diabetes;Stress     Review Wilfrido started cardiac rehab on 08/26/24. Mouhamad did well with exercise. Vital signs and CBG were stable. Darnel started cardiac rehab on 08/26/24. Shayne is off to a good start  with exercise. Vital signs and CBG have been stable. Niguel is doing well with exercise at cardiac rehab.. Vital signs and CBG have been stable. Roemello has increased his met levels. Weight is unchanged.     Expected Outcomes Danyael will continue to participate in cardiac rehab for exercise nutrition  and lifestyle modifications. Lakai will continue to participate in cardiac rehab for exercise nutrition and lifestyle modifications. Kyrese will continue to participate in cardiac rehab for exercise nutrition and lifestyle modifications.        Core Components/Risk Factors/Patient Goals at Discharge (Final Review):   Goals and Risk Factor Review - 10/10/24 1503       Core Components/Risk Factors/Patient Goals Review   Personal Goals Review Weight Management/Obesity;Heart Failure;Hypertension;Lipids;Diabetes;Stress    Review Ramiel is doing well with exercise at cardiac rehab.. Vital signs and CBG have been stable. Anas has increased his met levels. Weight is unchanged.    Expected Outcomes Coreyon will continue to participate in cardiac rehab  for exercise nutrition and lifestyle modifications.          ITP Comments:  ITP Comments     Row Name 08/22/24 0751 08/26/24 1626 09/17/24 1702 10/10/24 1459     ITP Comments Wilbert Bihari, MD: Medical Director. Intorduction to the [facility] / Intensive Cardiac Rehab. Initial orientation packet reviewed with the patient. 30 Day ITP Review. Adelfo started cardiac rehab on 08/26/24. Tyheim did well with exercise. 30 Day ITP Review. Violet has good attendance and participaiton cardiac with exercise at cardiac rehab. 30 Day ITP Review. Keyaan has good  participaiton with exercise at cardiac rehab when in attendance.       Comments: See ITP comments.[name] RN BSN

## 2024-10-14 ENCOUNTER — Encounter (HOSPITAL_COMMUNITY)
Admission: RE | Admit: 2024-10-14 | Discharge: 2024-10-14 | Disposition: A | Discharge status: Home / Self Care | Source: Ambulatory Visit | Attending: Cardiology | Admitting: Cardiology

## 2024-10-14 DIAGNOSIS — Z9861 Coronary angioplasty status: Secondary | ICD-10-CM | POA: Insufficient documentation

## 2024-10-14 DIAGNOSIS — I2102 ST elevation (STEMI) myocardial infarction involving left anterior descending coronary artery: Secondary | ICD-10-CM | POA: Diagnosis present

## 2024-10-16 ENCOUNTER — Encounter (HOSPITAL_COMMUNITY)
Admission: RE | Admit: 2024-10-16 | Discharge: 2024-10-16 | Disposition: A | Discharge status: Home / Self Care | Source: Ambulatory Visit | Attending: Cardiology | Admitting: Cardiology

## 2024-10-16 VITALS — Ht 69.0 in | Wt 291.4 lb

## 2024-10-16 DIAGNOSIS — I2102 ST elevation (STEMI) myocardial infarction involving left anterior descending coronary artery: Secondary | ICD-10-CM | POA: Diagnosis not present

## 2024-10-16 DIAGNOSIS — Z9861 Coronary angioplasty status: Secondary | ICD-10-CM

## 2024-10-20 ENCOUNTER — Encounter (HOSPITAL_COMMUNITY): Payer: Self-pay

## 2024-10-21 ENCOUNTER — Encounter (HOSPITAL_COMMUNITY)
Admission: RE | Admit: 2024-10-21 | Discharge: 2024-10-21 | Disposition: A | Discharge status: Home / Self Care | Source: Ambulatory Visit | Attending: Cardiology | Admitting: Cardiology

## 2024-10-21 DIAGNOSIS — I2102 ST elevation (STEMI) myocardial infarction involving left anterior descending coronary artery: Secondary | ICD-10-CM

## 2024-10-21 DIAGNOSIS — Z9861 Coronary angioplasty status: Secondary | ICD-10-CM

## 2024-10-21 NOTE — Progress Notes (Signed)
 Discharge Progress Report  Patient Details  Name: Andrew Brock MRN: [id number] Date of Birth: October 01, 1961 Referring Provider:   Flowsheet Row CARDIAC REHAB PHASE II ORIENTATION from 08/22/2024 in [facility]  Referring Provider Morene Brownie, MD     Number of Visits: 31  Reason for Discharge:  Patient reached a stable level of exercise. Patient independent in their exercise. Patient has met program and personal goals.  Smoking History:  Social History   Tobacco Use  Smoking Status Never  Smokeless Tobacco Never    Diagnosis:  06/28/24 ST elevation myocardial infarction involving left anterior descending (LAD) coronary artery (HCC)  06/28/24 S/P PTCA (percutaneous transluminal coronary angioplasty)  ADL UCSD:   Initial Exercise Prescription:  Initial Exercise Prescription - 08/22/24 0800       Date of Initial Exercise RX and Referring Provider   Date 08/22/24    Referring Provider Morene Brownie, MD    Expected Discharge Date 10/21/24      NuStep   Level 1    SPM 75    Minutes 15    METs 2.4      Arm Ergometer   Level 1    Watts 25    RPM 60    Minutes 15    METs 2.4      Prescription Details   Frequency (times per week) 2    Duration Progress to 30 minutes of continuous aerobic without signs/symptoms of physical distress      Intensity   THRR 40-80% of Max Heartrate 64-128    Ratings of Perceived Exertion 11-13    Perceived Dyspnea 0-4      Progression   Progression Continue progressive overload as per policy without signs/symptoms or physical distress.      Resistance Training   Training Prescription Yes    Weight 4lbs    Reps 10-15          Discharge Exercise Prescription (Final Exercise Prescription Changes):  Exercise Prescription Changes - 10/21/24 1631       Response to Exercise   Blood Pressure (Admit) 96/64    Blood Pressure (Exit) 98/64    Heart Rate (Admit) 69 bpm     Heart Rate (Exercise) 105 bpm    Heart Rate (Exit) 78 bpm    Rating of Perceived Exertion (Exercise) 11    Symptoms None    Comments Pt graduated from the CRP2 program    Duration Continue with 30 min of aerobic exercise without signs/symptoms of physical distress.    Intensity THRR unchanged      Progression   Progression Continue to progress workloads to maintain intensity without signs/symptoms of physical distress.    Average METs 2.95      Resistance Training   Training Prescription Yes    Weight 4 lbs    Reps 10-15    Time 5 Minutes      Interval Training   Interval Training No      NuStep   Level 5    SPM 109    Minutes 15    METs 3.4      Arm Ergometer   Level 4    Watts 35    RPM 56    Minutes 15    METs 2.5      Home Exercise Plan   Plans to continue exercise at Home (comment)    Frequency Add 2 additional days to program exercise sessions.  Initial Home Exercises Provided 09/04/24          Functional Capacity:  6 Minute Walk     Row Name 08/22/24 0939 10/16/24 0917       6 Minute Walk   Phase Initial Discharge    Distance 1253 feet 1629 feet    Distance % Change -- 30 %    Distance Feet Change -- 376 ft    Walk Time 6 minutes 6 minutes    # of Rest Breaks 0 0    MPH 2.4 3.1    METS 2.43 3.1    RPE 6 7    Perceived Dyspnea  0 0    VO2 Peak 8.5 10.81    Symptoms Yes (comment) No    Comments 5/10 chronic bilateral knee pain --    Resting HR 93 bpm 73 bpm    Resting BP 112/60 108/70    Resting Oxygen Saturation  98 % --    Exercise Oxygen Saturation  during 6 min walk 97 % --    Max Ex. HR 107 bpm 108 bpm    Max Ex. BP 118/70 120/70    2 Minute Post BP 102/70 --       Psychological, QOL, Others - Outcomes: PHQ 2/9:    10/21/2024   10:04 AM 08/22/2024   11:49 AM  Depression screen PHQ 2/9  Decreased Interest 0 0  Down, Depressed, Hopeless 1 2  PHQ - 2 Score 1 2  Altered sleeping 2 1  Tired, decreased energy 0 0  Change in  appetite 1 2  Feeling bad or failure about yourself  0 0  Trouble concentrating 0 0  Moving slowly or fidgety/restless 0 0  Suicidal thoughts 0 0  PHQ-9 Score 4 5   Difficult doing work/chores Not difficult at all Not difficult at all     Data saved with a previous flowsheet row definition    Quality of Life:  Quality of Life - 10/21/24 1626       Quality of Life Scores   Health/Function Pre 24.6 %    Health/Function Post 21.4 %    Health/Function % Change -13.01 %    Socioeconomic Pre 27.75 %    Socioeconomic Post 26.29 %    Socioeconomic % Change  -5.26 %    Psych/Spiritual Pre 25.57 %    Psych/Spiritual Post 24.57 %    Psych/Spiritual % Change -3.91 %    Family Pre 30 %    Family Post 26.7 %    Family % Change -11 %    GLOBAL Pre 26.08 %    GLOBAL Post 23.84 %    GLOBAL % Change -8.59 %          Personal Goals: Goals established at orientation with interventions provided to work toward goal.  Personal Goals and Risk Factors at Admission - 08/22/24 1145       Core Components/Risk Factors/Patient Goals on Admission    Weight Management Yes;Obesity;Weight Loss    Intervention Weight Management: Develop a combined nutrition and exercise program designed to reach desired caloric intake, while maintaining appropriate intake of nutrient and fiber, sodium and fats, and appropriate energy expenditure required for the weight goal.;Weight Management: Provide education and appropriate resources to help participant work on and attain dietary goals.;Weight Management/Obesity: Establish reasonable short term and long term weight goals.;Obesity: Provide education and appropriate resources to help participant work on and attain dietary goals.    Admit Weight 289 lb 7.4  oz (131.3 kg)    Expected Outcomes Short Term: Continue to assess and modify interventions until short term weight is achieved;Long Term: Adherence to nutrition and physical activity/exercise program aimed toward  attainment of established weight goal;Weight Loss: Understanding of general recommendations for a balanced deficit meal plan, which promotes 1-2 lb weight loss per week and includes a negative energy balance of [id number] kcal/d;Understanding recommendations for meals to include 15-35% energy as protein, 25-35% energy from fat, 35-60% energy from carbohydrates, less than 200mg  of dietary cholesterol, 20-35 gm of total fiber daily;Understanding of distribution of calorie intake throughout the day with the consumption of 4-5 meals/snacks    Diabetes Yes    Intervention Provide education about signs/symptoms and action to take for hypo/hyperglycemia.;Provide education about proper nutrition, including hydration, and aerobic/resistive exercise prescription along with prescribed medications to achieve blood glucose in normal ranges: Fasting glucose 65-99 mg/dL    Expected Outcomes Short Term: Participant verbalizes understanding of the signs/symptoms and immediate care of hyper/hypoglycemia, proper foot care and importance of medication, aerobic/resistive exercise and nutrition plan for blood glucose control.;Long Term: Attainment of HbA1C < 7%.    Heart Failure Yes    Intervention Provide a combined exercise and nutrition program that is supplemented with education, support and counseling about heart failure. Directed toward relieving symptoms such as shortness of breath, decreased exercise tolerance, and extremity edema.    Expected Outcomes Improve functional capacity of life;Short term: Attendance in program 2-3 days a week with increased exercise capacity. Reported lower sodium intake. Reported increased fruit and vegetable intake. Reports medication compliance.;Short term: Daily weights obtained and reported for increase. Utilizing diuretic protocols set by physician.;Long term: Adoption of self-care skills and reduction of barriers for early signs and symptoms recognition and intervention leading to self-care  maintenance.    Hypertension Yes    Intervention Provide education on lifestyle modifcations including regular physical activity/exercise, weight management, moderate sodium restriction and increased consumption of fresh fruit, vegetables, and low fat dairy, alcohol moderation, and smoking cessation.;Monitor prescription use compliance.    Expected Outcomes Short Term: Continued assessment and intervention until BP is < 140/33mm HG in hypertensive participants. < 130/30mm HG in hypertensive participants with diabetes, heart failure or chronic kidney disease.;Long Term: Maintenance of blood pressure at goal levels.    Lipids Yes    Intervention Provide education and support for participant on nutrition & aerobic/resistive exercise along with prescribed medications to achieve LDL 70mg , HDL >40mg .    Expected Outcomes Short Term: Participant states understanding of desired cholesterol values and is compliant with medications prescribed. Participant is following exercise prescription and nutrition guidelines.;Long Term: Cholesterol controlled with medications as prescribed, with individualized exercise RX and with personalized nutrition plan. Value goals: LDL < 70mg , HDL > 40 mg.    Stress Yes    Intervention Offer individual and/or small group education and counseling on adjustment to heart disease, stress management and health-related lifestyle change. Teach and support self-help strategies.;Refer participants experiencing significant psychosocial distress to appropriate mental health specialists for further evaluation and treatment. When possible, include family members and significant others in education/counseling sessions.    Expected Outcomes Short Term: Participant demonstrates changes in health-related behavior, relaxation and other stress management skills, ability to obtain effective social support, and compliance with psychotropic medications if prescribed.;Long Term: Emotional wellbeing is  indicated by absence of clinically significant psychosocial distress or social isolation.           Personal Goals Discharge:  Goals and Risk Factor Review  Row Name 08/26/24 1637 09/17/24 1712 10/10/24 1503         Core Components/Risk Factors/Patient Goals Review   Personal Goals Review Weight Management/Obesity;Heart Failure;Hypertension;Lipids;Diabetes;Stress Weight Management/Obesity;Heart Failure;Hypertension;Lipids;Diabetes;Stress Weight Management/Obesity;Heart Failure;Hypertension;Lipids;Diabetes;Stress     Review Andrew Brock started cardiac rehab on 08/26/24. Andrew Brock did well with exercise. Vital signs and CBG were stable. Andrew Brock started cardiac rehab on 08/26/24. Andrew Brock is off to a good start  with exercise. Vital signs and CBG have been stable. Andrew Brock is doing well with exercise at cardiac rehab.. Vital signs and CBG have been stable. Andrew Brock has increased his met levels. Weight is unchanged.     Expected Outcomes Andrew Brock will continue to participate in cardiac rehab for exercise nutrition and lifestyle modifications. Andrew Brock will continue to participate in cardiac rehab for exercise nutrition and lifestyle modifications. Andrew Brock will continue to participate in cardiac rehab for exercise nutrition and lifestyle modifications.        Exercise Goals and Review:  Exercise Goals     Row Name 08/22/24 0751             Exercise Goals   Increase Physical Activity Yes       Intervention Provide advice, education, support and counseling about physical activity/exercise needs.;Develop an individualized exercise prescription for aerobic and resistive training based on initial evaluation findings, risk stratification, comorbidities and participant's personal goals.       Expected Outcomes Short Term: Attend rehab on a regular basis to increase amount of physical activity.;Long Term: Add in home exercise to make exercise part of routine and to increase amount of physical activity.;Long Term:  Exercising regularly at least 3-5 days a week.       Increase Strength and Stamina Yes       Intervention Provide advice, education, support and counseling about physical activity/exercise needs.;Develop an individualized exercise prescription for aerobic and resistive training based on initial evaluation findings, risk stratification, comorbidities and participant's personal goals.       Expected Outcomes Short Term: Increase workloads from initial exercise prescription for resistance, speed, and METs.;Short Term: Perform resistance training exercises routinely during rehab and add in resistance training at home;Long Term: Improve cardiorespiratory fitness, muscular endurance and strength as measured by increased METs and functional capacity ( )       Able to understand and use rate of perceived exertion (RPE) scale Yes       Intervention Provide education and explanation on how to use RPE scale       Expected Outcomes Short Term: Able to use RPE daily in rehab to express subjective intensity level;Long Term:  Able to use RPE to guide intensity level when exercising independently       Knowledge and understanding of Target Heart Rate Range (THRR) Yes       Intervention Provide education and explanation of THRR including how the numbers were predicted and where they are located for reference       Expected Outcomes Short Term: Able to state/look up THRR;Short Term: Able to use daily as guideline for intensity in rehab;Long Term: Able to use THRR to govern intensity when exercising independently       Understanding of Exercise Prescription Yes       Intervention Provide education, explanation, and written materials on patient's individual exercise prescription       Expected Outcomes Short Term: Able to explain program exercise prescription;Long Term: Able to explain home exercise prescription to exercise independently          Exercise Goals  Re-Evaluation:  Exercise Goals Re-Evaluation     Row  Name 08/26/24 1224 09/23/24 1243 10/21/24 1634         Exercise Goal Re-Evaluation   Exercise Goals Review Increase Physical Activity;Increase Strength and Stamina;Able to understand and use rate of perceived exertion (RPE) scale;Knowledge and understanding of Target Heart Rate Range (THRR);Understanding of Exercise Prescription Increase Physical Activity;Increase Strength and Stamina;Able to understand and use rate of perceived exertion (RPE) scale;Knowledge and understanding of Target Heart Rate Range (THRR);Understanding of Exercise Prescription Increase Physical Activity;Increase Strength and Stamina;Able to understand and use rate of perceived exertion (RPE) scale;Knowledge and understanding of Target Heart Rate Range (THRR);Understanding of Exercise Prescription     Comments Pt's first day in the CRP2 program. Pt understnads the exercise Rx, RPE scale and THRR. Reviewed METs and goals. Pt voices progress on his goals of imrpoved strength and stamina. Pt is exercising at home 1-2x/week in the community gym. Pt also has goal of weight loss but has not made progress on this to date. Continue to encourage patient to make healthy dietary items. Pt graduated form the CRP2 program today. Pt has made good progress on his goals of improved strength and stamina. Pt is exercising at home 1-2x/week in the community gym. Pt also has goal of weight loss, but did not lose any weight during the program. Andrew Brock is at goal of 150 minutes of exercise per week.     Expected Outcomes Will continue to montior patient and progress exericse workloads as tolerated. Will continue to montior patient and progress exericse workloads as tolerated. Pt will continue to exercise at his comminuty gym and pt will also go tot he boxing gym to punch the bag.        Nutrition & Weight - Outcomes:  Pre Biometrics - 08/22/24 0800       Pre Biometrics   Waist Circumference 55 inches    Hip Circumference 51 inches    Waist to Hip Ratio  1.08 %    Triceps Skinfold 11 mm    % Body Fat 38.7 %    Grip Strength 40 kg    Flexibility 9 in    Single Leg Stand 8.68 seconds          Post Biometrics - 10/16/24 0930        Post  Biometrics   Height 5' 9 (1.753 m)    Weight 132.2 kg    Waist Circumference 54 inches    Hip Circumference 52 inches    Waist to Hip Ratio 1.04 %    BMI (Calculated) 43.02    Triceps Skinfold 11 mm    % Body Fat 38.3 %    Grip Strength 42 kg    Flexibility 9 in    Single Leg Stand 15.43 seconds          Nutrition:   Nutrition Discharge:   Education Questionnaire Score:  Knowledge Questionnaire Score - 10/21/24 1622       Knowledge Questionnaire Score   Post Score 24/24         Pt graduates from  Intensive/Traditional cardiac rehab program today with completion of 31 exercise and education sessions. Pt maintained good attendance and progressed nicely during their participation in rehab as evidenced by increased MET level.   Medication list reconciled. Repeat  PHQ score-4 and pt has requested counselor referral for his mental health concerns.  Pt has made significant lifestyle changes and should be commended for his success.  Andrew Brock achieved  his goals during cardiac rehab.   Pt plans to continue exercise at the fitness center at his apartment complex 4-5x week.  Goals reviewed with patient; copy given to patient.

## 2024-10-21 NOTE — Progress Notes (Addendum)
 Pt requests seeing a counselor related to having a hard time adjusting with anxiety/stress caused by his cardiac event and subsequent physical limitations.  Denies SI/HI and understands seeing a counselor is a separate charge from Cardiac Rehab.  Repeat PHQ-9 score is 4.

## 2024-10-21 NOTE — Progress Notes (Signed)
  PSYCHOSOCIAL ASSESSMENT   Patient psychosocial assessment reveals patient shows poor coping skills with ambivalent outlook.  Psychosocial barriers of anxiety and stress identified. Offered emotional support and reassurance. Agreeable for ambulatory referral to [facility] and verbalizes understanding that this is a separate cost from Cardiac/Pulmonary rehab and there may be a financial obligation depending upon patient's insurance plan coverage.  PHQ9 -  Quality of life Score : Overall 26.08 Health and Function 24.6 Socioeconomic 27.75 Physiological and Spiritual 25.57 Family 30

## 2024-10-23 ENCOUNTER — Other Ambulatory Visit: Payer: Self-pay | Admitting: Cardiovascular Disease

## 2024-10-25 MED ORDER — SACUBITRIL-VALSARTAN 24-26 MG PO TABS: 1.0000 | ORAL_TABLET | Freq: Two times a day (BID) | ORAL | 3 refills | Status: AC

## 2024-10-30 ENCOUNTER — Ambulatory Visit (HOSPITAL_BASED_OUTPATIENT_CLINIC_OR_DEPARTMENT_OTHER)
Admission: RE | Admit: 2024-10-30 | Discharge: 2024-10-30 | Disposition: A | Discharge status: Home / Self Care | Source: Ambulatory Visit | Attending: Cardiology | Admitting: Cardiology

## 2024-10-30 ENCOUNTER — Ambulatory Visit (HOSPITAL_COMMUNITY)
Admission: RE | Admit: 2024-10-30 | Discharge: 2024-10-30 | Disposition: A | Discharge status: Home / Self Care | Source: Ambulatory Visit | Attending: Cardiology | Admitting: Cardiology

## 2024-10-30 VITALS — BP 102/78 | HR 69 | Wt 288.0 lb

## 2024-10-30 DIAGNOSIS — I2510 Atherosclerotic heart disease of native coronary artery without angina pectoris: Secondary | ICD-10-CM | POA: Insufficient documentation

## 2024-10-30 DIAGNOSIS — F064 Anxiety disorder due to known physiological condition: Secondary | ICD-10-CM | POA: Diagnosis not present

## 2024-10-30 DIAGNOSIS — I480 Paroxysmal atrial fibrillation: Secondary | ICD-10-CM

## 2024-10-30 DIAGNOSIS — I4891 Unspecified atrial fibrillation: Secondary | ICD-10-CM | POA: Insufficient documentation

## 2024-10-30 DIAGNOSIS — Z7901 Long term (current) use of anticoagulants: Secondary | ICD-10-CM | POA: Insufficient documentation

## 2024-10-30 DIAGNOSIS — Z79899 Other long term (current) drug therapy: Secondary | ICD-10-CM | POA: Insufficient documentation

## 2024-10-30 DIAGNOSIS — I5022 Chronic systolic (congestive) heart failure: Secondary | ICD-10-CM | POA: Diagnosis present

## 2024-10-30 DIAGNOSIS — I341 Nonrheumatic mitral (valve) prolapse: Secondary | ICD-10-CM | POA: Diagnosis not present

## 2024-10-30 DIAGNOSIS — Z9861 Coronary angioplasty status: Secondary | ICD-10-CM | POA: Diagnosis not present

## 2024-10-30 DIAGNOSIS — I429 Cardiomyopathy, unspecified: Secondary | ICD-10-CM | POA: Insufficient documentation

## 2024-10-30 DIAGNOSIS — Z7982 Long term (current) use of aspirin: Secondary | ICD-10-CM | POA: Diagnosis not present

## 2024-10-30 DIAGNOSIS — I5032 Chronic diastolic (congestive) heart failure: Secondary | ICD-10-CM

## 2024-10-30 DIAGNOSIS — N1831 Chronic kidney disease, stage 3a: Secondary | ICD-10-CM | POA: Diagnosis not present

## 2024-10-30 DIAGNOSIS — I351 Nonrheumatic aortic (valve) insufficiency: Secondary | ICD-10-CM | POA: Diagnosis not present

## 2024-10-30 DIAGNOSIS — I252 Old myocardial infarction: Secondary | ICD-10-CM | POA: Insufficient documentation

## 2024-10-30 DIAGNOSIS — G629 Polyneuropathy, unspecified: Secondary | ICD-10-CM | POA: Insufficient documentation

## 2024-10-30 LAB — CBC
HCT: 44.7 % (ref 39.0–52.0)
Hemoglobin: 14.7 g/dL (ref 13.0–17.0)
MCH: 30.3 pg (ref 26.0–34.0)
MCHC: 32.9 g/dL (ref 30.0–36.0)
MCV: 92.2 fL (ref 80.0–100.0)
Platelets: 331 K/uL (ref 150–400)
RBC: 4.85 MIL/uL (ref 4.22–5.81)
RDW: 16.4 % — ABNORMAL HIGH (ref 11.5–15.5)
WBC: 6.2 K/uL (ref 4.0–10.5)
nRBC: 0 % (ref 0.0–0.2)

## 2024-10-30 LAB — BASIC METABOLIC PANEL WITH GFR
Anion gap: 11 (ref 5–15)
BUN: 17 mg/dL (ref 6–20)
CO2: 25 mmol/L (ref 22–32)
Calcium: 9.5 mg/dL (ref 8.9–10.3)
Chloride: 106 mmol/L (ref 98–111)
Creatinine, Ser: 1.43 mg/dL — ABNORMAL HIGH (ref 0.61–1.24)
GFR, Estimated: 56 mL/min — ABNORMAL LOW (ref >60)
Glucose, Bld: 133 mg/dL — ABNORMAL HIGH (ref 70–99)
Potassium: 4.9 mmol/L (ref 3.5–5.1)
Sodium: 142 mmol/L (ref 135–145)

## 2024-10-30 LAB — BRAIN NATRIURETIC PEPTIDE: B Natriuretic Peptide: 167.1 pg/mL — ABNORMAL HIGH (ref 0.0–100.0)

## 2024-10-30 LAB — ECHOCARDIOGRAM COMPLETE
Area-P 1/2: 3.27 cm2
Calc EF: 40.3 %
S' Lateral: 4.5 cm
Single Plane A2C EF: 44 %
Single Plane A4C EF: 37.2 %

## 2024-10-30 LAB — VITAMIN B12: Vitamin B-12: 296 pg/mL (ref 180–914)

## 2024-10-30 MED ORDER — PERFLUTREN LIPID MICROSPHERE
1.0000 mL | INTRAVENOUS | Status: DC | PRN
  Administered 2024-10-30: 2 mL via INTRAVENOUS

## 2024-10-30 NOTE — Patient Instructions (Addendum)
 STOP Digoxin    Labs done today, your results will be available in MyChart, we will contact you for abnormal readings.  You have been referred to Electrophysiologist. They will call you to arrange your appointment.  Your provider has ordered a sleep study test for you. You will be called to have this test arranged.  Your physician has requested that you have a cardiac MRI. Cardiac MRI uses a computer to create images of your heart as its beating, producing both still and moving pictures of your heart and major blood vessels. For further information please visit [email or web address]. Please follow the instruction sheet given to you today for more information.  Your physician recommends that you schedule a follow-up appointment in: 3 months ( February 2026) ** PLEASE CALL THE OFFICE IN DECEMBER TO ARRANGE YOUR FOLLOW UP APPOINTMENT.**  If you have any questions or concerns before your next appointment please send us  a message through [place] or call our office at [phone].    TO LEAVE A MESSAGE FOR THE NURSE SELECT OPTION 2, PLEASE LEAVE A MESSAGE INCLUDING: YOUR NAME DATE OF BIRTH CALL BACK NUMBER REASON FOR CALL**this is important as we prioritize the call backs  YOU WILL RECEIVE A CALL BACK THE SAME DAY AS LONG AS YOU CALL BEFORE 4:00 PM  At the [facility], you and your health needs are our priority. As part of our continuing mission to provide you with exceptional heart care, we have created designated Provider Care Teams. These Care Teams include your primary Cardiologist (physician) and Advanced Practice Providers (APPs- Physician Assistants and Nurse Practitioners) who all work together to provide you with the care you need, when you need it.   You may see any of the following providers on your designated Care Team at your next follow up: Dr Toribio Fuel Dr Ezra Shuck Dr. Morene Brownie Greig Mosses, NP Caffie Shed, [place] [facility] Nadine, [place] Beckey Coe, NP Jordan Lee, NP Ellouise Class, NP Tinnie Redman, PharmD Jaun Bash, PharmD   Please be sure to bring in all your medications bottles to every appointment.    Thank you for choosing [facility]

## 2024-10-30 NOTE — Progress Notes (Addendum)
 ADVANCED HEART FAILURE FOLLOW UP CLINIC NOTE  Referring Physician: [facility]  Primary Care: [facility] Primary Cardiologist:  HPI: Andrew Brock is a 63 y.o. male who presents for follow up of chronic systolic heart faliure.      Presented to [facility] as STEMI to LAD 06/28/24. Emergent PCI complicated by distal LAD perforation s/p balloon tamponade. Subsequently developed cardiogenic shock and tamponade s/p inotrope support and pericardiocentesis. Developed atrial fibrillation RVR and self converted on IV amio. Echo showed severe biventricular heart failure with EF 20-25% with severely reduced RV. Eliquis  started prior to discharge.      SUBJECTIVE:  Patient reports that overall he is doing fairly well.  Continues to have a lot of anxiety surrounding the event and questions why he developed heart failure symptoms to begin with.  We discussed his trajectory, likely embolic LAD infarct, and his echocardiogram today which shows improved ejection fraction, though apical akinesis.  Discussed his atrial fibrillation and patient would like to be considered for ablation given his history and ongoing high risk medication use with amiodarone .  Does report some bilateral neuropathy symptoms.  PMH, current medications, allergies, social history, and family history reviewed in epic.  PHYSICAL EXAM: Vitals:   10/30/24 0842  BP: 102/78  Pulse: 69  SpO2: 97%   GENERAL: Well nourished and in no apparent distress at rest.  PULM:  Normal work of breathing, clear to auscultation bilaterally. Respirations are unlabored.  CARDIAC:  JVP: Flat         Normal rate with regular rhythm. No murmurs, rubs or gallops.  Trace edema. Warm and well perfused extremities. ABDOMEN: Soft, non-tender, non-distended. NEUROLOGIC: Patient is oriented x3 with no focal or lateralizing neurologic deficits.    DATA REVIEW  ECG: 07/2024: Normal sinus rhythm, left anterior fascicular block, normal  QRS  ECHO: 06/29/2024: LVEF less than 20%, mildly dilated, RV systolic function moderately reduced, worsening pericardial effusion 10/30/2024: LVEF 35 to 40%, normal RV, mild pericardial effusion  CATH: 06/28/2024: Distal LAD infarct with balloon angioplasty complicated by apical LAD perforation   ASSESSMENT & PLAN:  Chronic systolic heart failure: New diagnosis in the setting of STEMI, suspect largely nonischemic given normal coronary arteries otherwise and LV dilation. - Improvement in ejection fraction to around 35%, discussed cardiac MRI for assistance with etiology, minimal risk factors for infiltrative disease - Stop digoxin  - Improved NYHA class II symptoms, euvolemic - Continue Jardiance  10 mg daily, Entresto  24/26 mg twice daily, spironolactone  25 mg daily - Continue metoprolol  25 mg daily - Limited further titration with heart rate and blood pressure - Continue torsemide  20 mg as needed  Coronary artery disease: Suspect embolic given appearance and coronary angiogram. - Continue apixaban  5 mg twice daily and aspirin  81 mg daily for now - Improved ejection fraction with no clearly seen apical thrombus, however given history will wait for CMR - Repeat labs ordered today  Atrial fibrillation: Multiple events while inpatient, difficult to control, though while on inotropes.  Currently normal sinus rhythm, on amiodarone .  Discussed potentially stopping a medication given clinical improvement, however he would like to discuss ablation. - Refer to electrophysiology for consideration of A-fib ablation given chronic systolic heart failure - Sleep study ordered - Continue amiodarone  200 mg daily and apixaban   Chronic kidney disease: Stage IIIa. - SGLT2 as above  Neuropathy: Bilateral, worse with prolonged sitting, no red flag symptoms. - Suspect related to sciatica/back pain -No other concerning features for cardiac amyloid - Vitamin  B12 level  Follow up in 3 months  I spent 45  minutes caring for this patient today including face to face time, ordering and reviewing labs, reviewing records from his hospital stay and multiple follow ups, seeing the patient, documenting in the record, and arranging follow ups.   Morene Brownie, MD Advanced Heart Failure Mechanical Circulatory Support 10/31/24

## 2024-11-04 ENCOUNTER — Ambulatory Visit (HOSPITAL_COMMUNITY): Payer: Self-pay | Admitting: Cardiology

## 2024-11-19 ENCOUNTER — Encounter (HOSPITAL_COMMUNITY): Payer: Self-pay

## 2024-11-20 ENCOUNTER — Other Ambulatory Visit (HOSPITAL_COMMUNITY): Payer: Self-pay | Admitting: Cardiology

## 2024-11-20 ENCOUNTER — Ambulatory Visit (HOSPITAL_COMMUNITY)
Admission: RE | Admit: 2024-11-20 | Discharge: 2024-11-20 | Disposition: A | Discharge status: Home / Self Care | Source: Ambulatory Visit | Attending: Cardiology | Admitting: Cardiology

## 2024-11-20 DIAGNOSIS — I5032 Chronic diastolic (congestive) heart failure: Secondary | ICD-10-CM | POA: Insufficient documentation

## 2024-11-20 MED ORDER — GADOBUTROL 1 MMOL/ML IV SOLN
16.0000 mL | Freq: Once | INTRAVENOUS | Status: AC | PRN
  Administered 2024-11-20: 16 mL via INTRAVENOUS

## 2024-11-21 ENCOUNTER — Encounter: Payer: Self-pay | Admitting: Cardiovascular Disease

## 2024-11-21 NOTE — Telephone Encounter (Signed)
 Spoke with Andrew Brock regarding his leg numbness and burning sensation. Andrew Brock stated this started in the hospital with his right leg but he is now having these sensations in his left leg as well. Andrew Brock stated his leg is numb from mid thigh to his knees. Andrew Brock stated there is no redness or swelling. Andrew Brock stated he can walk normally but his legs become painful after extended time on his feet. Andrew Brock stated the pain feels both muscular and superficial. Andrew Brock was told to follow up with his primary to rule out a neurological issue but that a message would be sent to Dr. Barbaraann for his suggestions. Andrew Brock stated he has an appointment with his PCP next week. Andrew Brock verbalized understanding. All questions if any were answered.

## 2024-11-23 ENCOUNTER — Encounter: Payer: Self-pay | Admitting: Cardiovascular Disease

## 2024-11-26 ENCOUNTER — Other Ambulatory Visit: Payer: Self-pay

## 2024-11-28 ENCOUNTER — Other Ambulatory Visit: Payer: Self-pay

## 2024-12-09 ENCOUNTER — Other Ambulatory Visit (HOSPITAL_COMMUNITY): Payer: Self-pay

## 2024-12-12 DIAGNOSIS — D8685 Sarcoid myocarditis: Secondary | ICD-10-CM

## 2024-12-12 HISTORY — DX: Sarcoid myocarditis: D86.85

## 2024-12-16 ENCOUNTER — Other Ambulatory Visit (HOSPITAL_COMMUNITY): Payer: Self-pay | Admitting: Internal Medicine

## 2024-12-16 DIAGNOSIS — M5416 Radiculopathy, lumbar region: Secondary | ICD-10-CM

## 2024-12-23 ENCOUNTER — Ambulatory Visit (HOSPITAL_COMMUNITY)
Admission: RE | Admit: 2024-12-23 | Discharge: 2024-12-23 | Disposition: A | Discharge status: Home / Self Care | Source: Ambulatory Visit | Attending: Internal Medicine | Admitting: Internal Medicine

## 2024-12-23 DIAGNOSIS — M5416 Radiculopathy, lumbar region: Secondary | ICD-10-CM

## 2024-12-28 ENCOUNTER — Encounter: Payer: Self-pay | Admitting: Cardiology

## 2025-01-07 ENCOUNTER — Ambulatory Visit (HOSPITAL_COMMUNITY): Payer: Self-pay | Admitting: Cardiology

## 2025-01-07 DIAGNOSIS — I5022 Chronic systolic (congestive) heart failure: Secondary | ICD-10-CM

## 2025-01-07 NOTE — Telephone Encounter (Addendum)
 Pt aware, agreeable, and verbalized understanding  PET Scan ordered and follow up scheduled.  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 Elgie Landino would you be able to arrange for a PET scan and ideally follow up with  me near the end of February?  Will, he was originally sent over for consideration of afib ablation but given his EF, scar burden, and potential sarcoid probably makes sense to proceed with ICD now especially if he needs to go on  immunosuppression in the future.  Thank you all, discussed this at length with him today. Ben

## 2025-01-10 ENCOUNTER — Encounter: Payer: Self-pay | Admitting: Gastroenterology

## 2025-01-10 ENCOUNTER — Ambulatory Visit: Admitting: Gastroenterology

## 2025-01-10 VITALS — BP 102/62 | HR 61 | Ht 69.0 in | Wt 289.1 lb

## 2025-01-10 DIAGNOSIS — I252 Old myocardial infarction: Secondary | ICD-10-CM

## 2025-01-10 DIAGNOSIS — I4891 Unspecified atrial fibrillation: Secondary | ICD-10-CM | POA: Diagnosis not present

## 2025-01-10 DIAGNOSIS — Z1211 Encounter for screening for malignant neoplasm of colon: Secondary | ICD-10-CM | POA: Insufficient documentation

## 2025-01-10 DIAGNOSIS — Z7901 Long term (current) use of anticoagulants: Secondary | ICD-10-CM

## 2025-01-10 NOTE — Progress Notes (Signed)
 "    01/10/2025 Andrew Brock [id number] [birth year]   Discussed the use of AI scribe software for clinical note transcription with the patient, who gave verbal consent to proceed.  History of Present Illness Andrew Brock is a 64 year old male with systolic heart failure, atrial fibrillation, and myocardial infarction in July 2025 who presents for colorectal cancer screening surveillance.  He is new to our office.  His care is being assigned to Dr. Wilhelmenia.  Colonoscopy was last performed five years ago at the [facility], with instructions to repeat in five years. Referral to this facility was made due to his recent myocardial infarction and the need for closer monitoring during the procedure. He reports no current gastrointestinal symptoms.  Eliquis  is taken for atrial fibrillation. Cardiology follow-up is scheduled with Dr. Zenaida to discuss possible ablation. No stent was placed during the recent cardiac event. Echocardiogram in November 2025 showed improvement in heart failure numbers compared to July with EF still low at 35-40%  During his last colonoscopy, he awoke during the procedure and was fully alert, able to converse and observe the procedure. He is unsure of the medications used but believes sedation was insufficient, describing himself as wide awake and could have driven home. The anesthesiologist was distressed by this occurrence.  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 He is currently seeking to see a psychologist and experiences emotional fluctuations. Emotional support is provided by his best friend and older brother, who served as his point of contact during hospitalization.  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   Past Medical History:  Diagnosis Date   Cancer (HCC)    hodgkin lymphoma 2005, treated in [place]   Cardiac sarcoidosis 2026   CHF (congestive heart failure) (HCC)     Coronary artery disease    Diabetes mellitus (HCC)    Hyperlipidemia    Hypertension    Lateral meniscus tear    right knee   Plantar fasciitis    Past Surgical History:  Procedure Laterality Date   ANGIOPLASTY     states just heart cath, no treatment   COLONOSCOPY  2021   CORONARY BALLOON ANGIOPLASTY N/A 06/28/2024   Procedure: CORONARY BALLOON ANGIOPLASTY;  Surgeon: Verlin Lonni JONETTA, MD;  Location: [facility];  Service: Cardiovascular;  Laterality: N/A;   CORONARY/GRAFT ACUTE MI REVASCULARIZATION N/A 06/28/2024   Procedure: Coronary/Graft Acute MI Revascularization;  Surgeon: Verlin Lonni JONETTA, MD;  Location: [facility] CV [facility];  Service: Cardiovascular;  Laterality: N/A;   KNEE ARTHROSCOPY WITH MEDIAL MENISECTOMY Right 07/20/2018   Procedure: RIGHT KNEE ARTHROSCOPY  CHONDROPLASTY, PARTIAL LATERAL MENISECTOMY;  Surgeon: Beverley Evalene JONETTA, MD;  Location: [facility];  Service: Orthopedics;  Laterality: Right;   MR KNEE LEFT [facility] HX)  2024   PERICARDIOCENTESIS N/A 07/01/2024   Procedure: PERICARDIOCENTESIS;  Surgeon: Jordan, Peter M, MD;  Location: [facility] INVASIVE CV LAB;  Service: Cardiovascular;  Laterality: N/A;   RIGHT/LEFT HEART CATH AND CORONARY ANGIOGRAPHY N/A 06/28/2024   Procedure: RIGHT/LEFT HEART CATH AND CORONARY ANGIOGRAPHY;  Surgeon: Verlin Lonni JONETTA, MD;  Location: [facility];  Service: Cardiovascular;  Laterality: N/A;   SHOULDER ARTHROSCOPY     TESTICLE SURGERY     undecended testicle at age 11    reports that he has never smoked. He has never used smokeless tobacco. He reports that he does not drink alcohol and does not use drugs.  family history includes Heart attack in his father; Heart failure in his mother. Allergies[1]    Outpatient Encounter Medications as of 01/10/2025  Medication Sig   albuterol (VENTOLIN HFA) 108 (90 Base) MCG/ACT inhaler Inhale 1 puff into the lungs every 4 (four) hours as needed for shortness  of breath or wheezing.   amiodarone  (PACERONE ) 200 MG tablet Take 1 tablet (200 mg total) by mouth daily.   apixaban  (ELIQUIS ) 5 MG TABS tablet Take 1 tablet (5 mg total) by mouth 2 (two) times daily.   aspirin  EC 81 MG tablet Take 81 mg by mouth daily. Swallow whole.   atorvastatin  (LIPITOR ) 80 MG tablet Take 80 mg by mouth daily.   empagliflozin  (JARDIANCE ) 10 MG TABS tablet Take 1 tablet (10 mg total) by mouth daily.   insulin  glargine (LANTUS ) 100 UNIT/ML injection Inject 34 Units into the skin daily.   metFORMIN (GLUCOPHAGE) 1000 MG tablet Take 1,000 mg by mouth 2 (two) times daily with a meal.   metoprolol  succinate (TOPROL -XL) 25 MG 24 hr tablet Take 1 tablet (25 mg total) by mouth daily. Take with or immediately following a meal.   omeprazole (PRILOSEC) 20 MG capsule Take 20 mg by mouth daily.   sacubitril -valsartan  (ENTRESTO ) 24-26 MG Take 1 tablet by mouth 2 (two) times daily.   spironolactone  (ALDACTONE ) 25 MG tablet Take 1 tablet (25 mg total) by mouth daily.   [DISCONTINUED] torsemide  (DEMADEX ) 20 MG tablet Take 1 tablet (20 mg total) by mouth daily as needed.   No facility-administered encounter medications on file as of 01/10/2025.    REVIEW OF SYSTEMS  : All other systems reviewed and negative except where noted in the History of Present Illness.   PHYSICAL EXAM: BP 102/62   Pulse 61   Ht 5' 9 (1.753 m)   Wt 289 lb 2 oz (131.1 kg)   BMI 42.70 kg/m  General: Well developed AA male in no acute distress Head: Normocephalic and atraumatic Eyes:  Sclerae anicteric, conjunctiva pink. Ears: Normal auditory acuity Musculoskeletal: Symmetrical with no gross deformities  Skin: No lesions on visible extremities Neurological: Alert oriented x 4, grossly non-focal Psychological:  Alert and cooperative. Normal mood and affect  Assessment & Plan Colorectal cancer screening surveillance He is due for surveillance colonoscopy; however, colonoscopy was deferred for approximately  six months due to myocardial infarction/heart failure, ongoing atrial fibrillation management, and current anticoagulation with Eliquis . He remains asymptomatic from a gastrointestinal perspective and agrees with delaying screening until cardiac status is optimized and clearance is obtained. - Deferred colonoscopy for approximately six months due to recent myocardial infarction and ongoing anticoagulation with Eliquis . - Recommended obtaining cardiac clearance prior to scheduling colonoscopy, particularly if ablation for atrial fibrillation is pursued. - Planned follow-up OV in four to six months to reassess cardiac status and reconsider timing of colonoscopy. - Advised him to inform anesthesia of prior inadequate sedation during colonoscopy and discussed use of propofol  for deeper sedation in future procedures. - We will also try to obtain his previous colonoscopy records from the [facility] as well.  CC:  [facility]       [1] No Known Allergies  "

## 2025-01-11 NOTE — Progress Notes (Signed)
 Attending Physician's Attestation   I have reviewed the chart.   I agree with the Advanced Practitioner's note, impression, and recommendations with any updates as below.    Corliss Parish, MD [facility] Advanced Endoscopy Office # [phone]

## 2025-01-14 ENCOUNTER — Encounter (HOSPITAL_COMMUNITY): Payer: Self-pay

## 2025-01-14 ENCOUNTER — Ambulatory Visit (HOSPITAL_BASED_OUTPATIENT_CLINIC_OR_DEPARTMENT_OTHER): Attending: Cardiology | Admitting: Cardiology

## 2025-01-14 DIAGNOSIS — I5032 Chronic diastolic (congestive) heart failure: Secondary | ICD-10-CM

## 2025-01-16 ENCOUNTER — Other Ambulatory Visit (HOSPITAL_COMMUNITY): Payer: Self-pay

## 2025-01-22 ENCOUNTER — Ambulatory Visit: Admitting: Cardiology

## 2025-01-23 ENCOUNTER — Encounter (HOSPITAL_COMMUNITY)

## 2025-02-04 ENCOUNTER — Ambulatory Visit (HOSPITAL_COMMUNITY): Admitting: Cardiology
